# Patient Record
Sex: Male | Born: 1950
Health system: Southern US, Community
[De-identification: ages and names within clinical notes are randomized; demographics above are authoritative.]

## PROBLEM LIST (undated history)

## (undated) DIAGNOSIS — E785 Hyperlipidemia, unspecified: Secondary | ICD-10-CM

## (undated) DIAGNOSIS — M1990 Unspecified osteoarthritis, unspecified site: Secondary | ICD-10-CM

## (undated) DIAGNOSIS — Z9049 Acquired absence of other specified parts of digestive tract: Secondary | ICD-10-CM

## (undated) DIAGNOSIS — N138 Other obstructive and reflux uropathy: Secondary | ICD-10-CM

## (undated) DIAGNOSIS — Z973 Presence of spectacles and contact lenses: Secondary | ICD-10-CM

## (undated) DIAGNOSIS — I10 Essential (primary) hypertension: Secondary | ICD-10-CM

## (undated) DIAGNOSIS — T7840XA Allergy, unspecified, initial encounter: Secondary | ICD-10-CM

## (undated) DIAGNOSIS — F419 Anxiety disorder, unspecified: Secondary | ICD-10-CM

## (undated) DIAGNOSIS — I6521 Occlusion and stenosis of right carotid artery: Secondary | ICD-10-CM

## (undated) DIAGNOSIS — I739 Peripheral vascular disease, unspecified: Secondary | ICD-10-CM

## (undated) DIAGNOSIS — N401 Enlarged prostate with lower urinary tract symptoms: Secondary | ICD-10-CM

## (undated) DIAGNOSIS — N529 Male erectile dysfunction, unspecified: Secondary | ICD-10-CM

## (undated) DIAGNOSIS — G43909 Migraine, unspecified, not intractable, without status migrainosus: Secondary | ICD-10-CM

## (undated) DIAGNOSIS — M542 Cervicalgia: Secondary | ICD-10-CM

## (undated) HISTORY — DX: Anxiety disorder, unspecified: F41.9

## (undated) HISTORY — PX: COLON SURGERY: SHX602

## (undated) HISTORY — DX: Essential (primary) hypertension: I10

## (undated) HISTORY — PX: ENDOVASCULAR STENT INSERTION: SHX5161

## (undated) HISTORY — DX: Hyperlipidemia, unspecified: E78.5

## (undated) HISTORY — PX: PARTIAL COLECTOMY: SHX5273

## (undated) HISTORY — DX: Allergy, unspecified, initial encounter: T78.40XA

## (undated) HISTORY — PX: PILONIDAL CYST EXCISION: SHX744

## (undated) HISTORY — DX: Unspecified osteoarthritis, unspecified site: M19.90

## (undated) HISTORY — PX: COLOSTOMY REVERSAL: SHX5782

---

## 2010-12-20 HISTORY — PX: TOTAL KNEE ARTHROPLASTY: SHX125

## 2012-07-13 DIAGNOSIS — M1990 Unspecified osteoarthritis, unspecified site: Secondary | ICD-10-CM | POA: Insufficient documentation

## 2015-12-21 HISTORY — PX: PARTIAL KNEE ARTHROPLASTY: SHX2174

## 2017-01-26 LAB — HM COLONOSCOPY

## 2019-05-21 ENCOUNTER — Telehealth: Payer: Self-pay | Admitting: *Deleted

## 2019-05-21 NOTE — Telephone Encounter (Signed)
Copied from CRM [id number]. Topic: Appointment Scheduling - Scheduling Inquiry for Clinic >> May 21, 2019  1:26 PM Lorayne Bender wrote: Reason for CRM:   Pt new to area and would like to establish with PCP.  No preference of provider.  Pt has [employer].  Tried office 3x.

## 2019-05-24 ENCOUNTER — Ambulatory Visit (INDEPENDENT_AMBULATORY_CARE_PROVIDER_SITE_OTHER): Payer: PPO | Admitting: Family Medicine

## 2019-05-24 ENCOUNTER — Encounter: Payer: Self-pay | Admitting: Family Medicine

## 2019-05-24 DIAGNOSIS — E785 Hyperlipidemia, unspecified: Secondary | ICD-10-CM | POA: Diagnosis not present

## 2019-05-24 DIAGNOSIS — I10 Essential (primary) hypertension: Secondary | ICD-10-CM

## 2019-05-24 DIAGNOSIS — N400 Enlarged prostate without lower urinary tract symptoms: Secondary | ICD-10-CM | POA: Diagnosis not present

## 2019-05-24 DIAGNOSIS — L219 Seborrheic dermatitis, unspecified: Secondary | ICD-10-CM | POA: Diagnosis not present

## 2019-05-24 DIAGNOSIS — F419 Anxiety disorder, unspecified: Secondary | ICD-10-CM

## 2019-05-24 MED ORDER — FINASTERIDE 5 MG PO TABS: 5.0000 mg | ORAL_TABLET | Freq: Every day | ORAL | 1 refills | Status: DC

## 2019-05-24 MED ORDER — AMLODIPINE BESYLATE 10 MG PO TABS: 10.0000 mg | ORAL_TABLET | Freq: Every day | ORAL | 1 refills | Status: DC

## 2019-05-24 MED ORDER — TADALAFIL 20 MG PO TABS: 5.0000 mg | ORAL_TABLET | Freq: Every day | ORAL | 3 refills | Status: DC

## 2019-05-24 MED ORDER — TAMSULOSIN HCL 0.4 MG PO CAPS: 0.4000 mg | ORAL_CAPSULE | Freq: Every day | ORAL | 1 refills | Status: DC

## 2019-05-24 MED ORDER — ATORVASTATIN CALCIUM 40 MG PO TABS: 40.0000 mg | ORAL_TABLET | Freq: Every day | ORAL | 1 refills | Status: DC

## 2019-05-24 MED ORDER — TRIAMCINOLONE ACETONIDE 0.5 % EX CREA: 1.0000 "application " | TOPICAL_CREAM | Freq: Three times a day (TID) | CUTANEOUS | 1 refills | Status: DC

## 2019-05-24 MED ORDER — OXYBUTYNIN CHLORIDE ER 10 MG PO TB24: 10.0000 mg | ORAL_TABLET | Freq: Every day | ORAL | 1 refills | Status: DC

## 2019-05-24 MED ORDER — DOXEPIN HCL 25 MG PO CAPS: 25.0000 mg | ORAL_CAPSULE | Freq: Every day | ORAL | 1 refills | Status: DC

## 2019-05-24 NOTE — Assessment & Plan Note (Signed)
Stable.  Continue doxepin 25 mg daily.  Discussed potential changing agent however given its stability for several years, will continue for the time being.

## 2019-05-24 NOTE — Assessment & Plan Note (Signed)
Stable.  Continue triamcinolone as needed. 

## 2019-05-24 NOTE — Assessment & Plan Note (Signed)
Stable.  Continue Lipitor 40 mg daily.  Check lipid panel next blood draw. 

## 2019-05-24 NOTE — Assessment & Plan Note (Signed)
Symptoms are poorly controlled.  Continue current regimen of Flomax 0.4 mg daily, Proscar 5 mg daily, Cialis 5 mg daily, and oxybutynin 10 mg daily.  Will place referral to urology per patient request.

## 2019-05-24 NOTE — Assessment & Plan Note (Signed)
Stable.  Continue Norvasc 10 mg daily. 

## 2019-05-24 NOTE — Progress Notes (Signed)
Chief Complaint:  Jim Mathis is a 68 y.o. male who presents today for a virtual office visit with a chief complaint of BPH and to establish care.   Assessment/Plan:  BPH with Jim and OAB Symptoms are poorly controlled.  Continue current regimen of Flomax 0.4 mg daily, Proscar 5 mg daily, Cialis 5 mg daily, and oxybutynin 10 mg daily.  Will place referral to urology per patient request.  Essential hypertension Stable.  Continue Norvasc 10 mg daily.  Dyslipidemia Stable.  Continue Lipitor 40 mg daily.  Check lipid panel next blood draw.  Anxiety Stable.  Continue doxepin 25 mg daily.  Discussed potential changing agent however given its stability for several years, will continue for the time being.  Seborrheic dermatitis Stable.  Continue triamcinolone as needed.  Preventative Healthcare Patient was instructed to return soon for CPE. Health Maintenance Due  Topic Date Due  . Hepatitis C Screening  1951/09/01  . COLONOSCOPY  09/20/2000  . PNA vac Low Risk Adult (1 of 2 - PCV13) 09/21/2015        Subjective:  HPI:  His stable, chronic medical conditions are outlined below:  # Essential Hypertension - On Norvasc 10 mg daily tolerating well. - Home BPs: 130s/70s.  - ROS: No reported chest pain or shortness of breath  #Dyslipidemia -On Lipitor 40 mg daily and tolerating well -ROS: No reported myalgias  #BPH with Jim and OAB -On Flomax 0.4 mg daily, Cialis 5 mg daily, oxybutynin 10mg ,  and finasteride 5 mg daily. -Still has significant difficulty with urination.  Wakes up 5-6 times per night.  Interested in urological referral.  # Anxiety - On doxepin 25mg  nightly and tolerating well - ROS: No reported SI or HI.  #Seborrheic dermatitis - Uses triamcinolone cream as needed for outbreaks.  ROS: , otherwise a complete review of systems was negative.   PMH:  The following were reviewed and entered/updated in epic: Past Medical History:  Diagnosis Date  . BPH  (benign prostatic hyperplasia)   . Hyperlipidemia   . Hypertension    Patient Active Problem List   Diagnosis Date Noted  . BPH with Jim and OAB 05/24/2019  . Essential hypertension 05/24/2019  . Dyslipidemia 05/24/2019  . Anxiety 05/24/2019  . Seborrheic dermatitis 05/24/2019   Past Surgical History:  Procedure Laterality Date  . JOINT REPLACEMENT    . PARTIAL KNEE ARTHROPLASTY Left 2017  . REPLACEMENT TOTAL KNEE Right 2012  . SMALL INTESTINE SURGERY     s/p knee replacement    Family History  Problem Relation Age of Onset  . Alzheimer's disease Mother   . Lung cancer Father        non smoker   Medications- reviewed and updated Current Outpatient Medications  Medication Sig Dispense Refill  . amLODipine (NORVASC) 10 MG tablet Take 1 tablet (10 mg total) by mouth daily. 90 tablet 1  . amoxicillin (AMOXIL) 500 MG capsule Take 2,000mg  one hour prior to dental procedure    . aspirin EC 81 MG tablet Take 81 mg by mouth daily.    Marland Kitchen atorvastatin (LIPITOR) 40 MG tablet Take 1 tablet (40 mg total) by mouth daily. 90 tablet 1  . doxepin (SINEQUAN) 25 MG capsule Take 1 capsule (25 mg total) by mouth daily. 90 capsule 1  . finasteride (PROSCAR) 5 MG tablet Take 1 tablet (5 mg total) by mouth daily. 90 tablet 1  . oxybutynin (DITROPAN-XL) 10 MG 24 hr tablet Take 1 tablet (10 mg total)  by mouth at bedtime. 90 tablet 1  . tadalafil (CIALIS) 20 MG tablet Take 0.5 tablets (10 mg total) by mouth daily. 90 tablet 3  . tamsulosin (FLOMAX) 0.4 MG CAPS capsule Take 1 capsule (0.4 mg total) by mouth daily. 90 capsule 1  . triamcinolone cream (KENALOG) 0.5 % Apply 1 application topically 3 (three) times daily. Rash on chest 30 g 1   No current facility-administered medications for this visit.     Allergies-reviewed and updated No Known Allergies  Social History   Socioeconomic History  . Marital status: Married    Spouse name: Not on file  . Number of children: Not on file  . Years of  education: Not on file  . Highest education level: Not on file  Occupational History  . Not on file  Social Needs  . Financial resource strain: Not on file  . Food insecurity:    Worry: Not on file    Inability: Not on file  . Transportation needs:    Medical: Not on file    Non-medical: Not on file  Tobacco Use  . Smoking status: Former Smoker    Last attempt to quit: 05/23/1994    Years since quitting: 25.0  . Smokeless tobacco: Never Used  Substance and Sexual Activity  . Alcohol use: Yes    Comment: occasionally  . Drug use: Never  . Sexual activity: Yes  Lifestyle  . Physical activity:    Days per week: 4 days    Minutes per session: 40 min  . Stress: Not on file  Relationships  . Social connections:    Talks on phone: Not on file    Gets together: Not on file    Attends religious service: Not on file    Active member of club or organization: Not on file    Attends meetings of clubs or organizations: Not on file    Relationship status: Not on file  Other Topics Concern  . Not on file  Social History Narrative  . Not on file           Objective/Observations  Physical Exam: Gen: NAD, resting comfortably Eyes: Extraocular eye movements intact.  No scleral icterus Cardiovascular: No peripheral edema Pulm: Normal work of breathing MSK: Full range of motion in upper extremities.  No digital cyanosis. Skin: No rashes or lesions. Neuro: Grossly normal, moves all extremities Psych: Normal affect and thought content  Virtual Visit via Video   I connected with Jim Mathis on 05/24/19 at  3:00 PM EDT by a video enabled telemedicine application and verified that I am speaking with the correct person using two identifiers. I discussed the limitations of evaluation and management by telemedicine and the availability of in person appointments. The patient expressed understanding and agreed to proceed.   Patient location: Home Provider location: [place]  Office Persons participating in the virtual visit: Myself and Patient     Katina DegreeCaleb M. Jimmey RalphParker, MD 05/24/2019 4:31 PM

## 2019-05-28 ENCOUNTER — Encounter: Payer: Self-pay | Admitting: Family Medicine

## 2019-05-29 NOTE — Telephone Encounter (Signed)
Mr. Parodi called office to schedule an appt but office was closed. / please advise

## 2019-05-30 ENCOUNTER — Encounter: Payer: Self-pay | Admitting: Family Medicine

## 2019-05-30 ENCOUNTER — Ambulatory Visit (INDEPENDENT_AMBULATORY_CARE_PROVIDER_SITE_OTHER): Payer: PPO | Admitting: Family Medicine

## 2019-05-30 ENCOUNTER — Other Ambulatory Visit: Payer: Self-pay

## 2019-05-30 DIAGNOSIS — R51 Headache: Secondary | ICD-10-CM | POA: Diagnosis not present

## 2019-05-30 DIAGNOSIS — R200 Anesthesia of skin: Secondary | ICD-10-CM

## 2019-05-30 DIAGNOSIS — R519 Headache, unspecified: Secondary | ICD-10-CM

## 2019-05-30 MED ORDER — OXYBUTYNIN CHLORIDE ER 10 MG PO TB24: 10.0000 mg | ORAL_TABLET | Freq: Every day | ORAL | 1 refills | Status: DC

## 2019-05-30 MED ORDER — FINASTERIDE 5 MG PO TABS: 5.0000 mg | ORAL_TABLET | Freq: Every day | ORAL | 1 refills | Status: DC

## 2019-05-30 MED ORDER — ATORVASTATIN CALCIUM 40 MG PO TABS: 40.0000 mg | ORAL_TABLET | Freq: Every day | ORAL | 1 refills | Status: DC

## 2019-05-30 MED ORDER — AMLODIPINE BESYLATE 10 MG PO TABS: 10.0000 mg | ORAL_TABLET | Freq: Every day | ORAL | 1 refills | Status: DC

## 2019-05-30 MED ORDER — TRIAMCINOLONE ACETONIDE 0.5 % EX CREA: 1.0000 "application " | TOPICAL_CREAM | Freq: Three times a day (TID) | CUTANEOUS | 1 refills | Status: DC

## 2019-05-30 MED ORDER — DOXEPIN HCL 25 MG PO CAPS: 25.0000 mg | ORAL_CAPSULE | Freq: Every day | ORAL | 1 refills | Status: DC

## 2019-05-30 MED ORDER — TAMSULOSIN HCL 0.4 MG PO CAPS: 0.4000 mg | ORAL_CAPSULE | Freq: Every day | ORAL | 1 refills | Status: DC

## 2019-05-30 NOTE — Progress Notes (Signed)
° ° °  Chief Complaint:  Jim Mathis is a 68 y.o. male who presents today for a virtual office visit with a chief complaint of headache.   Assessment/Plan:  Headache / Right Upper extremity numbness. These are both chronic problems, new to provider. Possibly consistent with complex migraine given his concurrent blurred vision and history of migraines in the past.  He also has some associated right upper extremity numbness/tingling and right lower extremity numbness/tingling.  He has had a carotid ultrasound done which was negative.  Has also had some x-rays done which reportedly showed degenerative changes in his neck with some nerve compression.  It is possible that he could have some degenerative changes in his neck which is contributing to his headaches/migraines.  Discussed management options with patient at this point including imaging and referral to neurology.  Patient preferred neurology referral.  This is placed today.  Discussed reasons to return to care and seek emergent care.     Subjective:  HPI:  Headaches Symptoms started about 3 years ago.  He has been seen by his previous PCP for this and has had work-up including a few x-rays and a carotid ultrasound.  Describes his headaches as mild.  They originate in the back of his head and then radiate towards the front.  They tend to be constant in nature.  He has had migraines in the past and does not feel like his headaches are as severe as his previous migraines.  He has had some associated peripheral blurred vision with his headaches.  Symptoms last pretty much all day and then subside.  He has taken high-dose naproxen which seems to help.  He has also had some associated numbness and tingling in his right upper extremity and right lower extremity for the past few years as well.  These symptoms only occur with exertion.  These were not associated with his headaches.  He will go for brisk walk and start having symptoms.  Symptoms then subside  after coming to rest.  No obvious precipitating events.  No other obvious alleviating or aggravating factors.  Symptoms are becoming more frequent.  His right lower extremity numbness became more frequent after undergoing total knee replacement a few years ago.  Now has them occur 4-6 times per month.   ROS: Per HPI  PMH: He reports that he quit smoking about 25 years ago. He has never used smokeless tobacco. He reports current alcohol use. He reports that he does not use drugs.      Objective/Observations  Physical Exam: Gen: NAD, resting comfortably Pulm: Normal work of breathing Neuro: Grossly normal, moves all extremities Psych: Normal affect and thought content  Virtual Visit via Video   I connected with Ed Gaige on 05/30/19 at 10:20 AM EDT by a video enabled telemedicine application and verified that I am speaking with the correct person using two identifiers. I discussed the limitations of evaluation and management by telemedicine and the availability of in person appointments. The patient expressed understanding and agreed to proceed.   Patient location: Home Provider location: [place] participating in the virtual visit: Myself and Patient     Algis Greenhouse. Jerline Pain, MD 05/30/2019 10:22 AM

## 2019-06-06 ENCOUNTER — Encounter: Payer: Self-pay | Admitting: Neurology

## 2019-06-06 NOTE — Progress Notes (Signed)
Virtual Visit via Video Note The purpose of this virtual visit is to provide medical care while limiting exposure to the novel coronavirus.    Consent was obtained for video visit:  yes Answered questions that patient had about telehealth interaction:  yes I discussed the limitations, risks, security and privacy concerns of performing an evaluation and management service by telemedicine. I also discussed with the patient that there may be a patient responsible charge related to this service. The patient expressed understanding and agreed to proceed.  Pt location: Home Physician Location: Office Name of referring provider:  Ardith DarkParker, Caleb M, MD I connected with Jim MontesEdward Egle Mathis. at patients initiation/request on 06/07/2019 at  2:50 PM EDT by video enabled telemedicine application and verified that I am speaking with the correct person using two identifiers. Pt MRN:  [id number] Pt DOB:  01/19/1951 Video Participants:  Jim MontesEdward Ewalt Mathis.   History of Present Illness:  Jim Mathis is a 68 year old  Caucasian man who presents for headache and right upper extremity numbness.  History supplemented by referring provider note.  He began having headaches around 2017 and has been getting more severe.  He does report past history of migraines in his 10740s but these headaches are not as severe.  These headaches are 6-7/10.  The start in the back of the head and radiate in band-like distribution, usually non-throbbing.  Headaches are preceded by aggravation of neck pain.  Sometimes they are preceded by a visual "cloud burst" with peripheral vision loss lasting 10-15 minutes.  They typically last a day and have been occurring every other day.  There is some photophobia and phonophobia but no nausea or vomiting.  They are fairly successfully treated with naproxen or acetaminophen.    He has left sided neck pain but will develop numbness in the right hand, predominantly the 4th and 5th digits.  He also  endorses back pain.  When he walks, his right foot becomes numb as well.  No associated radicular pain or weakness.    Workup reportedly included X-rays of the cervical spine which reportedly showed "degenerative changes and "nerve compression"  He had a carotid ultrasound performed in March-February which demonstrated 50-69% stenosis in the right ICA, no hemodynamically significant stenosis in the left ICA.  Incidentally, a 17 mm nodule in the right lobe of the thyroid was seen.  Current NSAIDS:  ASA 81mg  daily, naproxen Current analgesics:  Tylenol Current triptans:  none Current ergotamine:  none Current anti-emetic:  none Current muscle relaxants:  none Current anti-anxiolytic:  none Current sleep aide:  none Current Antihypertensive medications:  amlodipine Current Antidepressant medications:  Doxepin 25mg  Current Anticonvulsant medications:  none Current anti-CGRP:  none Current Vitamins/Herbal/Supplements:  none Current Antihistamines/Decongestants:  none Other therapy:  none Other medications:  atorvastatin  Past NSAIDS:  ibuprofen Past analgesics:  Fiorinal (many years ago for old migraines) Past abortive triptans:  none Past abortive ergotamine:  none Past muscle relaxants:  none Past anti-emetic:  none Past antihypertensive medications:  none Past antidepressant medications:  none Past anticonvulsant medications:  none Past anti-CGRP:  none Past vitamins/Herbal/Supplements:  none Past antihistamines/decongestants:  none Other past therapies:  none  Past Medical History: Past Medical History:  Diagnosis Date  . BPH (benign prostatic hyperplasia)   . Hyperlipidemia   . Hypertension     Medications: Outpatient Encounter Medications as of 06/07/2019  Medication Sig  . amLODipine (NORVASC) 10 MG tablet Take 1 tablet (10 mg total) by mouth  daily.  . amoxicillin (AMOXIL) 500 MG capsule Take 2,000mg  one hour prior to dental procedure  . aspirin EC 81 MG tablet Take  81 mg by mouth daily.  Marland Kitchen atorvastatin (LIPITOR) 40 MG tablet Take 1 tablet (40 mg total) by mouth daily.  Marland Kitchen doxepin (SINEQUAN) 25 MG capsule Take 1 capsule (25 mg total) by mouth daily.  . finasteride (PROSCAR) 5 MG tablet Take 1 tablet (5 mg total) by mouth daily.  Marland Kitchen oxybutynin (DITROPAN-XL) 10 MG 24 hr tablet Take 1 tablet (10 mg total) by mouth at bedtime.  . tadalafil (CIALIS) 20 MG tablet Take 0.5 tablets (10 mg total) by mouth daily.  . tamsulosin (FLOMAX) 0.4 MG CAPS capsule Take 1 capsule (0.4 mg total) by mouth daily.  Marland Kitchen triamcinolone cream (KENALOG) 0.5 % Apply 1 application topically 3 (three) times daily. Rash on chest   No facility-administered encounter medications on file as of 06/07/2019.     Allergies: No Known Allergies  Family History: Family History  Problem Relation Age of Onset  . Alzheimer's disease Mother   . Lung cancer Father        non smoker    Social History: Social History   Socioeconomic History  . Marital status: Married    Spouse name: Not on file  . Number of children: Not on file  . Years of education: Not on file  . Highest education level: Not on file  Occupational History  . Not on file  Social Needs  . Financial resource strain: Not on file  . Food insecurity    Worry: Not on file    Inability: Not on file  . Transportation needs    Medical: Not on file    Non-medical: Not on file  Tobacco Use  . Smoking status: Former Smoker    Quit date: 05/23/1994    Years since quitting: 25.0  . Smokeless tobacco: Never Used  Substance and Sexual Activity  . Alcohol use: Yes    Comment: occasionally  . Drug use: Never  . Sexual activity: Yes  Lifestyle  . Physical activity    Days per week: 4 days    Minutes per session: 40 min  . Stress: Not on file  Relationships  . Social Herbalist on phone: Not on file    Gets together: Not on file    Attends religious service: Not on file    Active member of club or organization: Not  on file    Attends meetings of clubs or organizations: Not on file    Relationship status: Not on file  . Intimate partner violence    Fear of current or ex partner: Not on file    Emotionally abused: Not on file    Physically abused: Not on file    Forced sexual activity: Not on file  Other Topics Concern  . Not on file  Social History Narrative  . Not on file   Observations/Objective:   Height 5\' 10"  (1.778 m), weight 190 lb (86.2 kg). No acute distress.  Alert and oriented.  Speech fluent and not dysarthric.  Language intact.    Assessment and Plan:   1.  Cervicogenic migraines/Chronic migraine without aura, without status migrainosus, not intractable/migraine with aura, without status migrainosus, not intractable 2. Cervicalgia, cervical radiculopathy 3.  Low back pain, lumbar radiculopathy 4.  Right carotid artery stenosis  1.  To address neck pain and headache, start gabapentin: 300mg  at bedtime for a week, then  300mg  twice daily. 2.  At onset of aggravated neck pain, may take tizanidine 2mg  to try and prevent migraine (cautioned about drowsiness).  May use naproxen 3.  Limit use of pain relievers to no more than 2 days out of week to prevent risk of rebound or medication-overuse headache. 4.  Check MRI of cervical spine to evaluate for etiology of neck pain and radiculopathy 5.  NCV-EMG of right upper and lower extremities 6.  Will need to repeat carotid US annually (next in February-March 2021).  Continue ASA 81mg  and atorvastatin daily 7.  Follow up in 4 months.  Follow Up Instructions:    -I discussed the assessment and treatment plan with the patient. The patient was provided an opportunity to ask questions and all were answered. The patient agreed with the plan and demonstrated an understanding of the instructions.   The patient was advised to call back or seek an in-person evaluation if the symptoms worsen or if the condition fails to improve as anticipated.  Cira ServantAdam  Robert Savi Lastinger, DO

## 2019-06-07 ENCOUNTER — Telehealth (INDEPENDENT_AMBULATORY_CARE_PROVIDER_SITE_OTHER): Payer: PPO | Admitting: Neurology

## 2019-06-07 ENCOUNTER — Encounter: Payer: Self-pay | Admitting: Neurology

## 2019-06-07 ENCOUNTER — Other Ambulatory Visit: Payer: Self-pay

## 2019-06-07 VITALS — Ht 70.0 in | Wt 190.0 lb

## 2019-06-07 DIAGNOSIS — G43109 Migraine with aura, not intractable, without status migrainosus: Secondary | ICD-10-CM

## 2019-06-07 DIAGNOSIS — G43709 Chronic migraine without aura, not intractable, without status migrainosus: Secondary | ICD-10-CM

## 2019-06-07 DIAGNOSIS — I6521 Occlusion and stenosis of right carotid artery: Secondary | ICD-10-CM

## 2019-06-07 DIAGNOSIS — M542 Cervicalgia: Secondary | ICD-10-CM

## 2019-06-07 DIAGNOSIS — M5416 Radiculopathy, lumbar region: Secondary | ICD-10-CM

## 2019-06-07 DIAGNOSIS — M5412 Radiculopathy, cervical region: Secondary | ICD-10-CM

## 2019-06-07 MED ORDER — TIZANIDINE HCL 2 MG PO TABS: 2.0000 mg | ORAL_TABLET | Freq: Four times a day (QID) | ORAL | 0 refills | Status: DC | PRN

## 2019-06-07 MED ORDER — GABAPENTIN 300 MG PO CAPS: ORAL_CAPSULE | ORAL | 0 refills | Status: DC

## 2019-06-07 NOTE — Addendum Note (Signed)
Addended byTomi Likens, Sherlon Nied R on: 06/07/2019 03:15 PM   Modules accepted: Orders

## 2019-06-07 NOTE — Addendum Note (Signed)
Addended by: Clois Comber on: 06/07/2019 03:23 PM   Modules accepted: Orders

## 2019-06-20 ENCOUNTER — Encounter: Payer: Self-pay | Admitting: Family Medicine

## 2019-07-02 ENCOUNTER — Ambulatory Visit
Admission: RE | Admit: 2019-07-02 | Discharge: 2019-07-02 | Disposition: A | Discharge status: Home / Self Care | Payer: PPO | Source: Ambulatory Visit | Attending: Neurology | Admitting: Neurology

## 2019-07-02 ENCOUNTER — Other Ambulatory Visit: Payer: Self-pay

## 2019-07-02 DIAGNOSIS — M452 Ankylosing spondylitis of cervical region: Secondary | ICD-10-CM | POA: Diagnosis not present

## 2019-07-02 DIAGNOSIS — M5412 Radiculopathy, cervical region: Secondary | ICD-10-CM

## 2019-07-02 DIAGNOSIS — M542 Cervicalgia: Secondary | ICD-10-CM

## 2019-07-03 ENCOUNTER — Ambulatory Visit (INDEPENDENT_AMBULATORY_CARE_PROVIDER_SITE_OTHER): Payer: PPO | Admitting: Neurology

## 2019-07-03 DIAGNOSIS — G5621 Lesion of ulnar nerve, right upper limb: Secondary | ICD-10-CM

## 2019-07-03 DIAGNOSIS — M5412 Radiculopathy, cervical region: Secondary | ICD-10-CM | POA: Diagnosis not present

## 2019-07-03 DIAGNOSIS — G5601 Carpal tunnel syndrome, right upper limb: Secondary | ICD-10-CM

## 2019-07-03 NOTE — Procedures (Addendum)
[facility]  [address]  [place], [place] [address] Tel: [phone] Fax:  [phone] Test Date:  07/03/2019  Patient: Jim Mathis, Jim Mathis. DOB: [birth year] Physician: Narda Amber, DO  Sex: Male Height: 5\' 10"  Ref Phys: Metta Clines, DO  ID#: [id number] Temp: 33.0C Technician:    Patient Complaints: This is a 68 year old man referred for evaluation of right fourth and fifth finger numbness and episodic right foot numbness.  NCV & EMG Findings: Extensive electrodiagnostic testing of the right upper and lower extremities shows: 1. Right mixed palmar sensory responses show mildly prolonged latency.  Right median, ulnar, sural, and superficial peroneal sensory responses are within normal limits. 2. Right ulnar motor response shows slowed conduction velocity across the elbow (A Elbow-B Elbow, 45 m/s).  Right median, peroneal, and tibial motor responses are within normal limits.   3. Right tibial H reflex study is within normal limits.   4. There is no evidence of active or chronic motor axonal loss changes affecting any of the tested muscles.  Motor unit configuration and recruitment pattern is within normal limits.    Impression: 1. Right ulnar neuropathy with slowing across the elbow, purely demyelinating in type and mild in degree electrically. 2. Right median neuropathy at or distal to the wrist (very mild), consistent with a clinical diagnosis of carpal tunnel syndrome. 3. There is no evidence of a cervical/lumbosacral radiculopathy or sensorimotor polyneuropathy affecting the right side.   ___________________________ Narda Amber, DO    Nerve Conduction Studies Anti Sensory Summary Table   Site NR Peak (ms) Norm Peak (ms) P-T Amp (V) Norm P-T Amp  Right Median Anti Sensory (2nd Digit)  33C  Wrist    3.0 <3.8 16.8 >10  Right Sup Peroneal Anti Sensory (Ant Lat Mall)  33C  12 cm    2.1 <4.6 10.4 >3  Right Sural Anti Sensory (Lat Mall)  33C  Calf     2.6 <4.6 11.6 >3  Right Ulnar Anti Sensory (5th Digit)  33C  Wrist    2.4 <3.2 24.7 >5   Motor Summary Table   Site NR Onset (ms) Norm Onset (ms) O-P Amp (mV) Norm O-P Amp Site1 Site2 Delta-0 (ms) Dist (cm) Vel (m/s) Norm Vel (m/s)  Right Median Motor (Abd Poll Brev)  33C  Wrist    2.5 <4.0 10.9 >5 Elbow Wrist 5.3 31.0 58 >50  Elbow    7.8  10.1         Right Peroneal Motor (Ext Dig Brev)  33C  Ankle    2.8 <6.0 3.5 >2.5 B Fib Ankle 7.8 39.0 50 >40  B Fib    10.6  3.0  Poplt B Fib 1.5 9.0 60 >40  Poplt    12.1  2.8         Right Tibial Motor (Abd Hall Brev)  33C  Ankle    4.2 <6.0 9.8 >4 Knee Ankle 8.1 42.0 52 >40  Knee    12.3  5.7         Right Ulnar Motor (Abd Dig Minimi)  33C  Wrist    2.0 <3.1 8.0 >7 B Elbow Wrist 3.7 23.0 62 >50  B Elbow    5.7  7.2  A Elbow B Elbow 2.2 10.0 45 >50  A Elbow    7.9  6.8          Comparison Summary Table   Site NR Peak (ms) Norm Peak (ms) P-T Amp (V) Site1 Site2  Delta-P (ms) Norm Delta (ms)  Right Median/Ulnar Palm Comparison (Wrist - 8cm)  33C  Median Palm    1.8 <2.2 18.1 Median Palm Ulnar Palm 0.5   Ulnar Palm    1.3 <2.2 12.5       H Reflex Studies   NR H-Lat (ms) Lat Norm (ms) L-R H-Lat (ms)  Right Tibial (Gastroc)  33C     34.01 <35    EMG   Side Muscle Ins Act Fibs Psw Fasc Number Recrt Dur Dur. Amp Amp. Poly Poly. Comment  Right AntTibialis Nml Nml Nml Nml Nml Nml Nml Nml Nml Nml Nml Nml N/A  Right Gastroc Nml Nml Nml Nml Nml Nml Nml Nml Nml Nml Nml Nml N/A  Right Flex Dig Long Nml Nml Nml Nml Nml Nml Nml Nml Nml Nml Nml Nml N/A  Right RectFemoris Nml Nml Nml Nml Nml Nml Nml Nml Nml Nml Nml Nml N/A  Right GluteusMed Nml Nml Nml Nml Nml Nml Nml Nml Nml Nml Nml Nml N/A  Right 1stDorInt Nml Nml Nml Nml Nml Nml Nml Nml Nml Nml Nml Nml N/A  Right Abd Poll Brev Nml Nml Nml Nml Nml Nml Nml Nml Nml Nml Nml Nml N/A  Right PronatorTeres Nml Nml Nml Nml Nml Nml Nml Nml Nml Nml Nml Nml N/A  Right Biceps Nml Nml Nml Nml Nml Nml  Nml Nml Nml Nml Nml Nml N/A  Right Triceps Nml Nml Nml Nml Nml Nml Nml Nml Nml Nml Nml Nml N/A  Right Deltoid Nml Nml Nml Nml Nml Nml Nml Nml Nml Nml Nml Nml N/A  Right FlexCarpiUln Nml Nml Nml Nml Nml Nml Nml Nml Nml Nml Nml Nml N/A      Waveforms:

## 2019-07-10 ENCOUNTER — Encounter: Payer: Self-pay | Admitting: Family Medicine

## 2019-07-24 DIAGNOSIS — R3915 Urgency of urination: Secondary | ICD-10-CM | POA: Diagnosis not present

## 2019-07-24 DIAGNOSIS — N5201 Erectile dysfunction due to arterial insufficiency: Secondary | ICD-10-CM | POA: Diagnosis not present

## 2019-07-24 DIAGNOSIS — Z125 Encounter for screening for malignant neoplasm of prostate: Secondary | ICD-10-CM | POA: Diagnosis not present

## 2019-08-03 DIAGNOSIS — R3915 Urgency of urination: Secondary | ICD-10-CM | POA: Diagnosis not present

## 2019-08-06 ENCOUNTER — Other Ambulatory Visit: Payer: Self-pay | Admitting: Urology

## 2019-08-13 ENCOUNTER — Other Ambulatory Visit (HOSPITAL_COMMUNITY)
Admission: RE | Admit: 2019-08-13 | Discharge: 2019-08-13 | Disposition: A | Discharge status: Home / Self Care | Payer: PPO | Source: Ambulatory Visit | Attending: Urology | Admitting: Urology

## 2019-08-13 DIAGNOSIS — N400 Enlarged prostate without lower urinary tract symptoms: Secondary | ICD-10-CM | POA: Insufficient documentation

## 2019-08-13 DIAGNOSIS — Z01812 Encounter for preprocedural laboratory examination: Secondary | ICD-10-CM | POA: Diagnosis not present

## 2019-08-13 DIAGNOSIS — Z20828 Contact with and (suspected) exposure to other viral communicable diseases: Secondary | ICD-10-CM | POA: Diagnosis not present

## 2019-08-13 LAB — SARS CORONAVIRUS 2 (TAT 6-24 HRS): SARS Coronavirus 2: NEGATIVE

## 2019-08-14 ENCOUNTER — Encounter (HOSPITAL_BASED_OUTPATIENT_CLINIC_OR_DEPARTMENT_OTHER): Payer: Self-pay | Admitting: *Deleted

## 2019-08-14 ENCOUNTER — Other Ambulatory Visit: Payer: Self-pay

## 2019-08-14 MED ORDER — GENTAMICIN SULFATE 40 MG/ML IJ SOLN
440.0000 mg | INTRAVENOUS | Status: AC
  Administered 2019-08-15: 13:00:00 440 mg via INTRAVENOUS
  Filled 2019-08-14 (×2): qty 11

## 2019-08-14 NOTE — Anesthesia Preprocedure Evaluation (Addendum)
Anesthesia Evaluation  Patient identified by MRN, date of birth, ID band Patient awake    Reviewed: Allergy & Precautions, H&P , NPO status , Patient's Chart, lab work & pertinent test results, reviewed documented beta blocker date and time   Airway Mallampati: I  TM Distance: >3 FB Neck ROM: full    Dental no notable dental hx. (+) Teeth Intact, Dental Advisory Given   Pulmonary neg pulmonary ROS, former smoker,    Pulmonary exam normal breath sounds clear to auscultation       Cardiovascular Exercise Tolerance: Good hypertension, Pt. on medications  Rhythm:regular Rate:Normal     Neuro/Psych  Headaches, Anxiety carotid ultrasound result right ICA 50-69%  negative psych ROS   GI/Hepatic negative GI ROS, Neg liver ROS,   Endo/Other  negative endocrine ROS  Renal/GU negative Renal ROS  negative genitourinary   Musculoskeletal  (+) Arthritis , Osteoarthritis,    Abdominal   Peds  Hematology negative hematology ROS (+)   Anesthesia Other Findings   Reproductive/Obstetrics negative OB ROS                           Anesthesia Physical Anesthesia Plan  ASA: II  Anesthesia Plan: General   Post-op Pain Management:    Induction: Intravenous  PONV Risk Score and Plan: Ondansetron  Airway Management Planned: Oral ETT and LMA  Additional Equipment:   Intra-op Plan:   Post-operative Plan: Extubation in OR  Informed Consent: I have reviewed the patients History and Physical, chart, labs and discussed the procedure including the risks, benefits and alternatives for the proposed anesthesia with the patient or authorized representative who has indicated his/her understanding and acceptance.     Dental Advisory Given  Plan Discussed with: CRNA, Anesthesiologist and Surgeon  Anesthesia Plan Comments: (  )       Anesthesia Quick Evaluation

## 2019-08-14 NOTE — Progress Notes (Addendum)
Spoke w/ pt via phone for pre-op interview.  Npo after mn w/ exception clear liquids until 0800 then nothing by mouth, pt verbalized understanding.  Arrive at 1200. Need istat and ekg.  Pt had covid test done 08-11-2019.  Will take norvasc and lipitor am dos w/ sips of water.  Reviewed [facility] guidelines and visitor restriction policy.  Asked pt to bring home medications.  Chart to be reviewed by anesthesia, Konrad Felix PA.    PCP -  dr Dimas Chyle Cardiologist -  no  Chest x-ray -  no EKG -  in care everywhere 2011 Stress Test -  no ECHO -  no Cardiac Cath -  no  Sleep Study - no CPAP -  no  Fasting Blood Sugar - n/a Checks Blood Sugar _____ times a day  Blood Thinner Instructions:  ASA Aspirin Instructions:  given by dr Tresa Moore office to stop.   Last Dose:  pt stated last dose 08-10-2019  Anesthesia review:   pt denies any cardiac s&s.  Pt denies any peripheral claudication or extremity swelling.  Pt s/p peripheral stenting bilateral legs in state of [place], 2003 and 2007. No follow up with vascular since moved to [place] or testing by pcp.   ADDENDUM:  Per anesthesia ok to proceed, Konrad Felix PA.

## 2019-08-15 ENCOUNTER — Ambulatory Visit (HOSPITAL_BASED_OUTPATIENT_CLINIC_OR_DEPARTMENT_OTHER)
Admission: RE | Admit: 2019-08-15 | Discharge: 2019-08-16 | Disposition: A | Discharge status: Home / Self Care | Payer: PPO | Attending: Urology | Admitting: Urology

## 2019-08-15 ENCOUNTER — Encounter (HOSPITAL_BASED_OUTPATIENT_CLINIC_OR_DEPARTMENT_OTHER)
Admission: RE | Disposition: A | Discharge status: Home / Self Care | Payer: Self-pay | Source: Home / Self Care | Attending: Urology

## 2019-08-15 ENCOUNTER — Ambulatory Visit (HOSPITAL_BASED_OUTPATIENT_CLINIC_OR_DEPARTMENT_OTHER): Payer: PPO | Admitting: Anesthesiology

## 2019-08-15 ENCOUNTER — Encounter (HOSPITAL_BASED_OUTPATIENT_CLINIC_OR_DEPARTMENT_OTHER): Payer: Self-pay

## 2019-08-15 ENCOUNTER — Other Ambulatory Visit: Payer: Self-pay

## 2019-08-15 DIAGNOSIS — N401 Enlarged prostate with lower urinary tract symptoms: Secondary | ICD-10-CM | POA: Insufficient documentation

## 2019-08-15 DIAGNOSIS — Z7982 Long term (current) use of aspirin: Secondary | ICD-10-CM | POA: Diagnosis not present

## 2019-08-15 DIAGNOSIS — Z9049 Acquired absence of other specified parts of digestive tract: Secondary | ICD-10-CM | POA: Diagnosis not present

## 2019-08-15 DIAGNOSIS — I739 Peripheral vascular disease, unspecified: Secondary | ICD-10-CM | POA: Insufficient documentation

## 2019-08-15 DIAGNOSIS — N400 Enlarged prostate without lower urinary tract symptoms: Secondary | ICD-10-CM | POA: Diagnosis present

## 2019-08-15 DIAGNOSIS — N138 Other obstructive and reflux uropathy: Secondary | ICD-10-CM | POA: Diagnosis not present

## 2019-08-15 DIAGNOSIS — I10 Essential (primary) hypertension: Secondary | ICD-10-CM | POA: Diagnosis not present

## 2019-08-15 DIAGNOSIS — N529 Male erectile dysfunction, unspecified: Secondary | ICD-10-CM | POA: Insufficient documentation

## 2019-08-15 DIAGNOSIS — F419 Anxiety disorder, unspecified: Secondary | ICD-10-CM | POA: Diagnosis not present

## 2019-08-15 DIAGNOSIS — Z87891 Personal history of nicotine dependence: Secondary | ICD-10-CM | POA: Insufficient documentation

## 2019-08-15 DIAGNOSIS — Z79899 Other long term (current) drug therapy: Secondary | ICD-10-CM | POA: Insufficient documentation

## 2019-08-15 DIAGNOSIS — N3281 Overactive bladder: Secondary | ICD-10-CM | POA: Diagnosis not present

## 2019-08-15 DIAGNOSIS — E785 Hyperlipidemia, unspecified: Secondary | ICD-10-CM | POA: Insufficient documentation

## 2019-08-15 DIAGNOSIS — Z96653 Presence of artificial knee joint, bilateral: Secondary | ICD-10-CM | POA: Diagnosis not present

## 2019-08-15 DIAGNOSIS — I6521 Occlusion and stenosis of right carotid artery: Secondary | ICD-10-CM | POA: Diagnosis not present

## 2019-08-15 HISTORY — DX: Male erectile dysfunction, unspecified: N52.9

## 2019-08-15 HISTORY — DX: Cervicalgia: M54.2

## 2019-08-15 HISTORY — DX: Peripheral vascular disease, unspecified: I73.9

## 2019-08-15 HISTORY — DX: Migraine, unspecified, not intractable, without status migrainosus: G43.909

## 2019-08-15 HISTORY — DX: Presence of spectacles and contact lenses: Z97.3

## 2019-08-15 HISTORY — DX: Occlusion and stenosis of right carotid artery: I65.21

## 2019-08-15 HISTORY — DX: Other obstructive and reflux uropathy: N13.8

## 2019-08-15 HISTORY — PX: TRANSURETHRAL RESECTION OF PROSTATE: SHX73

## 2019-08-15 HISTORY — DX: Acquired absence of other specified parts of digestive tract: Z90.49

## 2019-08-15 HISTORY — DX: Other obstructive and reflux uropathy: N40.1

## 2019-08-15 LAB — POCT I-STAT, CHEM 8
BUN: 18 mg/dL (ref 8–23)
Calcium, Ion: 1.26 mmol/L (ref 1.15–1.40)
Chloride: 105 mmol/L (ref 98–111)
Creatinine, Ser: 0.8 mg/dL (ref 0.61–1.24)
Glucose, Bld: 89 mg/dL (ref 70–99)
HCT: 45 % (ref 39.0–52.0)
Hemoglobin: 15.3 g/dL (ref 13.0–17.0)
Potassium: 4.5 mmol/L (ref 3.5–5.1)
Sodium: 140 mmol/L (ref 135–145)
TCO2: 25 mmol/L (ref 22–32)

## 2019-08-15 SURGERY — TURP (TRANSURETHRAL RESECTION OF PROSTATE)
Anesthesia: General | Site: Prostate

## 2019-08-15 MED ORDER — EPHEDRINE 5 MG/ML INJ
INTRAVENOUS | Status: AC
  Filled 2019-08-15: qty 10

## 2019-08-15 MED ORDER — SODIUM CHLORIDE 0.9 % IR SOLN
3000.0000 mL | Status: DC
  Administered 2019-08-15 (×2): 3000 mL
  Filled 2019-08-15: qty 3000

## 2019-08-15 MED ORDER — MIDAZOLAM HCL 2 MG/2ML IJ SOLN
INTRAMUSCULAR | Status: AC
  Filled 2019-08-15: qty 2

## 2019-08-15 MED ORDER — SODIUM CHLORIDE 0.9 % IV SOLN
INTRAVENOUS | Status: DC
  Filled 2019-08-15: qty 1000

## 2019-08-15 MED ORDER — FENTANYL CITRATE (PF) 100 MCG/2ML IJ SOLN
25.0000 ug | INTRAMUSCULAR | Status: DC | PRN
  Administered 2019-08-15 (×2): 50 ug via INTRAVENOUS
  Filled 2019-08-15: qty 1

## 2019-08-15 MED ORDER — ONDANSETRON HCL 4 MG/2ML IJ SOLN
INTRAMUSCULAR | Status: DC | PRN
  Administered 2019-08-15: 4 mg via INTRAVENOUS

## 2019-08-15 MED ORDER — LIDOCAINE 2% (20 MG/ML) 5 ML SYRINGE
INTRAMUSCULAR | Status: DC | PRN
  Administered 2019-08-15: 50 mg via INTRAVENOUS

## 2019-08-15 MED ORDER — ONDANSETRON HCL 4 MG/2ML IJ SOLN
4.0000 mg | Freq: Once | INTRAMUSCULAR | Status: DC | PRN
  Filled 2019-08-15: qty 2

## 2019-08-15 MED ORDER — MEPERIDINE HCL 25 MG/ML IJ SOLN
6.2500 mg | INTRAMUSCULAR | Status: DC | PRN
  Filled 2019-08-15: qty 1

## 2019-08-15 MED ORDER — PROPOFOL 10 MG/ML IV BOLUS
INTRAVENOUS | Status: DC | PRN
  Administered 2019-08-15: 150 mg via INTRAVENOUS

## 2019-08-15 MED ORDER — FINASTERIDE 5 MG PO TABS
5.0000 mg | ORAL_TABLET | Freq: Every day | ORAL | Status: DC
  Filled 2019-08-15 (×2): qty 1

## 2019-08-15 MED ORDER — EPHEDRINE SULFATE-NACL 50-0.9 MG/10ML-% IV SOSY
PREFILLED_SYRINGE | INTRAVENOUS | Status: DC | PRN
  Administered 2019-08-15 (×2): 10 mg via INTRAVENOUS

## 2019-08-15 MED ORDER — HYDROMORPHONE HCL 1 MG/ML IJ SOLN
0.5000 mg | INTRAMUSCULAR | Status: DC | PRN
  Filled 2019-08-15: qty 1

## 2019-08-15 MED ORDER — SENNOSIDES-DOCUSATE SODIUM 8.6-50 MG PO TABS
1.0000 | ORAL_TABLET | Freq: Two times a day (BID) | ORAL | Status: DC
  Administered 2019-08-15 – 2019-08-16 (×2): 1 via ORAL
  Filled 2019-08-15 (×3): qty 1

## 2019-08-15 MED ORDER — DEXAMETHASONE SODIUM PHOSPHATE 10 MG/ML IJ SOLN
INTRAMUSCULAR | Status: AC
  Filled 2019-08-15: qty 1

## 2019-08-15 MED ORDER — ATORVASTATIN CALCIUM 40 MG PO TABS
40.0000 mg | ORAL_TABLET | Freq: Every day | ORAL | Status: DC
  Filled 2019-08-15: qty 1

## 2019-08-15 MED ORDER — DEXAMETHASONE SODIUM PHOSPHATE 10 MG/ML IJ SOLN
INTRAMUSCULAR | Status: DC | PRN
  Administered 2019-08-15: 5 mg via INTRAVENOUS

## 2019-08-15 MED ORDER — ACETAMINOPHEN 325 MG PO TABS
325.0000 mg | ORAL_TABLET | ORAL | Status: DC | PRN
  Filled 2019-08-15: qty 2

## 2019-08-15 MED ORDER — OXYCODONE HCL 5 MG PO TABS
ORAL_TABLET | ORAL | Status: AC
  Filled 2019-08-15: qty 1

## 2019-08-15 MED ORDER — FENTANYL CITRATE (PF) 100 MCG/2ML IJ SOLN
INTRAMUSCULAR | Status: AC
  Filled 2019-08-15: qty 2

## 2019-08-15 MED ORDER — LIDOCAINE 2% (20 MG/ML) 5 ML SYRINGE
INTRAMUSCULAR | Status: AC
  Filled 2019-08-15: qty 5

## 2019-08-15 MED ORDER — OXYCODONE HCL 5 MG PO TABS
5.0000 mg | ORAL_TABLET | Freq: Once | ORAL | Status: AC | PRN
  Administered 2019-08-15: 5 mg via ORAL
  Filled 2019-08-15: qty 1

## 2019-08-15 MED ORDER — SENNA 8.6 MG PO TABS
ORAL_TABLET | ORAL | Status: AC
  Filled 2019-08-15: qty 1

## 2019-08-15 MED ORDER — ACETAMINOPHEN 160 MG/5ML PO SOLN
325.0000 mg | ORAL | Status: DC | PRN
  Filled 2019-08-15: qty 20.3

## 2019-08-15 MED ORDER — ACETAMINOPHEN 500 MG PO TABS
ORAL_TABLET | ORAL | Status: AC
  Filled 2019-08-15: qty 2

## 2019-08-15 MED ORDER — OXYCODONE HCL 5 MG/5ML PO SOLN
5.0000 mg | Freq: Once | ORAL | Status: AC | PRN
  Filled 2019-08-15: qty 5

## 2019-08-15 MED ORDER — AMLODIPINE BESYLATE 10 MG PO TABS
10.0000 mg | ORAL_TABLET | Freq: Every day | ORAL | Status: DC
  Filled 2019-08-15: qty 1

## 2019-08-15 MED ORDER — FENTANYL CITRATE (PF) 100 MCG/2ML IJ SOLN
INTRAMUSCULAR | Status: DC | PRN
  Administered 2019-08-15 (×2): 25 ug via INTRAVENOUS
  Administered 2019-08-15: 50 ug via INTRAVENOUS

## 2019-08-15 MED ORDER — OXYCODONE HCL 5 MG PO TABS
5.0000 mg | ORAL_TABLET | ORAL | Status: DC | PRN
  Administered 2019-08-15 – 2019-08-16 (×2): 5 mg via ORAL
  Filled 2019-08-15: qty 1

## 2019-08-15 MED ORDER — MIDAZOLAM HCL 2 MG/2ML IJ SOLN
INTRAMUSCULAR | Status: DC | PRN
  Administered 2019-08-15: 2 mg via INTRAVENOUS

## 2019-08-15 MED ORDER — LACTATED RINGERS IV SOLN
INTRAVENOUS | Status: DC
  Administered 2019-08-15 (×2): via INTRAVENOUS
  Filled 2019-08-15: qty 1000

## 2019-08-15 MED ORDER — ONDANSETRON HCL 4 MG/2ML IJ SOLN
INTRAMUSCULAR | Status: AC
  Filled 2019-08-15: qty 2

## 2019-08-15 MED ORDER — ACETAMINOPHEN 500 MG PO TABS
1000.0000 mg | ORAL_TABLET | Freq: Three times a day (TID) | ORAL | Status: DC
  Administered 2019-08-15 – 2019-08-16 (×2): 1000 mg via ORAL
  Filled 2019-08-15: qty 2

## 2019-08-15 MED ORDER — DOXEPIN HCL 25 MG PO CAPS
25.0000 mg | ORAL_CAPSULE | Freq: Every day | ORAL | Status: DC
  Administered 2019-08-15: 25 mg via ORAL
  Filled 2019-08-15 (×2): qty 1

## 2019-08-15 MED ORDER — SODIUM CHLORIDE 0.9 % IR SOLN
Status: DC | PRN
  Administered 2019-08-15 (×3): 3000 mL via INTRAVESICAL

## 2019-08-15 SURGICAL SUPPLY — 36 items
BAG DRAIN URO-CYSTO SKYTR STRL (DRAIN) ×3 IMPLANT
BAG URINE DRAINAGE (UROLOGICAL SUPPLIES) ×3 IMPLANT
BAG URINE LEG 500ML (DRAIN) IMPLANT
CATH FOLEY 2WAY SLVR  5CC 22FR (CATHETERS)
CATH FOLEY 2WAY SLVR 30CC 20FR (CATHETERS) IMPLANT
CATH FOLEY 2WAY SLVR 5CC 22FR (CATHETERS) IMPLANT
CATH FOLEY 3WAY 30CC 24FR (CATHETERS) ×2
CATH URTH STD 24FR FL 3W 2 (CATHETERS) ×1 IMPLANT
CLOTH BEACON ORANGE TIMEOUT ST (SAFETY) ×3 IMPLANT
ELECT BIVAP BIPO 22/24 DONUT (ELECTROSURGICAL)
ELECT REM PT RETURN 9FT ADLT (ELECTROSURGICAL)
ELECTRD BIVAP BIPO 22/24 DONUT (ELECTROSURGICAL) IMPLANT
ELECTRODE REM PT RTRN 9FT ADLT (ELECTROSURGICAL) IMPLANT
EVACUATOR MICROVAS BLADDER (UROLOGICAL SUPPLIES) IMPLANT
GLOVE BIO SURGEON STRL SZ7.5 (GLOVE) ×3 IMPLANT
GLOVE BIOGEL PI IND STRL 6.5 (GLOVE) ×1 IMPLANT
GLOVE BIOGEL PI IND STRL 7.0 (GLOVE) ×1 IMPLANT
GLOVE BIOGEL PI IND STRL 7.5 (GLOVE) ×1 IMPLANT
GLOVE BIOGEL PI INDICATOR 6.5 (GLOVE) ×2
GLOVE BIOGEL PI INDICATOR 7.0 (GLOVE) ×2
GLOVE BIOGEL PI INDICATOR 7.5 (GLOVE) ×2
GLOVE INDICATOR 6.5 STRL GRN (GLOVE) ×3 IMPLANT
GOWN STRL REUS W/ TWL LRG LVL3 (GOWN DISPOSABLE) ×3 IMPLANT
GOWN STRL REUS W/TWL LRG LVL3 (GOWN DISPOSABLE) ×6
GUIDEWIRE STR DUAL SENSOR (WIRE) ×3 IMPLANT
HOLDER FOLEY CATH W/STRAP (MISCELLANEOUS) ×3 IMPLANT
IV NS IRRIG 3000ML ARTHROMATIC (IV SOLUTION) ×9 IMPLANT
KIT TURNOVER CYSTO (KITS) ×3 IMPLANT
LOOP CUT BIPOLAR 24F LRG (ELECTROSURGICAL) ×3 IMPLANT
MANIFOLD NEPTUNE II (INSTRUMENTS) ×3 IMPLANT
PACK CYSTO (CUSTOM PROCEDURE TRAY) ×3 IMPLANT
SYR 30ML LL (SYRINGE) ×3 IMPLANT
SYRINGE IRR TOOMEY STRL 70CC (SYRINGE) ×3 IMPLANT
TUBE CONNECTING 12'X1/4 (SUCTIONS) ×1
TUBE CONNECTING 12X1/4 (SUCTIONS) ×2 IMPLANT
TUBING UROLOGY SET (TUBING) ×3 IMPLANT

## 2019-08-15 NOTE — Op Note (Signed)
NAME: Jim Mathis, Jim Mathis MEDICAL RECORD ON:[id number] ACCOUNT [id number] DATE OF BIRTH:11-Apr-1951 FACILITY: [facility] LOCATION: [facility] PHYSICIAN:Chrissi Crow Tresa Moore, MD  OPERATIVE REPORT  DATE OF PROCEDURE:  08/15/2019  PREOPERATIVE DIAGNOSIS:  Prostatic hypertrophy with refractory urinary tract symptoms.  PROCEDURE:  Transection of the prostate.  ESTIMATED BLOOD LOSS:  100 mL.  COMPLICATIONS:  None.  SPECIMENS:  Prostate chips for pathology.  FINDINGS:   1.  Bilobar prostatic hypertrophy. 2.  Wide open channel from the bladder neck to the verumontanum following transection of the prostate.  CATHETER:  A 24-French 3-way Foley catheter to normal saline irrigation, 20 mL in the balloon.  INDICATIONS:  The patient is a pleasant 68 year old man with long history of obstructing and irritative urinary symptoms.  He has been on escalating medical therapy for quite some time now on quad therapy with alpha blocker, 5-alpha reductase inhibitor,  phosphodiesterase inhibitor, anticholinergics.  His symptoms are continuing to escalate.  He was referred for management.  The patient emptied well with minimal residual urine and cystoscopy corroborated bilobar prostatic hypertrophy, not massive, but  kissing lobe in nature.  Options were discussed for further management including continued care versus outlet procedures of various types.  He wished to proceed with transurethral resection of the prostate.  Informed consent was then placed in medical  record.  DESCRIPTION OF PROCEDURE:  The patient was identified.  Procedure being transection of the prostate was confirmed.  Procedure timeout was performed.  Intravenous antibiotics administered.  General LMA anesthesia induced.  The patient was put into a low  lithotomy position, sterile field was created prepped and draped base of the penis, perineum and proximal thighs using iodine.  Cystourethroscopy was performed using a 26-French resectoscope sheath with  visual obturator.  Inspection of anterior and  posterior urethra revealed bilobar prostatic hypertrophy with kissing lobes.  Inspection of bladder revealed some mild to moderate trabeculation.  No papillary lesions or calcifications.  Ureteral orifices were singleton bilaterally.  Next, using  resectoscope loop bipolar type, under saline irrigation on very careful systematic resection was performed in a top down approach.  First, the 12 o'clock position from the bladder neck to verumontanum, then of the right lobe of the prostate and the left  lobe of the prostate down to superficial fibromuscular stroma of the prostatic capsule.  Exquisite care was taken not to undermine the area of the bladder neck or resect distal to the area of the verumontanum.  Prostate chips were irrigated with a Toomey  syringe and set aside for pathology and the entire base of the resection area was then fulgurated with coagulation current resulted in excellent hemostasis.  There was complete resolution of all visually obstructing prostatic tissue, now with a wide  open channel.  The bladder neck to the urethra and tandem ureteral orifices once again inspected and found to be uninjured.  There is no evidence of bladder perforation.  The cystoscope was then exchanged for a 24 [place] 3-way Foley catheter over a  sensor working wire to minimize trauma, 20 mL sterile in the balloon and connected to irriation with normal saline irrigation with efflux very light pink and the procedure was terminated.  The patient tolerated the procedure well.  No immediate perioperative complications.  The  patient was taken to the postanesthesia care unit in stable condition.  Plan for observation overnight and likely discharge home tomorrow.  TN/NUANCE  D:08/15/2019 T:08/15/2019 JOB:[id number]

## 2019-08-15 NOTE — H&P (Signed)
Jim Montesdward Kirlin Jr. is an 68 y.o. male.    Chief Complaint: Pre-OP TURP  HPI:   1 - Lower Urinary Tract Symptoms - years of bother from mix of obstructive and irritative symptoms, nocutria x 7. AT [place] on tamsulosin, daily tadalafil, oxybutynin, finasteride !Marland Kitchen. DRE 07/2019 50gm smooth. PVR 07/2019 "106mL" (acceptable). UA bland.   2 - Prostate Screening -  07/2019 - PSA 1.77 on finasteride / DRE 50gm smooth.   3 - Erectile Dysfunction - on dual indication daily tadalfil for poor erections with good result / meeting goals.   PMH sig for sigmoid resection / colostomy / reversal (open midline), total knee. NO ischemic CV disease / blood thinners. He is retired from [employer] (gift [employer] in [place] retired to [employer]. His PCP is Jacquiline Doealeb Parker MD with [facility].   Today " Ed " is seen to proceed with TURP for med-refracotry lower urinary tract symptoms. C19 negative. Most recent UA without infectious parameters.    Past Medical History:  Diagnosis Date  . BPH with obstruction/lower urinary tract symptoms   . Carotid stenosis, right    per pt was told he has some stenosis but not enough for surgery;   in epic under media ,  carotid ultrasound result right ICA 50-69% and incidental finding thyroid nodule  . Cervicalgia   . ED (erectile dysfunction)   . History of colon resection    per pt 12 hours after partial knee arthroplasty 2017,  s/p partial colectomy was possible from divertiulitis  . Hyperlipidemia   . Hypertension   . Migraines   . PAD (peripheral artery disease) (HCC) 08-14-2019  per pt when he walks (walks 2 miles a day) right foot gets numb but recovers quickly and when he rides his bike left hand gets numb by recovers quickly ;   denies claudication or swelling   per pt s/p  stenting to bilateral lower legs in [place] one in 2003 and the other 2007,  [place] [facility]  . Wears glasses     Past Surgical History:  Procedure Laterality Date  . COLOSTOMY REVERSAL  [place] in epic)   w/ sigmoid and descending colectomy with appendectomy  . ENDOVASCULAR STENT INSERTION  2003 and 2007  --- both done in [place]   bilateral legs  . PARTIAL COLECTOMY  09-04-2016  [place]   w/  colostomy for perferated divierticulitis  . PARTIAL KNEE ARTHROPLASTY Left 2017  . TOTAL KNEE ARTHROPLASTY Right 2012    Family History  Problem Relation Age of Onset  . Alzheimer's disease Mother   . Lung cancer Father        non smoker   Social History:  reports that he quit smoking about 25 years ago. His smoking use included cigarettes. He quit after 20.00 years of use. He has never used smokeless tobacco. He reports current alcohol use. He reports that he does not use drugs.  Allergies: No Known Allergies  No medications prior to admission.    Results for orders placed or performed during the hospital encounter of 08/13/19 (from the past 48 hour(s))  SARS CORONAVIRUS 2 (TAT 6-12 HRS) Nasal Swab Aptima Multi Swab     Status: None   Collection Time: 08/13/19 10:46 AM   Specimen: Aptima Multi Swab; Nasal Swab  Result Value Ref Range   SARS Coronavirus 2 NEGATIVE NEGATIVE    Comment: (NOTE) SARS-CoV-2 target nucleic acids are  NOT DETECTED. The SARS-CoV-2 RNA is generally detectable in upper and lower respiratory specimens during the acute phase of infection. Negative results do not preclude SARS-CoV-2 infection, do not rule out co-infections with other pathogens, and should not be used as the sole basis for treatment or other patient management decisions. Negative results must be combined with clinical observations, patient history, and epidemiological information. The expected result is Negative. Fact Sheet for Patients: SugarRoll.be Fact Sheet for Healthcare Providers: https://www.woods-mathews.com/ This test is not yet approved or cleared by the [place] FDA and  has been authorized for  detection and/or diagnosis of SARS-CoV-2 by FDA under an Emergency Use Authorization (EUA). This EUA will remain  in effect (meaning this test can be used) for the duration of the COVID-19 declaration under Section 56 4(b)(1) of the Act, 21 U.S.C. section 360bbb-3(b)(1), unless the authorization is terminated or revoked sooner. Performed at [facility], [place] [address] [place], [place] [address]    No results found.  Review of Systems  Constitutional: Negative.  Negative for chills and fever.  Genitourinary: Positive for frequency and urgency.  All other systems reviewed and are negative.   Height 5\' 10"  (1.778 m), weight 88.9 kg. Physical Exam  Constitutional: He appears well-developed.  HENT:  Head: Normocephalic.  Eyes: Pupils are equal, round, and reactive to light.  Neck: Normal range of motion.  Cardiovascular: Normal rate.  Respiratory: Effort normal.  GI: Soft.  Genitourinary:    Genitourinary Comments: No CVAT   Musculoskeletal: Normal range of motion.  Neurological: He is alert.  Skin: Skin is warm.  Psychiatric: He has a normal mood and affect.     Assessment/Plan  Proceed as planned with TURP. Risks, benefits, alternatives, expected peri-op course discussed previously and reiterated today.   Alexis Frock, MD 08/15/2019, 8:58 AM

## 2019-08-15 NOTE — Transfer of Care (Signed)
Immediate Anesthesia Transfer of Care Note  Patient: Jim Mathis.  Procedure(s) Performed: Procedure(s) (LRB): TRANSURETHRAL RESECTION OF THE PROSTATE (TURP) (N/A)  Patient Location: PACU  Anesthesia Type: General  Level of Consciousness: awake, oriented, sedated and patient cooperative  Airway & Oxygen Therapy: Patient Spontanous Breathing and Patient connected to face mask oxygen  Post-op Assessment: Report given to PACU RN and Post -op Vital signs reviewed and stable  Post vital signs: Reviewed and stable  Complications: No apparent anesthesia complications  Last Vitals:  Vitals Value Taken Time  BP 114/70 08/15/19 1401  Temp    Pulse 74 08/15/19 1407  Resp 12 08/15/19 1407  SpO2 98 % 08/15/19 1407  Vitals shown include unvalidated device data.  Last Pain:  Vitals:   08/15/19 1059  TempSrc: Oral  PainSc: 0-No pain      Patients Stated Pain Goal: 6 (08/15/19 1059)

## 2019-08-15 NOTE — Anesthesia Procedure Notes (Signed)
Procedure Name: LMA Insertion Date/Time: 08/15/2019 1:12 PM Performed by: Suan Halter, CRNA Pre-anesthesia Checklist: Patient identified, Emergency Drugs available, Suction available and Patient being monitored Patient Re-evaluated:Patient Re-evaluated prior to induction Oxygen Delivery Method: Circle system utilized Preoxygenation: Pre-oxygenation with 100% oxygen Induction Type: IV induction Ventilation: Mask ventilation without difficulty LMA: LMA inserted LMA Size: 4.0 Number of attempts: 1 Airway Equipment and Method: Bite block Placement Confirmation: positive ETCO2 Tube secured with: Tape Dental Injury: Teeth and Oropharynx as per pre-operative assessment

## 2019-08-15 NOTE — Brief Op Note (Signed)
08/15/2019  1:51 PM  PATIENT:  Jim Mathis.  68 y.o. male  PRE-OPERATIVE DIAGNOSIS:  REFRACTORY PROSTATIC HYPERTROPHY  POST-OPERATIVE DIAGNOSIS:  REFRACTORY PROSTATIC HYPERTROPHY  PROCEDURE:  Procedure(s) with comments: TRANSURETHRAL RESECTION OF THE PROSTATE (TURP) (N/A) - 75 MINS  SURGEON:  Surgeon(s) and Role:    * Alexis Frock, MD - Primary  PHYSICIAN ASSISTANT:   ASSISTANTS: none   ANESTHESIA:   general  EBL:  50 mL   BLOOD ADMINISTERED:none  DRAINS: 2F 3 way foley to NS irrigation   LOCAL MEDICATIONS USED:  NONE  SPECIMEN:  Source of Specimen:  prostate chips  DISPOSITION OF SPECIMEN:  PATHOLOGY  COUNTS:  YES  TOURNIQUET:  * No tourniquets in log *  DICTATION: .Other Dictation: Dictation Number [id number]  PLAN OF CARE: Admit for overnight observation  PATIENT DISPOSITION:  PACU - hemodynamically stable.   Delay start of Pharmacological VTE agent (>24hrs) due to surgical blood loss or risk of bleeding: yes

## 2019-08-16 ENCOUNTER — Encounter (HOSPITAL_BASED_OUTPATIENT_CLINIC_OR_DEPARTMENT_OTHER): Payer: Self-pay | Admitting: Urology

## 2019-08-16 DIAGNOSIS — N401 Enlarged prostate with lower urinary tract symptoms: Secondary | ICD-10-CM | POA: Diagnosis not present

## 2019-08-16 LAB — HEMOGLOBIN AND HEMATOCRIT, BLOOD
HCT: 39.3 % (ref 39.0–52.0)
Hemoglobin: 12.6 g/dL — ABNORMAL LOW (ref 13.0–17.0)

## 2019-08-16 MED ORDER — SENNOSIDES-DOCUSATE SODIUM 8.6-50 MG PO TABS: 1.0000 | ORAL_TABLET | Freq: Two times a day (BID) | ORAL | 0 refills | Status: DC

## 2019-08-16 MED ORDER — CEPHALEXIN 500 MG PO CAPS: 500.0000 mg | ORAL_CAPSULE | Freq: Two times a day (BID) | ORAL | 0 refills | Status: DC

## 2019-08-16 MED ORDER — OXYCODONE-ACETAMINOPHEN 5-325 MG PO TABS: 1.0000 | ORAL_TABLET | Freq: Three times a day (TID) | ORAL | 0 refills | Status: DC | PRN

## 2019-08-16 MED ORDER — ACETAMINOPHEN 500 MG PO TABS
ORAL_TABLET | ORAL | Status: AC
  Filled 2019-08-16: qty 2

## 2019-08-16 MED ORDER — OXYCODONE HCL 5 MG PO TABS
ORAL_TABLET | ORAL | Status: AC
  Filled 2019-08-16: qty 1

## 2019-08-16 NOTE — Discharge Summary (Signed)
Physician Discharge Summary  Patient ID: Jim Mathis. MRN: [id number] DOB/AGE: 69/01/1951 69 y.o.  Admit date: 08/15/2019 Discharge date: 08/16/2019  Admission Diagnoses: Prostatic Hypertrophy with Refractory Lower Urinary Tract Symptoms  Discharge Diagnoses:  Active Problems:   Prostatic hyperplasia   Discharged Condition: good  Hospital Course: Pt underwent transurethral resection of prostate on 08/15/19, the day of admission, without acute complication. He was observed overnight on gently bladder irrigation that was slowly weaned to off. By the AM of POD 1, he is ambulatory, pain controlled on PO meds, maintaining PO nutrition, and felt to be adequate for discharge. Hgb 12.6, prostate chip pathology pending at discharge.   Consults: None  Significant Diagnostic Studies: labs: as per above  Treatments: surgery: as per above  Discharge Exam: Blood pressure (!) 108/59, pulse 81, temperature 98.2 F (36.8 C), resp. rate 20, height 5\' 10"  (1.778 m), weight 86.8 kg, SpO2 95 %. General appearance: alert, cooperative, appears stated age and at baseline Eyes: negative, wearing glasses Nose: no discharge Throat: lips, mucosa, and tongue normal; teeth and gums normal Neck: supple, symmetrical, trachea midline Back: symmetric, no curvature. ROM normal. No CVA tenderness. Resp: non-labored on room air.  Cardio: Nl rate GI: soft, non-tender; bowel sounds normal; no masses,  no organomegaly Male genitalia: 3 way foley in place wtih very light pink urine off irrigation.  Extremities: extremities normal, atraumatic, no cyanosis or edema Pulses: 2+ and symmetric Skin: Skin color, texture, turgor normal. No rashes or lesions Neurologic: Grossly normal  Disposition: Discharge disposition: 01-Home or Self Care        Allergies as of 08/16/2019   No Known Allergies     Medication List    STOP taking these medications   amoxicillin 500 MG capsule Commonly known as: AMOXIL    aspirin EC 81 MG tablet   tamsulosin 0.4 MG Caps capsule Commonly known as: FLOMAX     TAKE these medications   ALLERGY EYE OP Apply to eye as needed.   amLODipine 10 MG tablet Commonly known as: NORVASC Take 1 tablet (10 mg total) by mouth daily.   atorvastatin 40 MG tablet Commonly known as: LIPITOR Take 1 tablet (40 mg total) by mouth daily.   cephALEXin 500 MG capsule Commonly known as: Keflex Take 1 capsule (500 mg total) by mouth 2 (two) times daily. X 2 days to prevent infection with catheter in place.   doxepin 25 MG capsule Commonly known as: SINEQUAN Take 1 capsule (25 mg total) by mouth daily. What changed: when to take this   finasteride 5 MG tablet Commonly known as: PROSCAR Take 1 tablet (5 mg total) by mouth daily. What changed: when to take this   oxyCODONE-acetaminophen 5-325 MG tablet Commonly known as: Percocet Take 1-2 tablets by mouth every 8 (eight) hours as needed for moderate pain or severe pain. Post-operatively   senna-docusate 8.6-50 MG tablet Commonly known as: Senokot-S Take 1 tablet by mouth 2 (two) times daily. While taking strong pain meds to prevent constipation   tadalafil 20 MG tablet Commonly known as: CIALIS Take 0.5 tablets (10 mg total) by mouth daily.   triamcinolone cream 0.5 % Commonly known as: KENALOG Apply 1 application topically 3 (three) times daily. Rash on chest      Follow-up Information    Hillery AldoGibson, Larry Ryan, NP.   Specialty: Nurse Practitioner Why: for post-op check and catheter removal.  Contact information: [address] [place] [address] [phone]  Signed: Alexis Frock 08/16/2019, 7:32 AM

## 2019-08-16 NOTE — Anesthesia Postprocedure Evaluation (Signed)
Anesthesia Post Note  Patient: Jim Mathis.  Procedure(s) Performed: TRANSURETHRAL RESECTION OF THE PROSTATE (TURP) (N/A Prostate)     Patient location during evaluation: PACU Anesthesia Type: General Level of consciousness: awake and alert Pain management: pain level controlled Vital Signs Assessment: post-procedure vital signs reviewed and stable Respiratory status: spontaneous breathing, nonlabored ventilation, respiratory function stable and patient connected to nasal cannula oxygen Cardiovascular status: blood pressure returned to baseline and stable Postop Assessment: no apparent nausea or vomiting Anesthetic complications: no    Last Vitals:  Vitals:   08/16/19 0330 08/16/19 0735  BP: (!) 108/59 111/70  Pulse: 81 72  Resp:  16  Temp: 36.8 C 36.4 C  SpO2: 95% 98%    Last Pain:  Vitals:   08/16/19 0735  TempSrc: Oral  PainSc:                  Kellis Topete

## 2019-08-16 NOTE — Discharge Instructions (Signed)
Transurethral Resection of the Prostate, Care After °This sheet gives you information about how to care for yourself after your procedure. Your health care provider may also give you more specific instructions. If you have problems or questions, contact your health care provider. °What can I expect after the procedure? °After the procedure, it is common to have: °· Mild pain in your lower abdomen. °· Soreness or mild discomfort in your penis from having the catheter inserted during the procedure. °· A feeling of urgency when you need to urinate. °· A small amount of blood in your urine. You may notice some small blood clots in your urine. These are normal. °Follow these instructions at home: °Medicines °· Take over-the-counter and prescription medicines only as told by your health care provider. °· If you were prescribed an antibiotic medicine, take it as told by your health care provider. Do not stop taking the antibiotic even if you start to feel better. °· Ask your health care provider if the medicine prescribed to you: °? Requires you to avoid driving or using heavy machinery. °? Can cause constipation. You may need to take actions to prevent or treat constipation, such as: °§ Take over-the-counter or prescription medicines. °§ Eat foods that are high in fiber, such as fresh fruits and vegetables, whole grains, and beans. °§ Limit foods that are high in fat and processed sugars, such as fried or sweet foods. °· Do not drive for 24 hours if you were given a sedative during your procedure. °Activity ° °· Return to your normal activities as told by your health care provider. Ask your health care provider what activities are safe for you. °· Do not lift anything that is heavier than 10 lb (4.5 kg), or the limit that you are told, for 3 weeks after the procedure or until your health care provider says that it is safe. °· Avoid intense physical activity for as long as told by your health care provider. °· Avoid  sitting for a long time without moving. Get up and move around one or more times every few hours. This helps to prevent blood clots. You may increase your physical activity gradually as you start to feel better. °Lifestyle °· Do not drink alcohol for as long as told by your health care provider. This is especially important if you are taking prescription pain medicines. °· Do not engage in sexual activity until your health care provider says that you can do this. °General instructions ° °· Do not take baths, swim, or use a hot tub until your health care provider approves. °· Drink enough fluid to keep your urine pale yellow. °· Urinate as soon as you feel the need to. Do not try to hold your urine for long periods of time. °· If your health care provider approves, you may take a stool softener for 2-3 weeks to prevent you from straining to have a bowel movement. °· Wear compression stockings as told by your health care provider. These stockings help to prevent blood clots and reduce swelling in your legs. °· Keep all follow-up visits as told by your health care provider. This is important. °Contact a health care provider if you have: °· Difficulty urinating. °· A fever. °· Pain that gets worse or does not improve with medicine. °· Blood in your urine that does not go away after 1 week of resting and drinking more fluids. °· Swelling in your penis or testicles. °Get help right away if: °· You are unable   to urinate.  You are having more blood clots in your urine instead of fewer.  You have: ? Large blood clots. ? A lot of blood in your urine. ? Pain in your back or lower abdomen. ? Pain or swelling in your legs. ? Chills and you are shaking. ? Difficulty breathing or shortness of breath. Summary  After the procedure, it is common to have a small amount of blood in your urine.  Avoid heavy lifting and intense physical activity for as long as told by your health care provider.  Urinate as soon as you  feel the need to. Do not try to hold your urine for long periods of time.  Keep all follow-up visits as told by your health care provider. This is important. This information is not intended to replace advice given to you by your health care provider. Make sure you discuss any questions you have with your health care provider. Document Released: 12/06/2005 Document Revised: 03/28/2019 Document Reviewed: 09/06/2018 Elsevier Patient Education  2020 [place] may have urinary urgency (bladder spasms) and bloody urine on / off and passage of small tissue fragments for up to 2 weeks. This is normal.  2 - Call MD or go to ER for fever >102, severe pain / nausea / vomiting not relieved by medications, or acute change in medical status

## 2019-08-22 ENCOUNTER — Other Ambulatory Visit: Payer: Self-pay

## 2019-08-22 ENCOUNTER — Ambulatory Visit (INDEPENDENT_AMBULATORY_CARE_PROVIDER_SITE_OTHER): Payer: PPO | Admitting: Family Medicine

## 2019-08-22 DIAGNOSIS — R51 Headache: Secondary | ICD-10-CM | POA: Diagnosis not present

## 2019-08-22 DIAGNOSIS — R519 Headache, unspecified: Secondary | ICD-10-CM

## 2019-08-22 DIAGNOSIS — Z1159 Encounter for screening for other viral diseases: Secondary | ICD-10-CM

## 2019-08-22 DIAGNOSIS — N400 Enlarged prostate without lower urinary tract symptoms: Secondary | ICD-10-CM | POA: Diagnosis not present

## 2019-08-22 NOTE — Assessment & Plan Note (Signed)
Recovering status post TURP continue Cialis 10 mg daily and Proscar 5 mg daily.

## 2019-08-22 NOTE — Assessment & Plan Note (Signed)
Stable.  Continue management per neurology. 

## 2019-08-22 NOTE — Progress Notes (Signed)
   Chief Complaint:  Jim Mathis. is a 68 y.o. male who presents today for a virtual office visit with a chief complaint of hepatitis C Screening.   Assessment/Plan:  Need for hepatitis C screen Discussed procedure for screening for hepatitis C.  He is interested in this.  We will check this when he comes in for his physical in about 6 months.  Nonintractable headache Stable.  Continue management per neurology.  BPH with ED and OAB Recovering status post TURP continue Cialis 10 mg daily and Proscar 5 mg daily.     Subjective:  HPI:  Patient is interested in getting screened for hepatitis C.  Does not think his has never had this done in the past.  No abdominal pain.  No reported melena or hematochezia.  His  chronic medical conditions are outlined below:  # Migraines / Numbness -Patient has been evaluated by neurology since her last visit.  Was found to have ulnar neuropathy and carpal tunnel syndrome.  Symptoms are relatively stable.  #BPH with ED and OAB -On Cialis 5 mg daily and finasteride 5 mg daily. - Had TURP done about a week ago.  Seems to be recovering well.  Still has a small amount of hematuria.  ROS: Per HPI  PMH: He reports that he quit smoking about 25 years ago. His smoking use included cigarettes. He quit after 20.00 years of use. He has never used smokeless tobacco. He reports current alcohol use. He reports that he does not use drugs.      Objective/Observations  Physical Exam: Gen: NAD, resting comfortably Pulm: Normal work of breathing Neuro: Grossly normal, moves all extremities Psych: Normal affect and thought content  No results found for this or any previous visit (from the past 24 hour(s)).   Virtual Visit via Video   I connected with Elisha Ponder. on 08/22/19 at 11:00 AM EDT by a video enabled telemedicine application and verified that I am speaking with the correct person using two identifiers. I discussed the limitations of  evaluation and management by telemedicine and the availability of in person appointments. The patient expressed understanding and agreed to proceed.   Patient location: Home Provider location: [place] participating in the virtual visit: Myself and Patient     Algis Greenhouse. Jerline Pain, MD 08/22/2019 11:08 AM

## 2019-09-11 DIAGNOSIS — R3915 Urgency of urination: Secondary | ICD-10-CM | POA: Diagnosis not present

## 2019-09-11 DIAGNOSIS — N401 Enlarged prostate with lower urinary tract symptoms: Secondary | ICD-10-CM | POA: Diagnosis not present

## 2019-09-27 ENCOUNTER — Encounter: Payer: Self-pay | Admitting: Family Medicine

## 2019-09-28 ENCOUNTER — Other Ambulatory Visit: Payer: Self-pay

## 2019-09-28 MED ORDER — DOXEPIN HCL 25 MG PO CAPS: 25.0000 mg | ORAL_CAPSULE | Freq: Every day | ORAL | 1 refills | Status: DC

## 2019-10-12 DIAGNOSIS — R3915 Urgency of urination: Secondary | ICD-10-CM | POA: Diagnosis not present

## 2019-10-12 DIAGNOSIS — N401 Enlarged prostate with lower urinary tract symptoms: Secondary | ICD-10-CM | POA: Diagnosis not present

## 2019-10-12 DIAGNOSIS — R3912 Poor urinary stream: Secondary | ICD-10-CM | POA: Diagnosis not present

## 2019-10-17 DIAGNOSIS — M6289 Other specified disorders of muscle: Secondary | ICD-10-CM | POA: Diagnosis not present

## 2019-10-17 DIAGNOSIS — M6281 Muscle weakness (generalized): Secondary | ICD-10-CM | POA: Diagnosis not present

## 2019-10-17 DIAGNOSIS — M62838 Other muscle spasm: Secondary | ICD-10-CM | POA: Diagnosis not present

## 2019-10-17 DIAGNOSIS — R3912 Poor urinary stream: Secondary | ICD-10-CM | POA: Diagnosis not present

## 2019-10-17 DIAGNOSIS — R3915 Urgency of urination: Secondary | ICD-10-CM | POA: Diagnosis not present

## 2019-10-24 DIAGNOSIS — R3912 Poor urinary stream: Secondary | ICD-10-CM | POA: Diagnosis not present

## 2019-10-24 DIAGNOSIS — M6289 Other specified disorders of muscle: Secondary | ICD-10-CM | POA: Diagnosis not present

## 2019-10-24 DIAGNOSIS — M6281 Muscle weakness (generalized): Secondary | ICD-10-CM | POA: Diagnosis not present

## 2019-10-24 DIAGNOSIS — R3915 Urgency of urination: Secondary | ICD-10-CM | POA: Diagnosis not present

## 2019-10-24 DIAGNOSIS — M62838 Other muscle spasm: Secondary | ICD-10-CM | POA: Diagnosis not present

## 2019-10-26 ENCOUNTER — Encounter: Payer: Self-pay | Admitting: Family Medicine

## 2019-10-26 ENCOUNTER — Ambulatory Visit (INDEPENDENT_AMBULATORY_CARE_PROVIDER_SITE_OTHER): Payer: PPO | Admitting: Family Medicine

## 2019-10-26 DIAGNOSIS — R197 Diarrhea, unspecified: Secondary | ICD-10-CM | POA: Diagnosis not present

## 2019-10-26 NOTE — Progress Notes (Signed)
    Chief Complaint:  Jim Eley. is a 68 y.o. male who presents today for a virtual office visit with a chief complaint of diarrhea.   Assessment/Plan:  Diarrhea No red flags.  Patient has a history of a colectomy secondary to perforated diverticulitis.  It is possible he is having some food intolerance causing his symptoms.  Advised him to take a food diary for the next couple weeks and see if there are any specific triggering factors.  Also recommended probiotics and diet high in fiber.  Recommended that he use Imodium if needed.  If continues to have issues within the next couple of weeks and does not have any obvious dietary triggers, will pursue further work-up including blood work and stool samples.  Discussed reason to return to care.  Follow-up as needed.    Subjective:  HPI:  Diarrhea Started about 4 weeks ago.  Has been intermittent in nature. Happens several times per week where he will have a day or more with several episodes of diarrhea.  He initially had very watery stools during episodes however over the last week or 2 has had more flaky and soft stools.  In between the days of diarrhea he will have several days of normal stool.  Occasionally associated with nausea.  No vomiting.  No constipation.  No weight loss.  No night sweats.  No melena or hematochezia.  No abdominal pain.  No specific treatments tried.  No dietary changes or triggers.  No other obvious aggravating or alleviating factors.  ROS: Per HPI  PMH: He reports that he quit smoking about 25 years ago. His smoking use included cigarettes. He quit after 20.00 years of use. He has never used smokeless tobacco. He reports current alcohol use. He reports that he does not use drugs.      Objective/Observations  Physical Exam: Gen: NAD, resting comfortably Pulm: Normal work of breathing Neuro: Grossly normal, moves all extremities Psych: Normal affect and thought content  No results found for this or any  previous visit (from the past 24 hour(s)).   Virtual Visit via Video   I connected with Jim Mathis. on 10/26/19 at  3:20 PM EST by a video enabled telemedicine application and verified that I am speaking with the correct person using two identifiers. I discussed the limitations of evaluation and management by telemedicine and the availability of in person appointments. The patient expressed understanding and agreed to proceed.   Patient location: Home Provider location: [place] participating in the virtual visit: Myself and Patient     Algis Greenhouse. Jerline Pain, MD 10/26/2019 3:16 PM

## 2019-10-29 ENCOUNTER — Ambulatory Visit: Payer: PPO | Admitting: Family Medicine

## 2019-10-31 DIAGNOSIS — R3915 Urgency of urination: Secondary | ICD-10-CM | POA: Diagnosis not present

## 2019-10-31 DIAGNOSIS — M6289 Other specified disorders of muscle: Secondary | ICD-10-CM | POA: Diagnosis not present

## 2019-10-31 DIAGNOSIS — M62838 Other muscle spasm: Secondary | ICD-10-CM | POA: Diagnosis not present

## 2019-10-31 DIAGNOSIS — R3912 Poor urinary stream: Secondary | ICD-10-CM | POA: Diagnosis not present

## 2019-10-31 DIAGNOSIS — M6281 Muscle weakness (generalized): Secondary | ICD-10-CM | POA: Diagnosis not present

## 2019-11-07 DIAGNOSIS — M6281 Muscle weakness (generalized): Secondary | ICD-10-CM | POA: Diagnosis not present

## 2019-11-07 DIAGNOSIS — M6289 Other specified disorders of muscle: Secondary | ICD-10-CM | POA: Diagnosis not present

## 2019-11-07 DIAGNOSIS — R3912 Poor urinary stream: Secondary | ICD-10-CM | POA: Diagnosis not present

## 2019-11-07 DIAGNOSIS — R3915 Urgency of urination: Secondary | ICD-10-CM | POA: Diagnosis not present

## 2019-11-07 DIAGNOSIS — M62838 Other muscle spasm: Secondary | ICD-10-CM | POA: Diagnosis not present

## 2019-11-20 DIAGNOSIS — R3912 Poor urinary stream: Secondary | ICD-10-CM | POA: Diagnosis not present

## 2019-11-20 DIAGNOSIS — N5201 Erectile dysfunction due to arterial insufficiency: Secondary | ICD-10-CM | POA: Diagnosis not present

## 2019-11-20 DIAGNOSIS — R3915 Urgency of urination: Secondary | ICD-10-CM | POA: Diagnosis not present

## 2019-11-23 ENCOUNTER — Other Ambulatory Visit: Payer: Self-pay | Admitting: Urology

## 2019-12-11 ENCOUNTER — Encounter (HOSPITAL_BASED_OUTPATIENT_CLINIC_OR_DEPARTMENT_OTHER): Payer: Self-pay | Admitting: Urology

## 2019-12-11 ENCOUNTER — Other Ambulatory Visit: Payer: Self-pay

## 2019-12-11 NOTE — Progress Notes (Signed)
Spoke with patient via telephone for pre op interview. NPO after MN. Patient to take Lipitor and Norvasc AM of surgery with a sip of water. Will need ISTAT 8 AM of surgery Norva Karvonen). Arrival time 0945.

## 2019-12-15 ENCOUNTER — Other Ambulatory Visit (HOSPITAL_COMMUNITY)
Admission: RE | Admit: 2019-12-15 | Discharge: 2019-12-15 | Disposition: A | Discharge status: Home / Self Care | Payer: PPO | Source: Ambulatory Visit | Attending: Urology | Admitting: Urology

## 2019-12-15 DIAGNOSIS — Z20828 Contact with and (suspected) exposure to other viral communicable diseases: Secondary | ICD-10-CM | POA: Insufficient documentation

## 2019-12-15 DIAGNOSIS — Z01812 Encounter for preprocedural laboratory examination: Secondary | ICD-10-CM | POA: Diagnosis not present

## 2019-12-16 LAB — NOVEL CORONAVIRUS, NAA (HOSP ORDER, SEND-OUT TO REF LAB; TAT 18-24 HRS): SARS-CoV-2, NAA: NOT DETECTED

## 2019-12-19 ENCOUNTER — Ambulatory Visit (HOSPITAL_BASED_OUTPATIENT_CLINIC_OR_DEPARTMENT_OTHER): Payer: PPO | Admitting: Certified Registered Nurse Anesthetist

## 2019-12-19 ENCOUNTER — Other Ambulatory Visit: Payer: Self-pay

## 2019-12-19 ENCOUNTER — Encounter (HOSPITAL_BASED_OUTPATIENT_CLINIC_OR_DEPARTMENT_OTHER): Payer: Self-pay | Admitting: Urology

## 2019-12-19 ENCOUNTER — Ambulatory Visit (HOSPITAL_BASED_OUTPATIENT_CLINIC_OR_DEPARTMENT_OTHER)
Admission: RE | Admit: 2019-12-19 | Discharge: 2019-12-19 | Disposition: A | Discharge status: Home / Self Care | Payer: PPO | Attending: Urology | Admitting: Urology

## 2019-12-19 ENCOUNTER — Encounter (HOSPITAL_BASED_OUTPATIENT_CLINIC_OR_DEPARTMENT_OTHER)
Admission: RE | Disposition: A | Discharge status: Home / Self Care | Payer: Self-pay | Source: Home / Self Care | Attending: Urology

## 2019-12-19 DIAGNOSIS — N35919 Unspecified urethral stricture, male, unspecified site: Secondary | ICD-10-CM | POA: Diagnosis not present

## 2019-12-19 DIAGNOSIS — E785 Hyperlipidemia, unspecified: Secondary | ICD-10-CM | POA: Diagnosis not present

## 2019-12-19 DIAGNOSIS — Z87891 Personal history of nicotine dependence: Secondary | ICD-10-CM | POA: Insufficient documentation

## 2019-12-19 DIAGNOSIS — N35811 Other urethral stricture, male, meatal: Secondary | ICD-10-CM | POA: Diagnosis not present

## 2019-12-19 DIAGNOSIS — F419 Anxiety disorder, unspecified: Secondary | ICD-10-CM | POA: Diagnosis not present

## 2019-12-19 DIAGNOSIS — I10 Essential (primary) hypertension: Secondary | ICD-10-CM | POA: Insufficient documentation

## 2019-12-19 DIAGNOSIS — N99110 Postprocedural urethral stricture, male, meatal: Secondary | ICD-10-CM | POA: Diagnosis not present

## 2019-12-19 HISTORY — PX: CYSTOSCOPY WITH INJECTION: SHX1424

## 2019-12-19 HISTORY — PX: CYSTOSCOPY WITH URETHRAL DILATATION: SHX5125

## 2019-12-19 LAB — POCT I-STAT, CHEM 8
BUN: 15 mg/dL (ref 8–23)
Calcium, Ion: 1.27 mmol/L (ref 1.15–1.40)
Chloride: 106 mmol/L (ref 98–111)
Creatinine, Ser: 0.9 mg/dL (ref 0.61–1.24)
Glucose, Bld: 92 mg/dL (ref 70–99)
HCT: 46 % (ref 39.0–52.0)
Hemoglobin: 15.6 g/dL (ref 13.0–17.0)
Potassium: 4.3 mmol/L (ref 3.5–5.1)
Sodium: 140 mmol/L (ref 135–145)
TCO2: 24 mmol/L (ref 22–32)

## 2019-12-19 SURGERY — CYSTOSCOPY, WITH URETHRAL DILATION
Anesthesia: General

## 2019-12-19 MED ORDER — PROMETHAZINE HCL 25 MG/ML IJ SOLN
6.2500 mg | INTRAMUSCULAR | Status: DC | PRN
  Filled 2019-12-19: qty 1

## 2019-12-19 MED ORDER — FENTANYL CITRATE (PF) 100 MCG/2ML IJ SOLN
INTRAMUSCULAR | Status: DC | PRN
  Administered 2019-12-19: 50 ug via INTRAVENOUS

## 2019-12-19 MED ORDER — CEPHALEXIN 500 MG PO CAPS: 500.0000 mg | ORAL_CAPSULE | Freq: Two times a day (BID) | ORAL | 0 refills | Status: DC

## 2019-12-19 MED ORDER — OXYCODONE HCL 5 MG PO TABS
5.0000 mg | ORAL_TABLET | Freq: Once | ORAL | Status: DC | PRN
  Filled 2019-12-19: qty 1

## 2019-12-19 MED ORDER — ONDANSETRON HCL 4 MG/2ML IJ SOLN
INTRAMUSCULAR | Status: AC
  Filled 2019-12-19: qty 2

## 2019-12-19 MED ORDER — EPHEDRINE 5 MG/ML INJ
INTRAVENOUS | Status: AC
  Filled 2019-12-19: qty 10

## 2019-12-19 MED ORDER — LACTATED RINGERS IV SOLN
INTRAVENOUS | Status: DC
  Filled 2019-12-19: qty 1000

## 2019-12-19 MED ORDER — MIDAZOLAM HCL 5 MG/5ML IJ SOLN
INTRAMUSCULAR | Status: DC | PRN
  Administered 2019-12-19: 2 mg via INTRAVENOUS

## 2019-12-19 MED ORDER — LIDOCAINE 2% (20 MG/ML) 5 ML SYRINGE
INTRAMUSCULAR | Status: AC
  Filled 2019-12-19: qty 5

## 2019-12-19 MED ORDER — KETOROLAC TROMETHAMINE 30 MG/ML IJ SOLN
INTRAMUSCULAR | Status: DC | PRN
  Administered 2019-12-19: 30 mg via INTRAVENOUS

## 2019-12-19 MED ORDER — DEXAMETHASONE SODIUM PHOSPHATE 10 MG/ML IJ SOLN
INTRAMUSCULAR | Status: AC
  Filled 2019-12-19: qty 1

## 2019-12-19 MED ORDER — MIDAZOLAM HCL 2 MG/2ML IJ SOLN
INTRAMUSCULAR | Status: AC
  Filled 2019-12-19: qty 2

## 2019-12-19 MED ORDER — OXYCODONE HCL 5 MG/5ML PO SOLN
5.0000 mg | Freq: Once | ORAL | Status: DC | PRN
  Filled 2019-12-19: qty 5

## 2019-12-19 MED ORDER — GENTAMICIN SULFATE 40 MG/ML IJ SOLN
5.0000 mg/kg | INTRAVENOUS | Status: AC
  Administered 2019-12-19: 430 mg via INTRAVENOUS
  Filled 2019-12-19 (×2): qty 10.75

## 2019-12-19 MED ORDER — PROPOFOL 10 MG/ML IV BOLUS
INTRAVENOUS | Status: DC | PRN
  Administered 2019-12-19: 180 ug via INTRAVENOUS

## 2019-12-19 MED ORDER — ONDANSETRON HCL 4 MG/2ML IJ SOLN
INTRAMUSCULAR | Status: DC | PRN
  Administered 2019-12-19: 4 mg via INTRAVENOUS

## 2019-12-19 MED ORDER — DEXAMETHASONE SODIUM PHOSPHATE 10 MG/ML IJ SOLN
INTRAMUSCULAR | Status: DC | PRN
  Administered 2019-12-19: 10 mg via INTRAVENOUS

## 2019-12-19 MED ORDER — PROPOFOL 10 MG/ML IV BOLUS
INTRAVENOUS | Status: AC
  Filled 2019-12-19: qty 20

## 2019-12-19 MED ORDER — EPHEDRINE SULFATE-NACL 50-0.9 MG/10ML-% IV SOSY
PREFILLED_SYRINGE | INTRAVENOUS | Status: DC | PRN
  Administered 2019-12-19: 15 mg via INTRAVENOUS

## 2019-12-19 MED ORDER — KETOROLAC TROMETHAMINE 30 MG/ML IJ SOLN
INTRAMUSCULAR | Status: AC
  Filled 2019-12-19: qty 1

## 2019-12-19 MED ORDER — FENTANYL CITRATE (PF) 100 MCG/2ML IJ SOLN
INTRAMUSCULAR | Status: AC
  Filled 2019-12-19: qty 2

## 2019-12-19 MED ORDER — HYDROMORPHONE HCL 1 MG/ML IJ SOLN
0.2500 mg | INTRAMUSCULAR | Status: DC | PRN
  Filled 2019-12-19: qty 0.5

## 2019-12-19 MED ORDER — LIDOCAINE 2% (20 MG/ML) 5 ML SYRINGE
INTRAMUSCULAR | Status: DC | PRN
  Administered 2019-12-19: 80 mg via INTRAVENOUS

## 2019-12-19 MED ORDER — TRAMADOL HCL 50 MG PO TABS: 50.0000 mg | ORAL_TABLET | Freq: Four times a day (QID) | ORAL | 0 refills | Status: DC | PRN

## 2019-12-19 MED ORDER — SODIUM CHLORIDE 0.9 % IV SOLN
1.0000 mg | Freq: Once | INTRAVENOUS | Status: DC
  Filled 2019-12-19: qty 2

## 2019-12-19 SURGICAL SUPPLY — 31 items
BAG DRAIN URO-CYSTO SKYTR STRL (DRAIN) ×3 IMPLANT
BAG URINE DRAIN 2000ML AR STRL (UROLOGICAL SUPPLIES) ×3 IMPLANT
BAG URINE LEG 500ML (DRAIN) ×3 IMPLANT
BALLN NEPHROSTOMY (BALLOONS) ×3
BALLOON NEPHROSTOMY (BALLOONS) ×1 IMPLANT
CATH FOLEY 2W COUNCIL 20FR 5CC (CATHETERS) ×3 IMPLANT
CATH FOLEY 2W COUNCIL 5CC 16FR (CATHETERS) IMPLANT
CATH FOLEY 2W COUNCIL 5CC 18FR (CATHETERS) IMPLANT
CATH FOLEY 2WAY  3CC 10FR (CATHETERS)
CATH FOLEY 2WAY 3CC 10FR (CATHETERS) IMPLANT
CATH ROBINSON RED A/P 14FR (CATHETERS) IMPLANT
CLOTH BEACON ORANGE TIMEOUT ST (SAFETY) ×6 IMPLANT
ELECT REM PT RETURN 9FT ADLT (ELECTROSURGICAL) ×3
ELECTRODE REM PT RTRN 9FT ADLT (ELECTROSURGICAL) ×1 IMPLANT
GLOVE BIO SURGEON STRL SZ7.5 (GLOVE) ×3 IMPLANT
GOWN STRL REUS W/ TWL LRG LVL3 (GOWN DISPOSABLE) ×3 IMPLANT
GOWN STRL REUS W/TWL LRG LVL3 (GOWN DISPOSABLE) ×6
GOWN STRL REUS W/TWL XL LVL3 (GOWN DISPOSABLE) ×9 IMPLANT
GUIDEWIRE ANG ZIPWIRE 038X150 (WIRE) ×3 IMPLANT
GUIDEWIRE STR DUAL SENSOR (WIRE) IMPLANT
HOLDER FOLEY CATH W/STRAP (MISCELLANEOUS) ×3 IMPLANT
KIT TURNOVER CYSTO (KITS) ×3 IMPLANT
MANIFOLD NEPTUNE II (INSTRUMENTS) IMPLANT
NEEDLE ASPIRATION 22 (NEEDLE) IMPLANT
NEEDLE HYPO 18GX1.5 BLUNT FILL (NEEDLE) IMPLANT
NEEDLE HYPO 22GX1.5 SAFETY (NEEDLE) ×3 IMPLANT
PACK CYSTO (CUSTOM PROCEDURE TRAY) ×3 IMPLANT
SYR 20ML LL LF (SYRINGE) IMPLANT
TUBE CONNECTING 12'X1/4 (SUCTIONS)
TUBE CONNECTING 12X1/4 (SUCTIONS) IMPLANT
WATER STERILE IRR 3000ML UROMA (IV SOLUTION) ×3 IMPLANT

## 2019-12-19 NOTE — Anesthesia Procedure Notes (Signed)
Procedure Name: LMA Insertion Date/Time: 12/19/2019 12:57 PM Performed by: [place] Aanvi Voyles C, CRNA Pre-anesthesia Checklist: Patient identified, Emergency Drugs available, Suction available and Patient being monitored Patient Re-evaluated:Patient Re-evaluated prior to induction Oxygen Delivery Method: Circle system utilized Preoxygenation: Pre-oxygenation with 100% oxygen Induction Type: IV induction Ventilation: Mask ventilation without difficulty LMA: LMA inserted LMA Size: 4.0 Number of attempts: 1 Airway Equipment and Method: Bite block Placement Confirmation: positive ETCO2 Tube secured with: Tape Dental Injury: Teeth and Oropharynx as per pre-operative assessment

## 2019-12-19 NOTE — Anesthesia Preprocedure Evaluation (Signed)
Anesthesia Evaluation  Patient identified by MRN, date of birth, ID band Patient awake    Reviewed: Allergy & Precautions, H&P , NPO status , Patient's Chart, lab work & pertinent test results, reviewed documented beta blocker date and time   Airway Mallampati: I  TM Distance: >3 FB Neck ROM: full    Dental no notable dental hx. (+) Teeth Intact, Dental Advisory Given   Pulmonary neg pulmonary ROS, former smoker,    Pulmonary exam normal breath sounds clear to auscultation       Cardiovascular Exercise Tolerance: Good hypertension, Pt. on medications  Rhythm:regular Rate:Normal     Neuro/Psych  Headaches, Anxiety carotid ultrasound result right ICA 50-69%  negative psych ROS   GI/Hepatic negative GI ROS, Neg liver ROS,   Endo/Other  negative endocrine ROS  Renal/GU negative Renal ROS  negative genitourinary   Musculoskeletal  (+) Arthritis , Osteoarthritis,    Abdominal   Peds  Hematology negative hematology ROS (+)   Anesthesia Other Findings   Reproductive/Obstetrics negative OB ROS                             Anesthesia Physical  Anesthesia Plan  ASA: II  Anesthesia Plan: General   Post-op Pain Management:    Induction: Intravenous  PONV Risk Score and Plan: 2 and Ondansetron, Midazolam and Treatment may vary due to age or medical condition  Airway Management Planned: LMA  Additional Equipment:   Intra-op Plan:   Post-operative Plan: Extubation in OR  Informed Consent: I have reviewed the patients History and Physical, chart, labs and discussed the procedure including the risks, benefits and alternatives for the proposed anesthesia with the patient or authorized representative who has indicated his/her understanding and acceptance.     Dental Advisory Given  Plan Discussed with: CRNA, Anesthesiologist and Surgeon  Anesthesia Plan Comments: (  )         Anesthesia Quick Evaluation

## 2019-12-19 NOTE — Brief Op Note (Signed)
12/19/2019  12:47 PM  PATIENT:  Jim Mathis.  68 y.o. male  PRE-OPERATIVE DIAGNOSIS:  URETHRAL STRICTURE, MEATAL STENOSIS  POST-OPERATIVE DIAGNOSIS:  * No post-op diagnosis entered *  PROCEDURE:  Procedure(s) with comments: CYSTOSCOPY WITH URETHRAL DILATATION (N/A) - 45 MINS CYSTOSCOPY WITH INJECTION (N/A)  SURGEON:  Surgeon(s) and Role:    * Alexis Frock, MD - Primary  PHYSICIAN ASSISTANT:   ASSISTANTS: none   ANESTHESIA:   general  EBL:  minimal   BLOOD ADMINISTERED:none  DRAINS: 13F foley to gravity   LOCAL MEDICATIONS USED:  NONE  SPECIMEN:  No Specimen  DISPOSITION OF SPECIMEN:  N/A  COUNTS:  YES  TOURNIQUET:  * No tourniquets in log *  DICTATION: .Other Dictation: Dictation Number [id number]  PLAN OF CARE: Discharge to home after PACU  PATIENT DISPOSITION:  PACU - hemodynamically stable.   Delay start of Pharmacological VTE agent (>24hrs) due to surgical blood loss or risk of bleeding: yes

## 2019-12-19 NOTE — H&P (Signed)
Jim Mathis. is an 68 y.o. male.    Chief Complaint: Pre-OP Urethral Dilation / Cystoscopy  HPI:   1 - Lower Urinary Tract Symptoms/ distal urethral stricture - s/p TURP 07/2019 for mix of obstructive and irritaive symptoms. Path benign. Pre-TURP: nocutria x 7, tamsulosin, daily tadalafil, oxybutynin, finasteride !Marland Kitchen DRE 07/2019 50gm smooth. PVR 07/2019 " " (acceptable). UA bland.   Current Status:  11/2019 - PVR 153 (stable) remains on tadalifil and finasteride and now working with PT to help with urge supression / bhavioral modification.   2 - Prostate Screening -  07/2019 - PSA 1.77 on finasteride / DRE 50gm smooth.   PMH sig for sigmoid resection / colostomy / reversal (open midline), total knee. NO ischemic CV disease / blood thinners. 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 His PCP is Jacquiline Doe MD with [facility].   Today " Jim Mathis " is seen to proceed with urethral dilation for distal stricture. No interval fevers. C19 screen negative.     Past Medical History:  Diagnosis Date  . BPH with obstruction/lower urinary tract symptoms   . Carotid stenosis, right    per pt was told he has some stenosis but not enough for surgery;   in epic under media ,  carotid ultrasound result right ICA 50-69% and incidental finding thyroid nodule  . Cervicalgia   . Jim Mathis (erectile dysfunction)   . History of colon resection    per pt 12 hours after partial knee arthroplasty 2017,  s/p partial colectomy was possible from divertiulitis  . Hyperlipidemia   . Hypertension   . Migraines   . PAD (peripheral artery disease) (HCC) 08-14-2019  per pt when he walks (walks 2 miles a day) right foot gets numb but recovers quickly and when he rides his bike left hand gets numb by recovers quickly ;   denies claudication or swelling   per pt s/p  stenting to bilateral lower legs in [place] one in 2003 and the other 2007,  no follow up since moved to [facility]  . Wears glasses     Past Surgical  History:  Procedure Laterality Date  . COLOSTOMY REVERSAL  02-21-2017     [place], [place] (OR record scanned in epic)   w/ sigmoid and descending colectomy with appendectomy  . ENDOVASCULAR STENT INSERTION  2003 and 2007  --- both done in [place]   bilateral legs  . PARTIAL COLECTOMY  09-04-2016  in [place], [place]   w/  colostomy for perferated divierticulitis  . PARTIAL KNEE ARTHROPLASTY Left 2017  . TOTAL KNEE ARTHROPLASTY Right 2012  . TRANSURETHRAL RESECTION OF PROSTATE N/A 08/15/2019   Procedure: TRANSURETHRAL RESECTION OF THE PROSTATE (TURP);  Surgeon: Sebastian Ache, MD;  Location: [facility];  Service: Urology;  Laterality: N/A;    Family History  Problem Relation Age of Onset  . Alzheimer's disease Mother   . Lung cancer Father        non smoker   Social History:  reports that he quit smoking about 25 years ago. His smoking use included cigarettes. He quit after 20.00 years of use. He has never used smokeless tobacco. He reports current alcohol use. He reports that he does not use drugs.  Allergies: No Known Allergies  No medications prior to admission.    No results found for this or any previous visit (from the past 48 hour(s)). No results found.  Review of Systems  Constitutional: Negative for chills and fever.  Genitourinary: Positive for frequency and urgency.  All other systems reviewed and are negative.   Height 5\' 10"  (1.778 m), weight 86.2 kg. Physical Exam  Constitutional: He appears well-developed.  HENT:  Head: Normocephalic.  Eyes: Pupils are equal, round, and reactive to light.  Cardiovascular: Normal rate.  Respiratory: Effort normal.  GI: Soft.  Musculoskeletal:        General: Normal range of motion.     Cervical back: Normal range of motion.  Neurological: He is alert.  Skin: Skin is warm.  Psychiatric: He has a normal mood and affect.     Assessment/Plan  Proceed as planned with cysto, urethral dilation, possible Mito-C  injection. Risks, benefits, alternatives, expected peri-op course includign short term catheter discussed previously and reiterated today.   Alexis Frock, MD 12/19/2019, 7:04 AM

## 2019-12-19 NOTE — Anesthesia Postprocedure Evaluation (Signed)
Anesthesia Post Note  Patient: Jim Mathis.  Procedure(s) Performed: CYSTOSCOPY WITH URETHRAL DILATATION (N/A ) CYSTOSCOPY WITH INJECTION (N/A )     Patient location during evaluation: PACU Anesthesia Type: General Level of consciousness: awake and alert Pain management: pain level controlled Vital Signs Assessment: post-procedure vital signs reviewed and stable Respiratory status: spontaneous breathing, nonlabored ventilation and respiratory function stable Cardiovascular status: blood pressure returned to baseline and stable Postop Assessment: no apparent nausea or vomiting Anesthetic complications: no    Last Vitals:  Vitals:   12/19/19 1330 12/19/19 1400  BP: 116/86 139/75  Pulse: 68 71  Resp: (!) 8 14  Temp:    SpO2: 97% 94%    Last Pain:  Vitals:   12/19/19 1400  TempSrc:   PainSc: 0-No pain                 Lynda Rainwater

## 2019-12-19 NOTE — Discharge Instructions (Signed)
1 - You may have urinary urgency (bladder spasms) and bloody urine on / off with catheter in place. This is normal.  2 - Call MD or go to ER for fever >102, severe pain / nausea / vomiting not relieved by medications, or acute change in medical status   Post Anesthesia Home Care Instructions  Activity: Get plenty of rest for the remainder of the day. A responsible individual must stay with you for 24 hours following the procedure.  For the next 24 hours, DO NOT: -Drive a car -Paediatric nurse -Drink alcoholic beverages -Take any medication unless instructed by your physician -Make any legal decisions or sign important papers.  Meals: Start with liquid foods such as gelatin or soup. Progress to regular foods as tolerated. Avoid greasy, spicy, heavy foods. If nausea and/or vomiting occur, drink only clear liquids until the nausea and/or vomiting subsides. Call your physician if vomiting continues.  Special Instructions/Symptoms: Your throat may feel dry or sore from the anesthesia or the breathing tube placed in your throat during surgery. If this causes discomfort, gargle with warm salt water. The discomfort should disappear within 24 hours.   Urethral Dilation  What can I expect after the procedure?  After the procedure, it is common to have: ? Burning pain when urinating. ? Blood in your urine. ? A need to urinate frequently.  You will be asked to urinate before you leave the hospital or clinic.  Your urine flow should improve within a few days. Follow these instructions at home: Medicines  Take over-the-counter and prescription medicines only as told by your health care provider.  If you were prescribed an antibiotic medicine, take it as told by your health care provider. Do not stop taking the antibiotic even if you start to feel better.  Ask your health care provider if the medicine prescribed to you: ? Requires you to avoid driving or using heavy machinery. ? Can  cause constipation. You may need to take these actions to prevent or treat constipation:  Take over-the-counter or prescription medicines.  Eat foods that are high in fiber, such as beans, whole grains, and fresh fruits and vegetables.  Limit foods that are high in fat and processed sugars, such as fried or sweet foods. General instructions  Do not drive for 24 hours if you were given a sedative during your procedure.  If you were sent home with a small, lubricated tube (catheter) to help keep your urethra open, follow your health care provider's instructions about how and when to use it.  Drink enough fluid to keep your urine pale yellow.  Return to your normal activities as told by your health care provider. Ask your health care provider what activities are safe for you.  Keep all follow-up visits as told by your health care provider. This is important. Contact a health care provider if:  Your urine is cloudy and smells bad.  You develop new bleeding when you urinate.  You pass blood clots when you urinate.  You have pain that does not get better with medicine.  You have a fever.  You have swelling, bruising, or discoloration of your genital area. This includes the penis, scrotum, and inner thighs for men, and the outer genital organs (vulva) and inner thighs for women. Get help right away if:  You develop new bleeding that does not stop.  You cannot pass urine. Summary  Urethral dilation is a procedure to stretch open (dilate) the urethra.  Urethral dilation is usually  done to treat narrowing of the urethra (urethral stricture), which can make it difficult to pass urine.  Ask your health care provider about changing or stopping your regular medicines before the procedure.  After the procedure, it is common to have burning pain when urinating, blood in your urine, and a need to urinate frequently. This information is not intended to replace advice given to you by your  health care provider. Make sure you discuss any questions you have with your health care provider. Document Released: 01/02/2016 Document Revised: 01/18/2019 Document Reviewed: 01/18/2019 Elsevier Patient Education  2020 [employer]   Indwelling Urinary Catheter Care, Adult An indwelling urinary catheter is a thin tube that is put into your bladder. The tube helps to drain pee (urine) out of your body. The tube goes in through your urethra. Your urethra is where pee comes out of your body. Your pee will come out through the catheter, then it will go into a bag (drainage bag). Take good care of your catheter so it will work well. How to wear your catheter and bag Supplies needed  Sticky tape (adhesive tape) or a leg strap.  Alcohol wipe or soap and water (if you use tape).  A clean towel (if you use tape).  Large overnight bag.  Smaller bag (leg bag). Wearing your catheter Attach your catheter to your leg with tape or a leg strap.  Make sure the catheter is not pulled tight.  If a leg strap gets wet, take it off and put on a dry strap.  If you use tape to hold the bag on your leg: 1. Use an alcohol wipe or soap and water to wash your skin where the tape made it sticky before. 2. Use a clean towel to pat-dry that skin. 3. Use new tape to make the bag stay on your leg. Wearing your bags You should have been given a large overnight bag.  You may wear the overnight bag in the day or night.  Always have the overnight bag lower than your bladder.  Do not let the bag touch the floor.  Before you go to sleep, put a clean plastic bag in a wastebasket. Then hang the overnight bag inside the wastebasket. You should also have a smaller leg bag that fits under your clothes.  Always wear the leg bag below your knee.  Do not wear your leg bag at night. How to care for your skin and catheter Supplies needed  A clean washcloth.  Water and mild soap.  A clean towel. Caring for  your skin and catheter      Clean the skin around your catheter every day: ? Wash your hands with soap and water. ? Wet a clean washcloth in warm water and mild soap. ? Clean the skin around your urethra. ? If you are male: ? Gently spread the folds of skin around your vagina (labia). ? With the washcloth in your other hand, wipe the inner side of your labia on each side. Wipe from front to back. ? If you are male: ? Pull back any skin that covers the end of your penis (foreskin). ? With the washcloth in your other hand, wipe your penis in small circles. Start wiping at the tip of your penis, then move away from the catheter. ? Move the foreskin back in place, if needed. ? With your free hand, hold the catheter close to where it goes into your body. ? Keep holding the catheter during cleaning  so it does not get pulled out. ? With the washcloth in your other hand, clean the catheter. ? Only wipe downward on the catheter. ? Do not wipe upward toward your body. Doing this may push germs into your urethra and cause infection. ? Use a clean towel to pat-dry the catheter and the skin around it. Make sure to wipe off all soap. ? Wash your hands with soap and water.  Shower every day. Do not take baths.  Do not use cream, ointment, or lotion on the area where the catheter goes into your body, unless your doctor tells you to.  Do not use powders, sprays, or lotions on your genital area.  Check your skin around the catheter every day for signs of infection. Check for: ? Redness, swelling, or pain. ? Fluid or blood. ? Warmth. ? Pus or a bad smell. How to empty the bag Supplies needed  Rubbing alcohol.  Gauze pad or cotton ball.  Tape or a leg strap. Emptying the bag Pour the pee out of your bag when it is ?- full, or at least 2-3 times a day. Do this for your overnight bag and your leg bag. 1. Wash your hands with soap and water. 2. Separate (detach) the bag from your  leg. 3. Hold the bag over the toilet or a clean pail. Keep the bag lower than your hips and bladder. This is so the pee (urine) does not go back into the tube. 4. Open the pour spout. It is at the bottom of the bag. 5. Empty the pee into the toilet or pail. Do not let the pour spout touch any surface. 6. Put rubbing alcohol on a gauze pad or cotton ball. 7. Use the gauze pad or cotton ball to clean the pour spout. 8. Close the pour spout. 9. Attach the bag to your leg with tape or a leg strap. 10. Wash your hands with soap and water. Follow instructions for cleaning the drainage bag:  From the product maker.  As told by your doctor. How to change the bag Supplies needed  Alcohol wipes.  A clean bag.  Tape or a leg strap. Changing the bag Replace your bag when it starts to leak, smell bad, or look dirty. 1. Wash your hands with soap and water. 2. Separate the dirty bag from your leg. 3. Pinch the catheter with your fingers so that pee does not spill out. 4. Separate the catheter tube from the bag tube where these tubes connect (at the connection valve). Do not let the tubes touch any surface. 5. Clean the end of the catheter tube with an alcohol wipe. Use a different alcohol wipe to clean the end of the bag tube. 6. Connect the catheter tube to the tube of the clean bag. 7. Attach the clean bag to your leg with tape or a leg strap. Do not make the bag tight on your leg. 8. Wash your hands with soap and water. General rules   Never pull on your catheter. Never try to take it out. Doing that can hurt you.  Always wash your hands before and after you touch your catheter or bag. Use a mild, fragrance-free soap. If you do not have soap and water, use hand sanitizer.  Always make sure there are no twists or bends (kinks) in the catheter tube.  Always make sure there are no leaks in the catheter or bag.  Drink enough fluid to keep your pee pale yellow.  Do not take baths, swim,  or use a hot tub.  If you are male, wipe from front to back after you poop (have a bowel movement). Contact a doctor if:  Your pee is cloudy.  Your pee smells worse than usual.  Your catheter gets clogged.  Your catheter leaks.  Your bladder feels full. Get help right away if:  You have redness, swelling, or pain where the catheter goes into your body.  You have fluid, blood, pus, or a bad smell coming from the area where the catheter goes into your body.  Your skin feels warm where the catheter goes into your body.  You have a fever.  You have pain in your: ? Belly (abdomen). ? Legs. ? Lower back. ? Bladder.  You see blood in the catheter.  Your pee is pink or red.  You feel sick to your stomach (nauseous).  You throw up (vomit).  You have chills.  Your pee is not draining into the bag.  Your catheter gets pulled out. Summary  An indwelling urinary catheter is a thin tube that is placed into the bladder to help drain pee (urine) out of the body.  The catheter is placed into the part of the body that drains pee from the bladder (urethra).  Taking good care of your catheter will keep it working properly and help prevent problems.  Always wash your hands before and after touching your catheter or bag.  Never pull on your catheter or try to take it out. This information is not intended to replace advice given to you by your health care provider. Make sure you discuss any questions you have with your health care provider. Document Released: 04/02/2013 Document Revised: 03/30/2019 Document Reviewed: 07/22/2017 Elsevier Patient Education  2020 [employer].

## 2019-12-19 NOTE — Transfer of Care (Signed)
Immediate Anesthesia Transfer of Care Note  Patient: Jim Mathis.  Procedure(s) Performed: CYSTOSCOPY WITH URETHRAL DILATATION (N/A ) CYSTOSCOPY WITH INJECTION (N/A )  Patient Location: PACU  Anesthesia Type:General  Level of Consciousness: awake, alert  and oriented  Airway & Oxygen Therapy: Patient Spontanous Breathing and Patient connected to nasal cannula oxygen  Post-op Assessment: Report given to RN and Post -op Vital signs reviewed and stable  Post vital signs: Reviewed and stable  Last Vitals:  Vitals Value Taken Time  BP 125/68 12/19/19 1305  Temp    Pulse 75 12/19/19 1306  Resp 16 12/19/19 1306  SpO2 100 % 12/19/19 1306  Vitals shown include unvalidated device data.  Last Pain:  Vitals:   12/19/19 0950  TempSrc: Temporal  PainSc: 0-No pain      Patients Stated Pain Goal: 3 (41/63/[id number])  Complications: No apparent anesthesia complications

## 2019-12-20 NOTE — Op Note (Signed)
NAME: Jim Mathis, Jim Mathis MEDICAL RECORD [id number] ACCOUNT [id number] DATE OF BIRTH:12-16-1951 FACILITY: [facility] LOCATION: [facility] PHYSICIAN:Caisen Mangas Tresa Moore, MD  OPERATIVE REPORT  DATE OF PROCEDURE:  12/19/2019  SURGEON:  Alexis Frock, MD  PREOPERATIVE DIAGNOSIS:  Distal urethral stricture.    POSTOPERATIVE DIAGNOSIS:  Distal urethral stricture.  PROCEDURE: 1.  Cystoscopy. 2.  Urethral dilation.  ESTIMATED BLOOD LOSS:  Nil.  COMPLICATIONS:  None.  SPECIMENS:  None.  FINDINGS:   1.  Approximately 5-French diameter distal - meatus urethral stricture, predilation; 24-French postdilation.   2.  Otherwise, unremarkable urethra. 3.  Prior TURP defect with wide open prostate fossa.  INDICATIONS:  The patient is a pleasant 68 year old man with long history of obstructive voiding symptoms refractory to medical therapy.  He underwent a transurethral resection of the prostate several months ago, which had had a great immediate  postoperative result with complete resolution of obstructive and irritative symptoms.  He unfortunately developed recurrence of obstructing irritative symptoms that are quite bothersome.  He was evaluated in the office and found to have a distal urethral  stricture that was fairly high grade.  Options for management, including recommended path of trial of urethral dilation with injection of an antineoplastic to help reduce recurrence risk and he wished to proceed.  Informed consent was obtained and  placed in the medical record.  DESCRIPTION OF PROCEDURE:  The patient being identified, procedure being cystoscopy, urethral dilation was confirmed, procedure timeout was performed.  Intravenous antibiotics administered.  General anesthesia induced.  The patient was placed into a low  lithotomy position.  Sterile field was created, prepping and draping his penis, perineum and proximal thighs using iodine.  Next, a 0.03 ZIPwire was advanced under fluoroscopic guidance  to the level of the urinary bladder.  As the stricture was distal  and would not accommodate formal dilators at this point, using a combination of direct visualization and fluoroscopic guidance, a 24-French NephroMax balloon dilation apparatus was carefully advanced across the stricture and inflated to a pressure of 20  atmospheres, held for 90 seconds and released.  Next, cystourethroscopy was performed using a 24-French rigid cystoscope with offset lens, placed in the anterior and posterior urethra, revealed no evidence of additional strictures.  There was a wide open  prostate fossa, unremarkable urinary bladder.  Careful inspection of the urethral mucosa revealed the strictured area was indeed just distal within the last fifth of the urethra in the fossa navicularis area.  Next, approximately 1 mL corresponding to  0.1 mg of mitomycin was injected in submucosal fashion at the area of dilation over a quadrant fashion to help reduce recurrence and a 22-French Council catheter was placed in urethra to straight drain, 10 mL of sterile water in the balloon.  The  procedure was terminated.  The patient tolerated the procedure well.  No immediate perioperative complications.  The patient was taken to postanesthesia area in stable condition.  Plan for discharge home.  VN/NUANCE  D:12/19/2019 T:12/19/2019 JOB:[id number]

## 2019-12-24 ENCOUNTER — Other Ambulatory Visit: Payer: Self-pay | Admitting: Family Medicine

## 2019-12-24 DIAGNOSIS — N401 Enlarged prostate with lower urinary tract symptoms: Secondary | ICD-10-CM | POA: Diagnosis not present

## 2019-12-24 DIAGNOSIS — N5201 Erectile dysfunction due to arterial insufficiency: Secondary | ICD-10-CM | POA: Diagnosis not present

## 2019-12-24 DIAGNOSIS — R3915 Urgency of urination: Secondary | ICD-10-CM | POA: Diagnosis not present

## 2019-12-24 DIAGNOSIS — N35819 Other urethral stricture, male, unspecified site: Secondary | ICD-10-CM | POA: Diagnosis not present

## 2020-01-08 ENCOUNTER — Encounter: Payer: Self-pay | Admitting: Family Medicine

## 2020-01-08 NOTE — Telephone Encounter (Signed)
Patient calling in if he can get this in since he been out for 5 days. Still waiting on the medication to come in the mail.

## 2020-01-09 MED ORDER — DOXEPIN HCL 25 MG PO CAPS: 25.0000 mg | ORAL_CAPSULE | Freq: Every day | ORAL | 0 refills | Status: DC

## 2020-01-18 ENCOUNTER — Encounter: Payer: Self-pay | Admitting: Family Medicine

## 2020-01-23 ENCOUNTER — Encounter: Payer: Self-pay | Admitting: Family Medicine

## 2020-02-28 ENCOUNTER — Telehealth: Payer: Self-pay | Admitting: Family Medicine

## 2020-02-28 NOTE — Telephone Encounter (Signed)
awv 03/11/20 at 10:30am

## 2020-02-28 NOTE — Telephone Encounter (Signed)
Called pt. No answer/VM full. Pt due to schedule Medicare Annual Wellness Visit (AWV) either virtually/audio only OR in office. Whatever the patients preference is. ° °No hx; please schedule at anytime with [facility]-Nurse Health Advisor at [place] [facility] [place]. ° ° °

## 2020-03-03 ENCOUNTER — Other Ambulatory Visit: Payer: Self-pay | Admitting: Family Medicine

## 2020-03-11 ENCOUNTER — Ambulatory Visit (INDEPENDENT_AMBULATORY_CARE_PROVIDER_SITE_OTHER): Payer: PPO

## 2020-03-11 ENCOUNTER — Other Ambulatory Visit: Payer: Self-pay

## 2020-03-11 VITALS — BP 136/74 | HR 74 | Temp 98.2°F | Ht 70.0 in | Wt 192.2 lb

## 2020-03-11 DIAGNOSIS — Z0000 Encounter for general adult medical examination without abnormal findings: Secondary | ICD-10-CM | POA: Diagnosis not present

## 2020-03-11 MED ORDER — AMLODIPINE BESYLATE 10 MG PO TABS: 10.0000 mg | ORAL_TABLET | Freq: Every day | ORAL | 0 refills | Status: DC

## 2020-03-11 MED ORDER — ATORVASTATIN CALCIUM 40 MG PO TABS: 40.0000 mg | ORAL_TABLET | Freq: Every day | ORAL | 0 refills | Status: DC

## 2020-03-11 MED ORDER — DOXEPIN HCL 25 MG PO CAPS: 25.0000 mg | ORAL_CAPSULE | Freq: Every day | ORAL | 0 refills | Status: DC

## 2020-03-11 MED ORDER — FINASTERIDE 5 MG PO TABS: 5.0000 mg | ORAL_TABLET | Freq: Every day | ORAL | 0 refills | Status: DC

## 2020-03-11 NOTE — Patient Instructions (Signed)
Jim Mathis , Thank you for taking time to come for your Medicare Wellness Visit. I appreciate your ongoing commitment to your health goals. Please review the following plan we discussed and let me know if I can assist you in the future.   Screening recommendations/referrals: Colorectal Screening: up to date; last colonoscopy 01/26/17  Vision and Dental Exams: Recommended annual ophthalmology exams for early detection of glaucoma and other disorders of the eye Recommended annual dental exams for proper oral hygiene  Vaccinations: Influenza vaccine: completed 07/25/19 Pneumococcal vaccine: up to date; last 01/24/17 Tdap vaccine: recommended;  Please call your insurance company to determine your out of pocket expense. You may receive this vaccine at your local pharmacy or Health Dept. Shingles vaccine: You may receive this vaccine at your local pharmacy. (see handout)   Advanced directives:Please bring a copy of your POA (Power of Attorney) and/or Living Will to your next appointment.  Goals: Recommend to drink at least 6-8 8oz glasses of water per day and consume a balanced diet rich in fresh fruits and vegetables.   Appointment: Please schedule your Annual Wellness Visit with your Nurse Health Advisor in one year.  Preventive Care 70 Years and Older, Male Preventive care refers to lifestyle choices and visits with your health care provider that can promote health and wellness. What does preventive care include?  A yearly physical exam. This is also called an annual well check.  Dental exams once or twice a year.  Routine eye exams. Ask your health care provider how often you should have your eyes checked.  Personal lifestyle choices, including:  Daily care of your teeth and gums.  Regular physical activity.  Eating a healthy diet.  Avoiding tobacco and drug use.  Limiting alcohol use.  Practicing safe sex.  Taking low doses of aspirin every day if recommended by your health  care provider..  Taking vitamin and mineral supplements as recommended by your health care provider. What happens during an annual well check? The services and screenings done by your health care provider during your annual well check will depend on your age, overall health, lifestyle risk factors, and family history of disease. Counseling  Your health care provider may ask you questions about your:  Alcohol use.  Tobacco use.  Drug use.  Emotional well-being.  Home and relationship well-being.  Sexual activity.  Eating habits.  History of falls.  Memory and ability to understand (cognition).  Work and work Astronomer. Screening  You may have the following tests or measurements:  Height, weight, and BMI.  Blood pressure.  Lipid and cholesterol levels. These may be checked every 5 years, or more frequently if you are over 70 years old.  Skin check.  Lung cancer screening. You may have this screening every year starting at age 70 if you have a 30-pack-year history of smoking and currently smoke or have quit within the past 15 years.  Fecal occult blood test (FOBT) of the stool. You may have this test every year starting at age 70.  Flexible sigmoidoscopy or colonoscopy. You may have a sigmoidoscopy every 5 years or a colonoscopy every 10 years starting at age 6.  Prostate cancer screening. Recommendations will vary depending on your family history and other risks.  Hepatitis C blood test.  Hepatitis B blood test.  Sexually transmitted disease (STD) testing.  Diabetes screening. This is done by checking your blood sugar (glucose) after you have not eaten for a while (fasting). You may have this done every  1-3 years.  Abdominal aortic aneurysm (AAA) screening. You may need this if you are a current or former smoker.  Osteoporosis. You may be screened starting at age 12 if you are at high risk. Talk with your health care provider about your test results,  treatment options, and if necessary, the need for more tests. Vaccines  Your health care provider may recommend certain vaccines, such as:  Influenza vaccine. This is recommended every year.  Tetanus, diphtheria, and acellular pertussis (Tdap, Td) vaccine. You may need a Td booster every 10 years.  Zoster vaccine. You may need this after age 70.  Pneumococcal 13-valent conjugate (PCV13) vaccine. One dose is recommended after age 70.  Pneumococcal polysaccharide (PPSV23) vaccine. One dose is recommended after age 6. Talk to your health care provider about which screenings and vaccines you need and how often you need them. This information is not intended to replace advice given to you by your health care provider. Make sure you discuss any questions you have with your health care provider. Document Released: 01/02/2016 Document Revised: 08/25/2016 Document Reviewed: 10/07/2015 Elsevier Interactive Patient Education  2017 [place] Prevention in the Home Falls can cause injuries. They can happen to people of all ages. There are many things you can do to make your home safe and to help prevent falls. What can I do on the outside of my home?  Regularly fix the edges of walkways and driveways and fix any cracks.  Remove anything that might make you trip as you walk through a door, such as a raised step or threshold.  Trim any bushes or trees on the path to your home.  Use bright outdoor lighting.  Clear any walking paths of anything that might make someone trip, such as rocks or tools.  Regularly check to see if handrails are loose or broken. Make sure that both sides of any steps have handrails.  Any raised decks and porches should have guardrails on the edges.  Have any leaves, snow, or ice cleared regularly.  Use sand or salt on walking paths during winter.  Clean up any spills in your garage right away. This includes oil or grease spills. What can I do in the  bathroom?  Use night lights.  Install grab bars by the toilet and in the tub and shower. Do not use towel bars as grab bars.  Use non-skid mats or decals in the tub or shower.  If you need to sit down in the shower, use a plastic, non-slip stool.  Keep the floor dry. Clean up any water that spills on the floor as soon as it happens.  Remove soap buildup in the tub or shower regularly.  Attach bath mats securely with double-sided non-slip rug tape.  Do not have throw rugs and other things on the floor that can make you trip. What can I do in the bedroom?  Use night lights.  Make sure that you have a light by your bed that is easy to reach.  Do not use any sheets or blankets that are too big for your bed. They should not hang down onto the floor.  Have a firm chair that has side arms. You can use this for support while you get dressed.  Do not have throw rugs and other things on the floor that can make you trip. What can I do in the kitchen?  Clean up any spills right away.  Avoid walking on wet floors.  Keep items  that you use a lot in easy-to-reach places.  If you need to reach something above you, use a strong step stool that has a grab bar.  Keep electrical cords out of the way.  Do not use floor polish or wax that makes floors slippery. If you must use wax, use non-skid floor wax.  Do not have throw rugs and other things on the floor that can make you trip. What can I do with my stairs?  Do not leave any items on the stairs.  Make sure that there are handrails on both sides of the stairs and use them. Fix handrails that are broken or loose. Make sure that handrails are as long as the stairways.  Check any carpeting to make sure that it is firmly attached to the stairs. Fix any carpet that is loose or worn.  Avoid having throw rugs at the top or bottom of the stairs. If you do have throw rugs, attach them to the floor with carpet tape.  Make sure that you have a  light switch at the top of the stairs and the bottom of the stairs. If you do not have them, ask someone to add them for you. What else can I do to help prevent falls?  Wear shoes that:  Do not have high heels.  Have rubber bottoms.  Are comfortable and fit you well.  Are closed at the toe. Do not wear sandals.  If you use a stepladder:  Make sure that it is fully opened. Do not climb a closed stepladder.  Make sure that both sides of the stepladder are locked into place.  Ask someone to hold it for you, if possible.  Clearly mark and make sure that you can see:  Any grab bars or handrails.  First and last steps.  Where the edge of each step is.  Use tools that help you move around (mobility aids) if they are needed. These include:  Canes.  Walkers.  Scooters.  Crutches.  Turn on the lights when you go into a dark area. Replace any light bulbs as soon as they burn out.  Set up your furniture so you have a clear path. Avoid moving your furniture around.  If any of your floors are uneven, fix them.  If there are any pets around you, be aware of where they are.  Review your medicines with your doctor. Some medicines can make you feel dizzy. This can increase your chance of falling. Ask your doctor what other things that you can do to help prevent falls. This information is not intended to replace advice given to you by your health care provider. Make sure you discuss any questions you have with your health care provider. Document Released: 10/02/2009 Document Revised: 05/13/2016 Document Reviewed: 01/10/2015 Elsevier Interactive Patient Education  2017 [employer].

## 2020-03-11 NOTE — Progress Notes (Signed)
Subjective:   Jim Mathis. is a 69 y.o. male who presents for an Initial Medicare Annual Wellness Visit.  Review of Systems   Cardiac Risk Factors include: advanced age (>64men, >74 women);male gender;hypertension   Objective:    Today's Vitals   03/11/20 1033  BP: 136/74  Pulse: 74  Temp: 98.2 F (36.8 C)  TempSrc: Temporal  SpO2: 96%  Weight: 192 lb 3.2 oz (87.2 kg)  Height: 5\' 10"  (1.778 m)   Body mass index is 27.58 kg/m.  Advanced Directives 03/11/2020 12/19/2019 08/15/2019 06/06/2019  Does Patient Have a Medical Advance Directive? Yes Yes Yes Yes  Type of Advance Directive Living will;Healthcare Power of [place];Living will -  Does patient want to make changes to medical advance directive? No - Patient declined No - Patient declined - -  Copy of [place] in Chart? No - copy requested Yes - validated most recent copy scanned in chart (See row information) Yes - validated most recent copy scanned in chart (See row information) -    Current Medications (verified) Outpatient Encounter Medications as of 03/11/2020  Medication Sig  . amLODipine (NORVASC) 10 MG tablet Take 1 tablet by mouth daily  . atorvastatin (LIPITOR) 40 MG tablet Take 1 tablet (40 mg total) by mouth daily. (Patient taking differently: Take 40 mg by mouth daily. )  . doxepin (SINEQUAN) 25 MG capsule Take 1 capsule (25 mg total) by mouth daily.  . finasteride (PROSCAR) 5 MG tablet Take 1 tablet (5 mg total) by mouth daily. (Patient taking differently: Take 5 mg by mouth at bedtime. )  . Naphazoline-Pheniramine (ALLERGY EYE OP) Apply to eye as needed.  . [DISCONTINUED] tadalafil (CIALIS) 20 MG tablet Take 0.5 tablets (10 mg total) by mouth daily. (Patient taking differently: Take 5 mg by mouth daily. )  . [DISCONTINUED] cephALEXin (KEFLEX) 500 MG capsule Take 1 capsule (500 mg total) by mouth 2 (two) times daily. Begin day  before next Urology appointment.  . [DISCONTINUED] traMADol (ULTRAM) 50 MG tablet Take 1-2 tablets (50-100 mg total) by mouth every 6 (six) hours as needed for moderate pain. Post-operatively   No facility-administered encounter medications on file as of 03/11/2020.    Allergies (verified) Patient has no known allergies.   History: Past Medical History:  Diagnosis Date  . BPH with obstruction/lower urinary tract symptoms   . Carotid stenosis, right    per pt was told he has some stenosis but not enough for surgery;   in epic under media ,  carotid ultrasound result right ICA 50-69% and incidental finding thyroid nodule  . Cervicalgia   . ED (erectile dysfunction)   . History of colon resection    per pt 12 hours after partial knee arthroplasty 2017,  s/p partial colectomy was possible from divertiulitis  . Hyperlipidemia   . Hypertension   . Migraines   . PAD (peripheral artery disease) ([place]) 08-14-2019  per pt when he walks (walks 2 miles a day) right foot gets numb but recovers quickly and when he rides his bike left hand gets numb by recovers quickly ;   denies claudication or swelling   per pt s/p  stenting to bilateral lower legs in [place] one in 2003 and the other 2007,  no follow up since moved to [facility]  . Wears glasses    Past Surgical History:  Procedure Laterality Date  . COLOSTOMY REVERSAL  02-21-2017  [place], [place] (OR record scanned in epic)   w/ sigmoid and descending colectomy with appendectomy  . CYSTOSCOPY WITH INJECTION N/A 12/19/2019   Procedure: CYSTOSCOPY WITH INJECTION;  Surgeon: Sebastian Ache, MD;  Location: [facility];  Service: Urology;  Laterality: N/A;  . CYSTOSCOPY WITH URETHRAL DILATATION N/A 12/19/2019   Procedure: CYSTOSCOPY WITH URETHRAL DILATATION;  Surgeon: Sebastian Ache, MD;  Location[facility];  Service: Urology;  Laterality: N/A;  45 MINS  . ENDOVASCULAR STENT INSERTION  2003 and 2007  --- both done in [place]    bilateral legs  . PARTIAL COLECTOMY  09-04-2016  in [place], [place]   w/  colostomy for perferated divierticulitis  . PARTIAL KNEE ARTHROPLASTY Left 2017  . TOTAL KNEE ARTHROPLASTY Right 2012  . TRANSURETHRAL RESECTION OF PROSTATE N/A 08/15/2019   Procedure: TRANSURETHRAL RESECTION OF THE PROSTATE (TURP);  Surgeon: Sebastian Ache, MD;  Location: [facility];  Service: Urology;  Laterality: N/A;   Family History  Problem Relation Age of Onset  . Alzheimer's disease Mother   . Lung cancer Father        non smoker   Social History   Socioeconomic History  . Marital status: Married    Spouse name: Not on file  . Number of children: Not on file  . Years of education: Not on file  . Highest education level: Not on file  Occupational History  . Not on file  Tobacco Use  . Smoking status: Former Smoker    Years: 20.00    Types: Cigarettes    Quit date: 05/23/1994    Years since quitting: 25.8  . Smokeless tobacco: Never Used  Substance and Sexual Activity  . Alcohol use: Yes    Comment: occasionally  . Drug use: Never  . Sexual activity: Yes  Other Topics Concern  . Not on file  Social History Narrative   Right handed   Lives in two story home with wife   Social Determinants of Health   Financial Resource Strain:   . Difficulty of Paying Living Expenses:   Food Insecurity:   . Worried About Programme researcher, broadcasting/film/video in the Last Year:   . Barista in the Last Year:   Transportation Needs:   . Freight forwarder (Medical):   Marland Kitchen Lack of Transportation (Non-Medical):   Physical Activity: Sufficiently Active  . Days of Exercise per Week: 4 days  . Minutes of Exercise per Session: 40 min  Stress:   . Feeling of Stress :   Social Connections:   . Frequency of Communication with Friends and Family:   . Frequency of Social Gatherings with Friends and Family:   . Attends Religious Services:   . Active Member of Clubs or Organizations:   . Attends Tax inspector Meetings:   Marland Kitchen Marital Status:    Tobacco Counseling Counseling given: Not Answered   Clinical Intake:  Pre-visit preparation completed: Yes  Pain : No/denies pain  Diabetes: No  How often do you need to have someone help you when you read instructions, pamphlets, or other written materials from your doctor or pharmacy?: 1 - Never  Interpreter Needed?: No  Information entered by :: Kandis Fantasia LPN  Activities of Daily Living In your present state of health, do you have any difficulty performing the following activities: 03/11/2020 12/19/2019  Hearing? N N  Vision? N N  Difficulty concentrating or making decisions? N N  Walking or climbing stairs?  N N  Dressing or bathing? N N  Doing errands, shopping? N -  Preparing Food and eating ? N -  Using the Toilet? N -  In the past six months, have you accidently leaked urine? N -  Do you have problems with loss of bowel control? N -  Managing your Medications? N -  Managing your Finances? N -  Housekeeping or managing your Housekeeping? N -  Some recent data might be hidden     Immunizations and Health Maintenance Immunization History  Administered Date(s) Administered  . Influenza, High Dose Seasonal PF 08/03/2019  . Pneumococcal Conjugate-13 01/25/2016  . Pneumococcal Polysaccharide-23 01/24/2017  . Zoster Recombinat (Shingrix) 07/15/2019   Health Maintenance Due  Topic Date Due  . Hepatitis C Screening  Never done    Patient Care Team: Ardith Dark, MD as PCP - General (Family Medicine) Drema Dallas, DO as Consulting Physician (Neurology) Sebastian Ache, MD as Consulting Physician (Urology)  Indicate any recent Medical Services you may have received from other than [facility] providers in the past year (date may be approximate).    Assessment:   This is a routine wellness examination for Izayah.  Hearing/Vision screen No exam data present  Dietary issues and exercise activities  discussed: Current Exercise Habits: Home exercise routine, Type of exercise: walking;Other - see comments(biking, hiking), Time (Minutes): 45, Frequency (Times/Week): 4, Weekly Exercise (Minutes/Week): 180, Intensity: Moderate  Goals   None    Depression Screen PHQ 2/9 Scores 03/11/2020 05/24/2019  PHQ - 2 Score 0 0  PHQ- 9 Score - 1    Fall Risk Fall Risk  03/11/2020 06/06/2019 05/24/2019  Falls in the past year? 0 0 0  Number falls in past yr: 0 - -  Injury with Fall? 0 - 0  Follow up Falls evaluation completed;Education provided;Falls prevention discussed - Falls evaluation completed    Is the patient's home free of loose throw rugs in walkways, pet beds, electrical cords, etc?   yes      Grab bars in the bathroom? yes      Handrails on the stairs?    yes      Adequate lighting?   yes  Timed Get Up and Go performed: completed and within normal timeframe; no gait abnormalities noted   Cognitive Function:     6CIT Screen 03/11/2020  What Year? 0 points  What month? 0 points  What time? 0 points  Count back from 20 0 points  Months in reverse 0 points  Repeat phrase 0 points  Total Score 0    Screening Tests Health Maintenance  Topic Date Due  . Hepatitis C Screening  Never done  . TETANUS/TDAP  05/23/2020 (Originally 09/20/1970)  . COLONOSCOPY  01/26/2027  . INFLUENZA VACCINE  Completed  . PNA vac Low Risk Adult  Completed    Qualifies for Shingles Vaccine? Discussed and patient will check with pharmacy for coverage.  Patient education handout provided   Cancer Screenings: Lung: Low Dose CT Chest recommended if Age 41-80 years, 30 pack-year currently smoking OR have quit w/in 15years. Patient does not qualify. Colorectal: colonoscopy 01/26/17    Plan:  I have personally reviewed and addressed the Medicare Annual Wellness questionnaire and have noted the following in the patient's chart:  A. Medical and social history B. Use of alcohol, tobacco or illicit drugs   C. Current medications and supplements D. Functional ability and status E.  Nutritional status F.  Physical activity G. Advance directives  H. List of other physicians I.  Hospitalizations, surgeries, and ER visits in previous 12 months J.  Vitals K. Screenings such as hearing and vision if needed, cognitive and depression L. Referrals, records requested, and appointments- none   In addition, I have reviewed and discussed with patient certain preventive protocols, quality metrics, and best practice recommendations. A written personalized care plan for preventive services as well as general preventive health recommendations were provided to patient.   Signed,  Kandis Fantasia, LPN  Nurse Health Advisor   Nurse Notes: Patient has completed Covid vaccine

## 2020-04-24 ENCOUNTER — Encounter: Payer: PPO | Admitting: Family Medicine

## 2020-05-06 ENCOUNTER — Encounter: Payer: Self-pay | Admitting: Family Medicine

## 2020-05-06 ENCOUNTER — Other Ambulatory Visit: Payer: Self-pay

## 2020-05-06 ENCOUNTER — Ambulatory Visit (INDEPENDENT_AMBULATORY_CARE_PROVIDER_SITE_OTHER): Payer: PPO | Admitting: Family Medicine

## 2020-05-06 ENCOUNTER — Other Ambulatory Visit: Payer: Self-pay | Admitting: *Deleted

## 2020-05-06 VITALS — BP 118/70 | HR 78 | Temp 97.6°F | Ht 70.0 in | Wt 188.2 lb

## 2020-05-06 DIAGNOSIS — L821 Other seborrheic keratosis: Secondary | ICD-10-CM | POA: Diagnosis not present

## 2020-05-06 DIAGNOSIS — E785 Hyperlipidemia, unspecified: Secondary | ICD-10-CM

## 2020-05-06 DIAGNOSIS — I10 Essential (primary) hypertension: Secondary | ICD-10-CM

## 2020-05-06 DIAGNOSIS — Z125 Encounter for screening for malignant neoplasm of prostate: Secondary | ICD-10-CM

## 2020-05-06 DIAGNOSIS — F419 Anxiety disorder, unspecified: Secondary | ICD-10-CM | POA: Diagnosis not present

## 2020-05-06 DIAGNOSIS — Z6827 Body mass index (BMI) 27.0-27.9, adult: Secondary | ICD-10-CM

## 2020-05-06 DIAGNOSIS — Z0001 Encounter for general adult medical examination with abnormal findings: Secondary | ICD-10-CM

## 2020-05-06 DIAGNOSIS — L570 Actinic keratosis: Secondary | ICD-10-CM | POA: Insufficient documentation

## 2020-05-06 DIAGNOSIS — N400 Enlarged prostate without lower urinary tract symptoms: Secondary | ICD-10-CM | POA: Diagnosis not present

## 2020-05-06 DIAGNOSIS — L989 Disorder of the skin and subcutaneous tissue, unspecified: Secondary | ICD-10-CM

## 2020-05-06 LAB — COMPREHENSIVE METABOLIC PANEL
ALT: 17 U/L (ref 0–53)
AST: 18 U/L (ref 0–37)
Albumin: 4.4 g/dL (ref 3.5–5.2)
Alkaline Phosphatase: 108 U/L (ref 39–117)
BUN: 15 mg/dL (ref 6–23)
CO2: 27 mEq/L (ref 19–32)
Calcium: 9.1 mg/dL (ref 8.4–10.5)
Chloride: 104 mEq/L (ref 96–112)
Creatinine, Ser: 0.87 mg/dL (ref 0.40–1.50)
GFR: 86.85 mL/min (ref >60.00)
Glucose, Bld: 94 mg/dL (ref 70–99)
Potassium: 4.7 mEq/L (ref 3.5–5.1)
Sodium: 136 mEq/L (ref 135–145)
Total Bilirubin: 0.4 mg/dL (ref 0.2–1.2)
Total Protein: 7.1 g/dL (ref 6.0–8.3)

## 2020-05-06 LAB — CBC
HCT: 43.8 % (ref 39.0–52.0)
Hemoglobin: 14.5 g/dL (ref 13.0–17.0)
MCHC: 33.1 g/dL (ref 30.0–36.0)
MCV: 92 fl (ref 78.0–100.0)
Platelets: 209 10*3/uL (ref 150.0–400.0)
RBC: 4.76 Mil/uL (ref 4.22–5.81)
RDW: 13.6 % (ref 11.5–15.5)
WBC: 5.4 10*3/uL (ref 4.0–10.5)

## 2020-05-06 LAB — PSA, MEDICARE: PSA: 0.65 ng/ml (ref 0.10–4.00)

## 2020-05-06 LAB — HEMOGLOBIN A1C: Hgb A1c MFr Bld: 5.6 % (ref 4.6–6.5)

## 2020-05-06 LAB — LIPID PANEL
Cholesterol: 170 mg/dL (ref 0–200)
HDL: 50.7 mg/dL (ref >39.00)
LDL Cholesterol: 102 mg/dL — ABNORMAL HIGH (ref 0–99)
NonHDL: 119.13
Total CHOL/HDL Ratio: 3
Triglycerides: 86 mg/dL (ref 0.0–149.0)
VLDL: 17.2 mg/dL (ref 0.0–40.0)

## 2020-05-06 LAB — TSH: TSH: 1.77 u[IU]/mL (ref 0.35–4.50)

## 2020-05-06 MED ORDER — TADALAFIL 20 MG PO TABS: 20.0000 mg | ORAL_TABLET | Freq: Every day | ORAL | 3 refills | Status: DC | PRN

## 2020-05-06 MED ORDER — DOXEPIN HCL 25 MG PO CAPS: 25.0000 mg | ORAL_CAPSULE | Freq: Every day | ORAL | 3 refills | Status: DC

## 2020-05-06 MED ORDER — FINASTERIDE 5 MG PO TABS: 5.0000 mg | ORAL_TABLET | Freq: Every day | ORAL | 3 refills | Status: DC

## 2020-05-06 MED ORDER — ATORVASTATIN CALCIUM 40 MG PO TABS: 40.0000 mg | ORAL_TABLET | Freq: Every day | ORAL | 3 refills | Status: DC

## 2020-05-06 MED ORDER — AMLODIPINE BESYLATE 10 MG PO TABS: 10.0000 mg | ORAL_TABLET | Freq: Every day | ORAL | 3 refills | Status: DC

## 2020-05-06 NOTE — Assessment & Plan Note (Signed)
Stable.  Continue Lipitor 40 mg daily.  Check lipid panel, CBC, C met, TSH.

## 2020-05-06 NOTE — Assessment & Plan Note (Signed)
Continue doxepin 25 mg daily.  He is tolerating well.  Discussed potential increased side effects as he ages however he would like to continue as he is experiencing minimal side effects currently.

## 2020-05-06 NOTE — Progress Notes (Signed)
Chief Complaint:  Jim Mathis. is a 69 y.o. male who presents today for his annual comprehensive physical exam.    Assessment/Plan:  New/Acute Problems: Actinic keratosis Cryotherapy applied today.  See below procedure note.  He tolerated well.  Suspicious skin lesion Punch biopsy performed today.  He tolerated well.  See below procedure note.  Chronic Problems Addressed Today: Anxiety Continue doxepin 25 mg daily.  He is tolerating well.  Discussed potential increased side effects as he ages however he would like to continue as he is experiencing minimal side effects currently.  Dyslipidemia Stable.  Continue Lipitor 40 mg daily.  Check lipid panel, CBC, C met, TSH.  Essential hypertension Stable.  Continue amlodipine 10 mg daily.  Check CBC, C met, TSH.  BPH with ED and OAB Stable.  Will refill finasteride 5 mg daily and Cialis 20 mg daily as needed.   Body mass index is 27 kg/m. / Overweight BMI Metric Follow Up - 05/06/20 1032      BMI Metric Follow Up-Please document annually   BMI Metric Follow Up  Education provided        Preventative Healthcare: Check CBC, C met, TSH, lipid panel.  Check PSA.  Up-to-date on Covid and shingles vaccine.  Up-to-date on other screenings.  Due for next colonoscopy in 7 years.  Patient Counseling(The following topics were reviewed and/or handout was given):  -Nutrition: Stressed importance of moderation in sodium/caffeine intake, saturated fat and cholesterol, caloric balance, sufficient intake of fresh fruits, vegetables, and fiber.  -Stressed the importance of regular exercise.   -Substance Abuse: Discussed cessation/primary prevention of tobacco, alcohol, or other drug use; driving or other dangerous activities under the influence; availability of treatment for abuse.   -Injury prevention: Discussed safety belts, safety helmets, smoke detector, smoking near bedding or upholstery.   -Sexuality: Discussed sexually transmitted  diseases, partner selection, use of condoms, avoidance of unintended pregnancy and contraceptive alternatives.   -Dental health: Discussed importance of regular tooth brushing, flossing, and dental visits.  -Health maintenance and immunizations reviewed. Please refer to Health maintenance section.  Return to care in 1 year for next preventative visit.     Subjective:  HPI:  He has o acute complaints today.   Lifestyle Diet: Balanced. Plenty of fruits and vegetables.  Exercise: Does a lot of hiking and biking.   Depression screen [facility] 2/9 03/11/2020  Decreased Interest 0  Down, Depressed, Hopeless 0  PHQ - 2 Score 0  Altered sleeping -  Tired, decreased energy -  Change in appetite -  Feeling bad or failure about yourself  -  Trouble concentrating -  Moving slowly or fidgety/restless -  Suicidal thoughts -  PHQ-9 Score -  Difficult doing work/chores -   Health Maintenance Due  Topic Date Due  . Hepatitis C Screening  Never done  . COVID-19 Vaccine (1) Never done    ROS: Per HPI, otherwise a complete review of systems was negative.   PMH:  The following were reviewed and entered/updated in epic: Past Medical History:  Diagnosis Date  . BPH with obstruction/lower urinary tract symptoms   . Carotid stenosis, right    per pt was told he has some stenosis but not enough for surgery;   in epic under media ,  carotid ultrasound result right ICA 50-69% and incidental finding thyroid nodule  . Cervicalgia   . ED (erectile dysfunction)   . History of colon resection    per pt 12 hours after partial  knee arthroplasty 2017,  s/p partial colectomy was possible from divertiulitis  . Hyperlipidemia   . Hypertension   . Migraines   . PAD (peripheral artery disease) ([place]) 08-14-2019  per pt when he walks (walks 2 miles a day) right foot gets numb but recovers quickly and when he rides his bike left hand gets numb by recovers quickly ;   denies claudication or swelling   per pt s/p   stenting to bilateral lower legs in [place] one in 2003 and the other 2007,  no follow up since moved to [facility]  . Wears glasses    Patient Active Problem List   Diagnosis Date Noted  . Actinic keratosis 05/06/2020  . Nonintractable headache 08/22/2019  . BPH with ED and OAB 05/24/2019  . Essential hypertension 05/24/2019  . Dyslipidemia 05/24/2019  . Anxiety 05/24/2019  . Seborrheic dermatitis 05/24/2019  . Osteoarthritis 07/13/2012   Past Surgical History:  Procedure Laterality Date  . COLOSTOMY REVERSAL  02-21-2017     [place], [place] (OR record scanned in epic)   w/ sigmoid and descending colectomy with appendectomy  . CYSTOSCOPY WITH INJECTION N/A 12/19/2019   Procedure: CYSTOSCOPY WITH INJECTION;  Surgeon: Alexis Frock, MD;  Location: [facility];  Service: Urology;  Laterality: N/A;  . CYSTOSCOPY WITH URETHRAL DILATATION N/A 12/19/2019   Procedure: CYSTOSCOPY WITH URETHRAL DILATATION;  Surgeon: Alexis Frock, MD;  Location[facility];  Service: Urology;  Laterality: N/A;  48 MINS  . ENDOVASCULAR STENT INSERTION  2003 and 2007  --- both done in [place]   bilateral legs  . PARTIAL COLECTOMY  09-04-2016  in [place], [place]   w/  colostomy for perferated divierticulitis  . PARTIAL KNEE ARTHROPLASTY Left 2017  . TOTAL KNEE ARTHROPLASTY Right 2012  . TRANSURETHRAL RESECTION OF PROSTATE N/A 08/15/2019   Procedure: TRANSURETHRAL RESECTION OF THE PROSTATE (TURP);  Surgeon: Alexis Frock, MD;  Location: [facility];  Service: Urology;  Laterality: N/A;    Family History  Problem Relation Age of Onset  . Alzheimer's disease Mother   . Lung cancer Father        non smoker    Medications- reviewed and updated Current Outpatient Medications  Medication Sig Dispense Refill  . amLODipine (NORVASC) 10 MG tablet Take 1 tablet (10 mg total) by mouth daily. 90 tablet 3  . aspirin EC 81 MG tablet Take 81 mg by mouth daily.    Marland Kitchen atorvastatin  (LIPITOR) 40 MG tablet Take 1 tablet (40 mg total) by mouth daily. 90 tablet 3  . doxepin (SINEQUAN) 25 MG capsule Take 1 capsule (25 mg total) by mouth daily. 90 capsule 3  . finasteride (PROSCAR) 5 MG tablet Take 1 tablet (5 mg total) by mouth at bedtime. 90 tablet 3  . Naphazoline-Pheniramine (ALLERGY EYE OP) Apply to eye as needed.    . tadalafil (CIALIS) 20 MG tablet Take 1 tablet (20 mg total) by mouth daily as needed for erectile dysfunction. 90 tablet 3   No current facility-administered medications for this visit.    Allergies-reviewed and updated No Known Allergies  Social History   Socioeconomic History  . Marital status: Married    Spouse name: Not on file  . Number of children: Not on file  . Years of education: Not on file  . Highest education level: Not on file  Occupational History  . Not on file  Tobacco Use  . Smoking status: Former Smoker    Years: 20.00  Types: Cigarettes    Quit date: 05/23/1994    Years since quitting: 25.9  . Smokeless tobacco: Never Used  Substance and Sexual Activity  . Alcohol use: Yes    Comment: occasionally  . Drug use: Never  . Sexual activity: Yes  Other Topics Concern  . Not on file  Social History Narrative   Right handed   Lives in two story home with wife   Social Determinants of Health   Financial Resource Strain:   . Difficulty of Paying Living Expenses:   Food Insecurity:   . Worried About Charity fundraiser in the Last Year:   . Arboriculturist in the Last Year:   Transportation Needs:   . Film/video editor (Medical):   Marland Kitchen Lack of Transportation (Non-Medical):   Physical Activity: Sufficiently Active  . Days of Exercise per Week: 4 days  . Minutes of Exercise per Session: 40 min  Stress:   . Feeling of Stress :   Social Connections:   . Frequency of Communication with Friends and Family:   . Frequency of Social Gatherings with Friends and Family:   . Attends Religious Services:   . Active Member of  Clubs or Organizations:   . Attends Archivist Meetings:   Marland Kitchen Marital Status:         Objective:  Physical Exam: BP 118/70 (BP Location: Left Arm, Patient Position: Sitting, Cuff Size: Normal)   Pulse 78   Temp 97.6 F (36.4 C) (Temporal)   Ht _0  (1.778 m)   Wt 188 lb 3.2 oz (85.4 kg)   SpO2 98%   BMI 27.00 kg/m   Body mass index is 27 kg/m. Wt Readings from Last 3 Encounters:  05/06/20 188 lb 3.2 oz (85.4 kg)  03/11/20 192 lb 3.2 oz (87.2 kg)  12/19/19 193 lb 11.2 oz (87.9 kg)   Gen: NAD, resting comfortably HEENT: TMs normal bilaterally. OP clear. No thyromegaly noted.  CV: RRR with soft 1/6 systolic murmurs appreciated Pulm: NWOB, CTAB with no crackles, wheezes, or rhonchi GI: Normal bowel sounds present. Soft, Nontender, Nondistended. MSK: no edema, cyanosis, or clubbing noted Skin: warm, dry.  Several scattered actinic keratoses noted on upper extremities and back.  Pigme Neuro: CN2-12 grossly intact. Strength 5/5 in upper and lower extremities. Reflexes symmetric and intact bilaterally.  Psych: Normal affect and thought content  Cryotherapy Procedure Note  Pre-operative Diagnosis: Actinic keratosis  Locations: Torso and bilateral arms  Indications: Therapeutic  Procedure Details  Patient informed of risks (permanent scarring, infection, light or dark discoloration, bleeding, infection, weakness, numbness and recurrence of the lesion) and benefits of the procedure and verbal informed consent obtained.  The areas are treated with liquid nitrogen therapy, frozen until ice ball extended 3 mm beyond lesion, allowed to thaw, and treated again.  A total of 5 lesions were treated the patient tolerated procedure well.  The patient was instructed on post-op care, warned that there may be blister formation, redness and pain. Recommend OTC analgesia as needed for pain.  Condition: Stable  Complications: none.   Punch Biopsy Procedure Note After informed  written consent was obtained, using Betadine for cleansing and 1% Lidocaine with epinephrine for anesthetic, with sterile technique a 3 mm punch biopsy was used to obtain a biopsy specimen of the lesion. Hemostasis was obtained by pressure and wound was note sutured. Antibiotic dressing is applied, and wound care instructions provided. Be alert for any signs of cutaneous infection. The  specimen is labeled and sent to pathology for evaluation. The procedure was well tolerated without complications.      Algis Greenhouse. Jerline Pain, MD 05/06/2020 10:33 AM

## 2020-05-06 NOTE — Assessment & Plan Note (Signed)
Stable.  Will refill finasteride 5 mg daily and Cialis 20 mg daily as needed.

## 2020-05-06 NOTE — Patient Instructions (Signed)
It was very nice to see you today!  We froze a few spots and took a biopsy today.  This should come back in a couple of days.  We will check blood work today.  I will refill your medications.  Come back in 1 year for your next checkup, or sooner if needed.  Take care, Dr Jerline Pain  Please try these tips to maintain a healthy lifestyle:   Eat at least 3 REAL meals and 1-2 snacks per day.  Aim for no more than 5 hours between eating.  If you eat breakfast, please do so within one hour of getting up.    Each meal should contain half fruits/vegetables, one quarter protein, and one quarter carbs (no bigger than a computer mouse)   Cut down on sweet beverages. This includes juice, soda, and sweet tea.     Drink at least 1 glass of water with each meal and aim for at least 8 glasses per day   Exercise at least 150 minutes every week.    Preventive Care 70 Years and Older, Male Preventive care refers to lifestyle choices and visits with your health care provider that can promote health and wellness. This includes:  A yearly physical exam. This is also called an annual well check.  Regular dental and eye exams.  Immunizations.  Screening for certain conditions.  Healthy lifestyle choices, such as diet and exercise. What can I expect for my preventive care visit? Physical exam Your health care provider will check:  Height and weight. These may be used to calculate body mass index (BMI), which is a measurement that tells if you are at a healthy weight.  Heart rate and blood pressure.  Your skin for abnormal spots. Counseling Your health care provider may ask you questions about:  Alcohol, tobacco, and drug use.  Emotional well-being.  Home and relationship well-being.  Sexual activity.  Eating habits.  History of falls.  Memory and ability to understand (cognition).  Work and work Statistician. What immunizations do I need?  Influenza (flu) vaccine  This is  recommended every year. Tetanus, diphtheria, and pertussis (Tdap) vaccine  You may need a Td booster every 10 years. Varicella (chickenpox) vaccine  You may need this vaccine if you have not already been vaccinated. Zoster (shingles) vaccine  You may need this after age 74. Pneumococcal conjugate (PCV13) vaccine  One dose is recommended after age 54. Pneumococcal polysaccharide (PPSV23) vaccine  One dose is recommended after age 64. Measles, mumps, and rubella (MMR) vaccine  You may need at least one dose of MMR if you were born in 1957 or later. You may also need a second dose. Meningococcal conjugate (MenACWY) vaccine  You may need this if you have certain conditions. Hepatitis A vaccine  You may need this if you have certain conditions or if you travel or work in places where you may be exposed to hepatitis A. Hepatitis B vaccine  You may need this if you have certain conditions or if you travel or work in places where you may be exposed to hepatitis B. Haemophilus influenzae type b (Hib) vaccine  You may need this if you have certain conditions. You may receive vaccines as individual doses or as more than one vaccine together in one shot (combination vaccines). Talk with your health care provider about the risks and benefits of combination vaccines. What tests do I need? Blood tests  Lipid and cholesterol levels. These may be checked every 5 years, or more  frequently depending on your overall health.  Hepatitis C test.  Hepatitis B test. Screening  Lung cancer screening. You may have this screening every year starting at age 55 if you have a 30-pack-year history of smoking and currently smoke or have quit within the past 15 years.  Colorectal cancer screening. All adults should have this screening starting at age 34 and continuing until age 50. Your health care provider may recommend screening at age 32 if you are at increased risk. You will have tests every 1-10  years, depending on your results and the type of screening test.  Prostate cancer screening. Recommendations will vary depending on your family history and other risks.  Diabetes screening. This is done by checking your blood sugar (glucose) after you have not eaten for a while (fasting). You may have this done every 1-3 years.  Abdominal aortic aneurysm (AAA) screening. You may need this if you are a current or former smoker.  Sexually transmitted disease (STD) testing. Follow these instructions at home: Eating and drinking  Eat a diet that includes fresh fruits and vegetables, whole grains, lean protein, and low-fat dairy products. Limit your intake of foods with high amounts of sugar, saturated fats, and salt.  Take vitamin and mineral supplements as recommended by your health care provider.  Do not drink alcohol if your health care provider tells you not to drink.  If you drink alcohol: ? Limit how much you have to 0-2 drinks a day. ? Be aware of how much alcohol is in your drink. In the [place] one drink equals one 12 oz bottle of beer (355 mL), one 5 oz glass of wine (148 mL), or one 1 oz glass of hard liquor (44 mL). Lifestyle  Take daily care of your teeth and gums.  Stay active. Exercise for at least 30 minutes on 5 or more days each week.  Do not use any products that contain nicotine or tobacco, such as cigarettes, e-cigarettes, and chewing tobacco. If you need help quitting, ask your health care provider.  If you are sexually active, practice safe sex. Use a condom or other form of protection to prevent STIs (sexually transmitted infections).  Talk with your health care provider about taking a low-dose aspirin or statin. What's next?  Visit your health care provider once a year for a well check visit.  Ask your health care provider how often you should have your eyes and teeth checked.  Stay up to date on all vaccines. This information is not intended to replace  advice given to you by your health care provider. Make sure you discuss any questions you have with your health care provider. Document Revised: 11/30/2018 Document Reviewed: 11/30/2018 Elsevier Patient Education  [place].   Skin Biopsy, Care After This sheet gives you information about how to care for yourself after your procedure. Your health care provider may also give you more specific instructions. If you have problems or questions, contact your health care provider. What can I expect after the procedure? After the procedure, it is common to have:  Soreness.  Bruising.  Itching. Follow these instructions at home: Biopsy site care Follow instructions from your health care provider about how to take care of your biopsy site. Make sure you:  Wash your hands with soap and water before and after you change your bandage (dressing). If soap and water are not available, use hand sanitizer.  Apply ointment on your biopsy site as directed by your health care  provider.  Change your dressing as told by your health care provider.  Leave stitches (sutures), skin glue, or adhesive strips in place. These skin closures may need to stay in place for 2 weeks or longer. If adhesive strip edges start to loosen and curl up, you may trim the loose edges. Do not remove adhesive strips completely unless your health care provider tells you to do that.  If the biopsy area bleeds, apply gentle pressure for 10 minutes. Check your biopsy site every day for signs of infection. Check for:  Redness, swelling, or pain.  Fluid or blood.  Warmth.  Pus or a bad smell.  General instructions  Rest and then return to your normal activities as told by your health care provider.  Take over-the-counter and prescription medicines only as told by your health care provider.  Keep all follow-up visits as told by your health care provider. This is important. Contact a health care provider if:  You  have redness, swelling, or pain around your biopsy site.  You have fluid or blood coming from your biopsy site.  Your biopsy site feels warm to the touch.  You have pus or a bad smell coming from your biopsy site.  You have a fever.  Your sutures, skin glue, or adhesive strips loosen or come off sooner than expected. Get help right away if:  You have bleeding that does not stop with pressure or a dressing. Summary  After the procedure, it is common to have soreness, bruising, and itching at the site.  Follow instructions from your health care provider about how to take care of your biopsy site.  Check your biopsy site every day for signs of infection.  Contact a health care provider if you have redness, swelling, or pain around your biopsy site, or your biopsy site feels warm to the touch.  Keep all follow-up visits as told by your health care provider. This is important. This information is not intended to replace advice given to you by your health care provider. Make sure you discuss any questions you have with your health care provider. Document Revised: 06/05/2018 Document Reviewed: 06/05/2018 Elsevier Patient Education  [place].

## 2020-05-06 NOTE — Assessment & Plan Note (Signed)
Stable.  Continue amlodipine 10 mg daily.  Check CBC, C met, TSH.

## 2020-05-09 NOTE — Progress Notes (Signed)
Please inform patient of the following:  Biopsy reported showed a benign lesion - do not need to do any further testing.  All of his blood work is STABLE. Do not need to make any adjustments to his treatment plan at this time. Would like for him to keep up the good work with his diet and exercise and we can recheck in a year or so.  Katina Degree. Jimmey Ralph, MD 05/09/2020 7:58 AM

## 2020-07-02 DIAGNOSIS — R948 Abnormal results of function studies of other organs and systems: Secondary | ICD-10-CM | POA: Diagnosis not present

## 2020-07-02 LAB — PSA: PSA: 0.39

## 2020-07-10 DIAGNOSIS — R3915 Urgency of urination: Secondary | ICD-10-CM | POA: Diagnosis not present

## 2020-07-10 DIAGNOSIS — N5201 Erectile dysfunction due to arterial insufficiency: Secondary | ICD-10-CM | POA: Diagnosis not present

## 2020-07-10 DIAGNOSIS — N35811 Other urethral stricture, male, meatal: Secondary | ICD-10-CM | POA: Diagnosis not present

## 2020-07-10 DIAGNOSIS — Z125 Encounter for screening for malignant neoplasm of prostate: Secondary | ICD-10-CM | POA: Diagnosis not present

## 2020-07-25 ENCOUNTER — Telehealth: Payer: Self-pay | Admitting: Family Medicine

## 2020-07-25 NOTE — Progress Notes (Signed)
°  Chronic Care Management   Note  07/25/2020 Name: Jim Mathis. MRN: [id number] DOB: 1951/01/18  Jim Mathis. is a 69 y.o. year old male who is a primary care patient of Ardith Dark, MD. I reached out to [name]. by phone today in response to a referral sent by [name]'s PCP, Ardith Dark, MD.   Jim Mathis was given information about Chronic Care Management services today including:  1. CCM service includes personalized support from designated clinical staff supervised by his physician, including individualized plan of care and coordination with other care providers 2. 24/7 contact phone numbers for assistance for urgent and routine care needs. 3. Service will only be billed when office clinical staff spend 20 minutes or more in a month to coordinate care. 4. Only one practitioner may furnish and bill the service in a calendar month. 5. The patient may stop CCM services at any time (effective at the end of the month) by phone call to the office staff.   Patient agreed to services and verbal consent obtained.   Follow up plan:   Lynnae January [employer] Scheduler

## 2020-08-31 IMAGING — MR MRI CERVICAL SPINE WITHOUT CONTRAST
5 series · 29 of 48 positions shown · non-contrast
Comparison: None.

CLINICAL DATA: Left arm and right leg numbness for 1 - 2 years.

EXAM:
MRI CERVICAL SPINE WITHOUT CONTRAST
TECHNIQUE: Multiplanar, multisequence MR imaging of the cervical spine was
performed. No intravenous contrast was administered.

[Series 2: T2 · sagittal · 3.0mm · 0.41mm/px · 7 of 13 slices shown (1 of 2)]
[im 1/13]
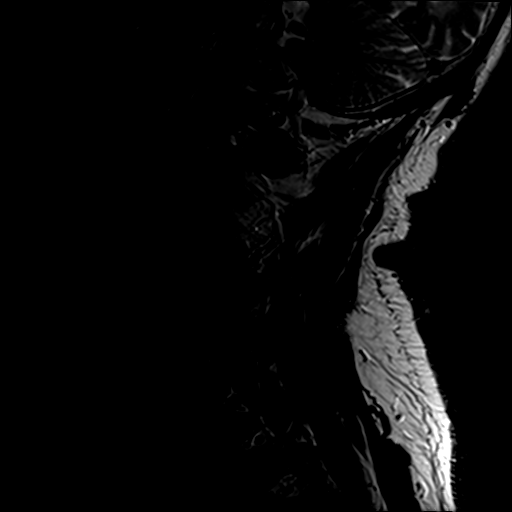
[im 3/13]
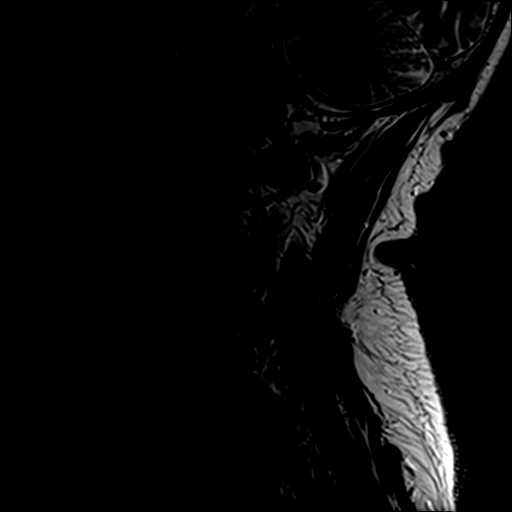
[im 5/13]
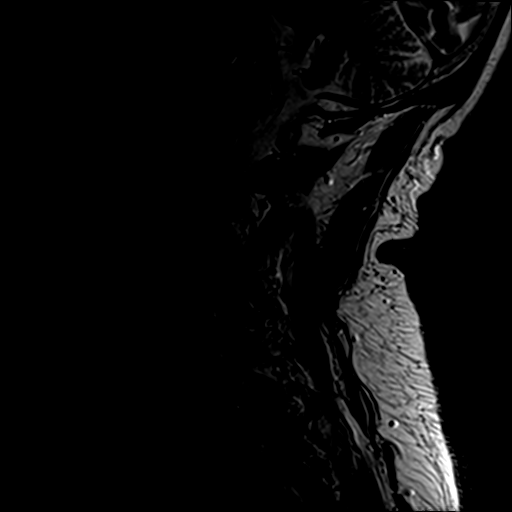
[im 7/13]
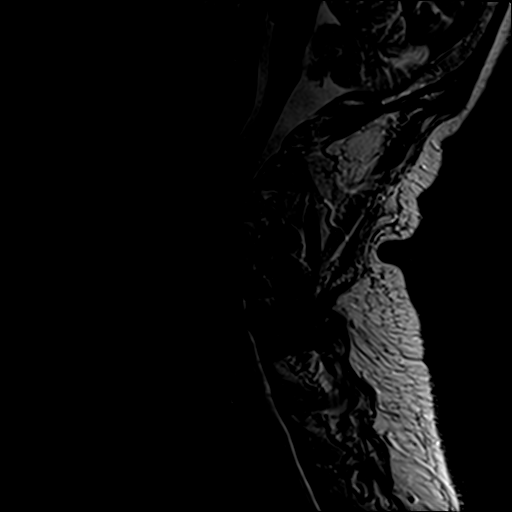
[im 9/13]
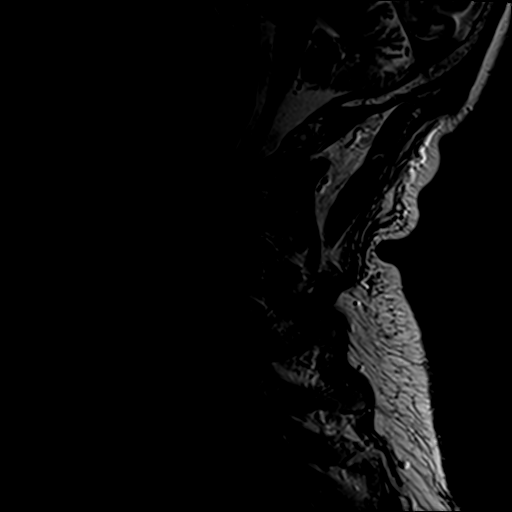
[im 11/13]
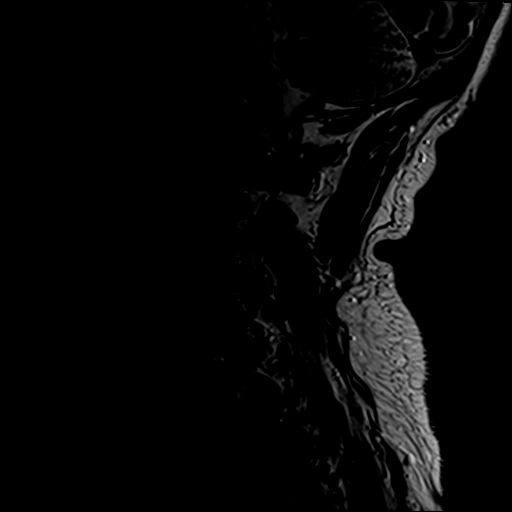
[im 13/13]
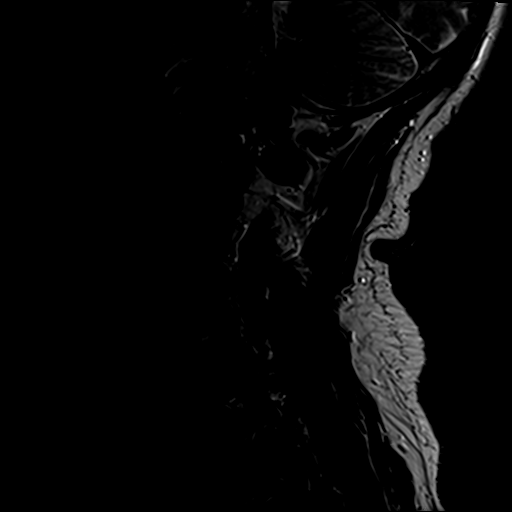

[Series 3: T1 · sagittal · 3.0mm · 0.41mm/px · 6 of 13 slices shown]
[im 1/13]
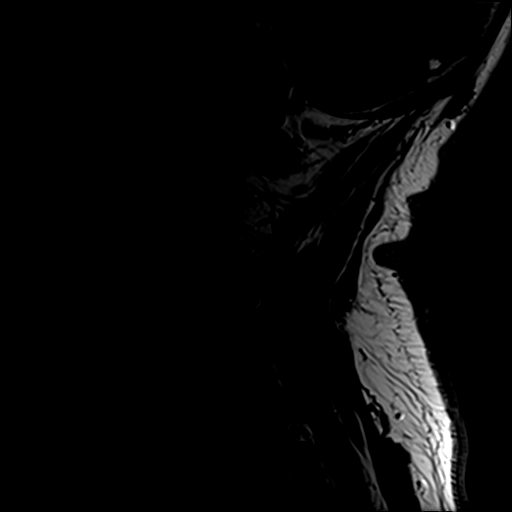
[im 3/13]
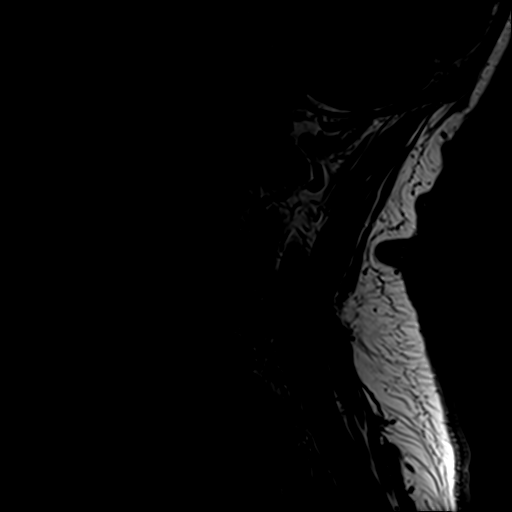
[im 5/13]
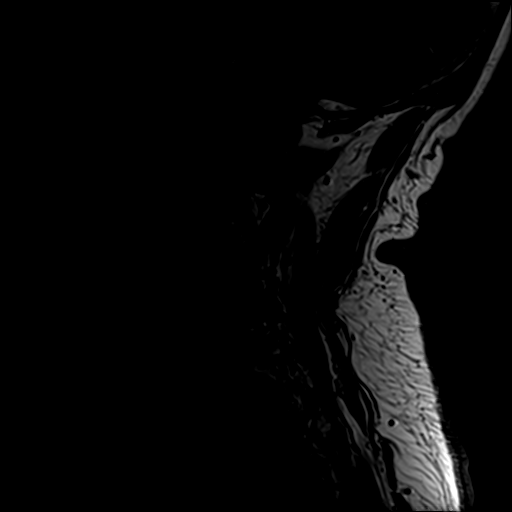
[im 8/13]
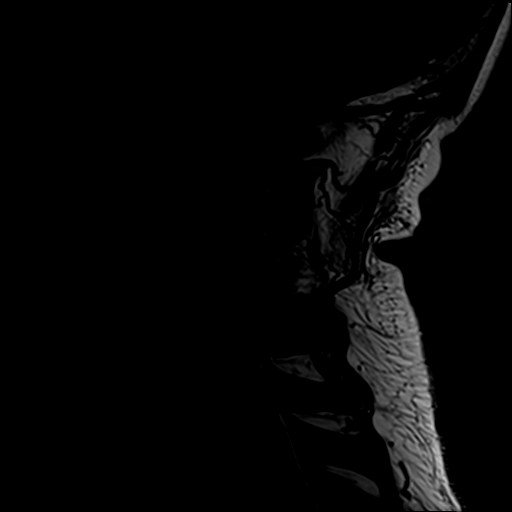
[im 10/13]
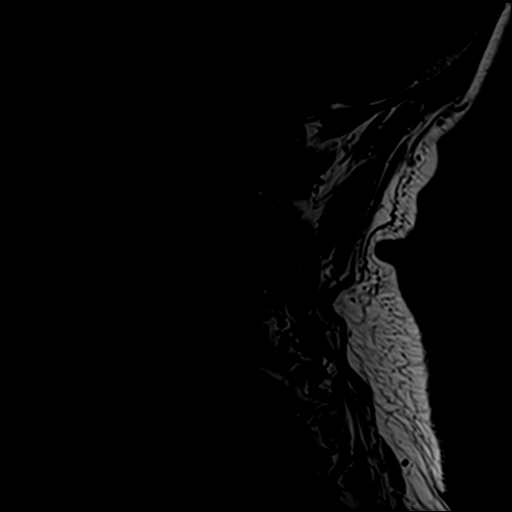
[im 13/13]
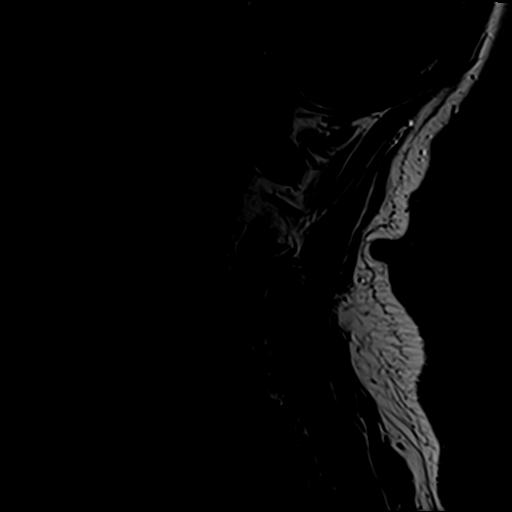

[Series 4: STIR · sagittal · 3.0mm · 0.82mm/px · 6 of 13 slices shown]
[im 1/13]
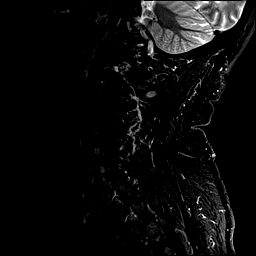
[im 3/13]
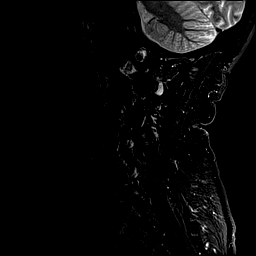
[im 5/13]
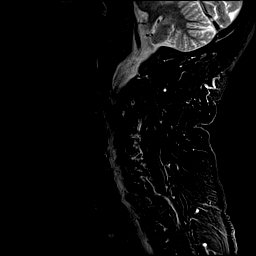
[im 8/13]
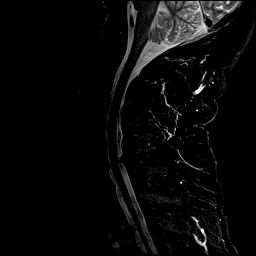
[im 10/13]
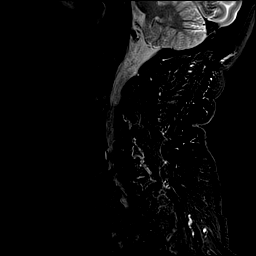
[im 13/13]
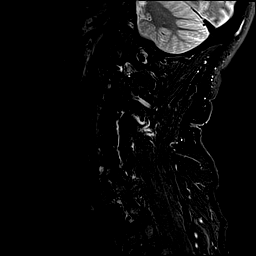

[Series 5: GRE · axial · 3.0mm · 0.35mm/px · z∈[-77,-62]mm · 2 of 30 slices shown]
[im 1/30]
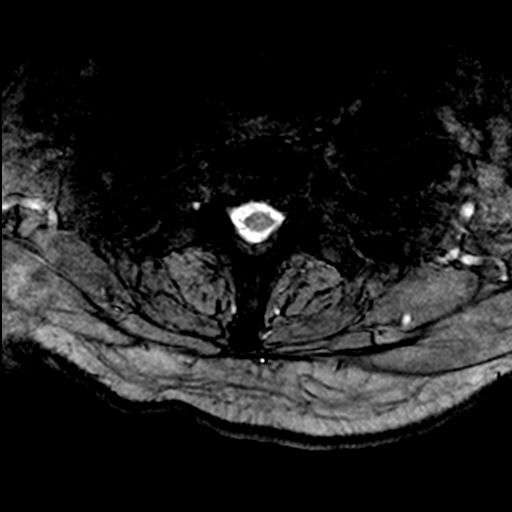
[im 5/30]
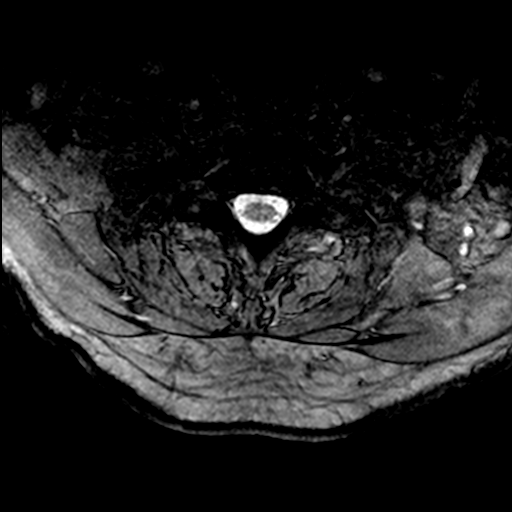

[Series 6: T2 · axial · 3.0mm · 0.70mm/px · z∈[-77,+29]mm · 8 of 29 slices shown (2 of 2)]
[im 1/29]
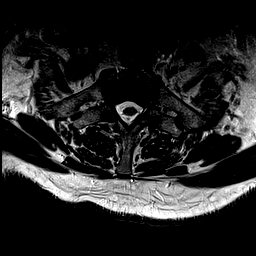
[im 5/29]
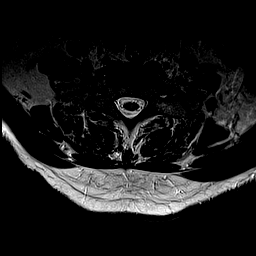
[im 9/29]
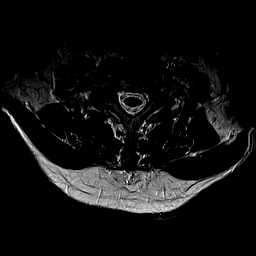
[im 13/29]
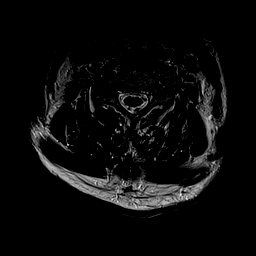
[im 16/29]
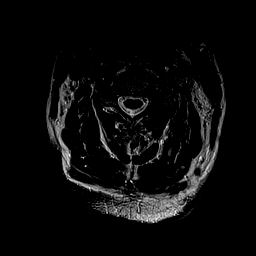
[im 20/29]
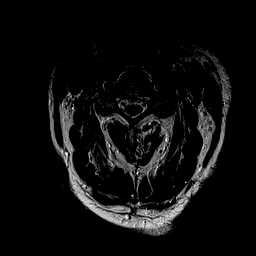
[im 24/29]
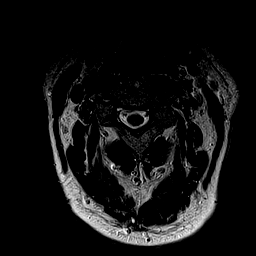
[im 29/29]
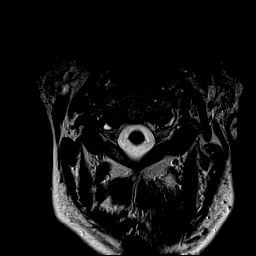

[29 of 48 positions shown; findings below may reference images not displayed]

FINDINGS: Alignment: Slight retrolisthesis at C3-4 and anterolisthesis at C4-5

Vertebrae: No fracture, evidence of discitis, or bone lesion. Mild
discogenic endplate signal at C3-4 and T2-3.

Cord: Normal signal and morphology.

Posterior Fossa, vertebral arteries, paraspinal tissues: Negative.

Disc levels:

C2-3: Facet and intervertebral ankylosis.  No impingement

C3-4: Disc narrowing with endplate and uncovertebral ridging.
Advanced biforaminal impingement

C4-5: Disc narrowing and uncovertebral ridging. Degenerative facet
spurring asymmetric to the left. Biforaminal impingement with patent
spinal canal

C5-6: Disc narrowing with endplate and uncovertebral ridging.
Biforaminal impingement with patent spinal canal

C6-7: Disc narrowing with endplate and uncovertebral ridging. Right
more than left foraminal impingement with patent spinal canal. Based
on gradient imaging this is the least affected of the stenotic
foramina.

C7-T1:Disc narrowing and bulging with ridging. Negative facets. Left
more than right foraminal impingement. Patent spinal canal
IMPRESSION: 1. Bilateral degenerative foraminal impingement from C3-4 to C7-T1.
2. Diffusely patent canal.
3. C2-3 ankylosis.

## 2020-09-03 ENCOUNTER — Ambulatory Visit: Payer: PPO

## 2020-09-03 ENCOUNTER — Other Ambulatory Visit: Payer: Self-pay

## 2020-09-03 DIAGNOSIS — E785 Hyperlipidemia, unspecified: Secondary | ICD-10-CM

## 2020-09-03 DIAGNOSIS — N400 Enlarged prostate without lower urinary tract symptoms: Secondary | ICD-10-CM

## 2020-09-03 DIAGNOSIS — I10 Essential (primary) hypertension: Secondary | ICD-10-CM

## 2020-09-03 NOTE — Progress Notes (Signed)
Chronic Care Management Pharmacy  Name: Jim Mathis.  MRN: [id number] DOB: 05/17/1951  Chief Complaint/ HPI  Jim Mathis.,  69 y.o., male presents for their Initial CCM visit with the clinical pharmacist via telephone due to COVID-19 Pandemic.  PCP : Vivi Barrack, MD  Chronic conditions include:  Encounter Diagnoses  Name Primary?  . Essential hypertension Yes  . Dyslipidemia   . Benign prostatic hyperplasia, unspecified whether lower urinary tract symptoms present     Office Visits:  05/06/2020 (PCP): biopsy showed benign lesion  Patient Active Problem List   Diagnosis Date Noted  . Actinic keratosis 05/06/2020  . Nonintractable headache 08/22/2019  . BPH with ED and OAB 05/24/2019  . Essential hypertension 05/24/2019  . Dyslipidemia 05/24/2019  . Anxiety 05/24/2019  . Seborrheic dermatitis 05/24/2019  . Osteoarthritis 07/13/2012   Past Surgical History:  Procedure Laterality Date  . COLOSTOMY REVERSAL  02-21-2017     [place], [place] (OR record scanned in epic)   w/ sigmoid and descending colectomy with appendectomy  . CYSTOSCOPY WITH INJECTION N/A 12/19/2019   Procedure: CYSTOSCOPY WITH INJECTION;  Surgeon: Alexis Frock, MD;  [facility]: [facility];  Service: Urology;  Laterality: N/A;  . CYSTOSCOPY WITH URETHRAL DILATATION N/A 12/19/2019   Procedure: CYSTOSCOPY WITH URETHRAL DILATATION;  Surgeon: Alexis Frock, MD;  Location: [facility];  Service: Urology;  Laterality: N/A;  61 MINS  . ENDOVASCULAR STENT INSERTION  2003 and 2007  --- both done in [place]   bilateral legs  . PARTIAL COLECTOMY  09-04-2016  in [place], [place]   w/  colostomy for perferated divierticulitis  . PARTIAL KNEE ARTHROPLASTY Left 2017  . TOTAL KNEE ARTHROPLASTY Right 2012  . TRANSURETHRAL RESECTION OF PROSTATE N/A 08/15/2019   Procedure: TRANSURETHRAL RESECTION OF THE PROSTATE (TURP);  Surgeon: Alexis Frock, MD;  Location: [facility];   Service: Urology;  Laterality: N/A;   Family History  Problem Relation Age of Onset  . Alzheimer's disease Mother   . Lung cancer Father        non smoker   No Known Allergies Outpatient Encounter Medications as of 09/03/2020  Medication Sig  . amLODipine (NORVASC) 10 MG tablet Take 1 tablet (10 mg total) by mouth daily.  Marland Kitchen aspirin EC 81 MG tablet Take 81 mg by mouth daily.  Marland Kitchen atorvastatin (LIPITOR) 40 MG tablet Take 1 tablet (40 mg total) by mouth daily.  Marland Kitchen doxepin (SINEQUAN) 25 MG capsule Take 1 capsule (25 mg total) by mouth daily.  . finasteride (PROSCAR) 5 MG tablet Take 1 tablet (5 mg total) by mouth at bedtime.  Mable Fill (ALLERGY EYE OP) Apply to eye as needed.  . tadalafil (CIALIS) 20 MG tablet Take 1 tablet (20 mg total) by mouth daily as needed for erectile dysfunction.   No facility-administered encounter medications on file as of 09/03/2020.   Patient Care Team    Relationship Specialty Notifications Start End  Vivi Barrack, MD PCP - General Family Medicine  05/24/19   Pieter Partridge, DO Consulting Physician Neurology  06/06/19   Alexis Frock, MD Consulting Physician Urology  03/11/20   Madelin Rear, [facility] Pharmacist Pharmacist  07/25/20    Comment: [phone]   Current Diagnosis/Assessment: Goals Addressed            This Visit's Progress   . PharmD Care Plan       CARE PLAN ENTRY (see longitudinal plan of care for additional care plan information)  Current Barriers:  . Chronic Disease Management support, education, and care coordination needs related to Hypertension, Hyperlipidemia, and BPH   Hypertension BP Readings from Last 3 Encounters:  05/06/20 118/70  03/11/20 136/74  12/19/19 139/75   . Pharmacist Clinical Goal(s): o Over the next 180 days, patient will work with PharmD and providers to maintain BP goal <130/80 . Current regimen:  o Amlodipine 10 mg once daily . Interventions: o Diet/exercise recommendations . Patient self  care activities - Over the next 180 days, patient will: o Check BP at least once every 1-2 weeks, document, and provide at future appointments o Ensure daily salt intake < 2300 mg/day  Hyperlipidemia Lab Results  Component Value Date/Time   LDLCALC 102 (H) 05/06/2020 10:27 AM   . Pharmacist Clinical Goal(s): o Over the next 180 days, patient will work with PharmD and providers to achieve LDL goal < 100 . Current regimen:  o Atorvastatin 40 mg once daily . Interventions: o Diet/exercise recommendations . Patient self care activities - Over the next 180 days, patient will: o Continue current management  BPH/ED . Pharmacist Clinical Goal(s) o Over the next 180 days, patient will work with PharmD and providers to minimize symptoms of BPH/ED . Current regimen:  o Finasteride 5 mg once daily at bedtime o Tadalafil 20 mg once daily as needed . Interventions: o Continue current management . Patient self care activities - Over the next 180 days, patient will: o Continue current management  Medication management . Pharmacist Clinical Goal(s): o Over the next 180 days, patient will work with PharmD and providers to maintain optimal medication adherence . Current pharmacy: [facility] . Interventions o Comprehensive medication review performed. o Continue current medication management strategy . Patient self care activities - Over the next 180 days, patient will: o Take medications as prescribed o Report any questions or concerns to PharmD and/or provider(s) Initial goal documentation.      Hypertension   BP goal <130/80  BP Readings from Last 3 Encounters:  05/06/20 118/70  03/11/20 136/74  12/19/19 139/75   Patient has failed these meds in the past: n/a. Patient checks BP at home infrequently Patient home BP readings are ranging: n/a  Patient is currently controlled on the following medications:  . Amlodipine 10 mg once daily   We discussed diet and exercise  extensively. Removed salt from diet, is hiking or biking 3-4 times per week. Hikes 4-5 hrs each time.   Plan  Continue current medications and control with diet and exercise.   Hyperlipidemia   LDL goal < 100  Lipid Panel     Component Value Date/Time   CHOL 170 05/06/2020 1027   TRIG 86.0 05/06/2020 1027   HDL 50.70 05/06/2020 1027   LDLCALC 102 (H) 05/06/2020 1027    Hepatic Function Latest Ref Rng & Units 05/06/2020  Total Protein 6.0 - 8.3 g/dL 7.1  Albumin 3.5 - 5.2 g/dL 4.4  AST 0 - 37 U/L 18  ALT 0 - 53 U/L 17  Alk Phosphatase 39 - 117 U/L 108  Total Bilirubin 0.2 - 1.2 mg/dL 0.4    The 10-year ASCVD risk score Mikey Bussing DC Jr., et al., 2013) is: 15.6%   Values used to calculate the score:     Age: 62 years     Sex: Male     Is Non-Hispanic African American: No     Diabetic: No     Tobacco smoker: No     Systolic Blood Pressure:  118 mmHg     Is BP treated: Yes     HDL Cholesterol: 50.7 mg/dL     Total Cholesterol: 170 mg/dL   Patient has failed these meds in past: n/a Denies any side effects.  Patient is currently above goal on the following medications:  . Atorvastatin 40 mg once daily   We discussed:  diet and exercise extensively.  Plan  Continue current medications and control with diet and exercise.  ED/BPH   Patient has failed these meds in past: n/a Patient is currently taking the following medications:  . Finasteride 5 mg once daily at bedtime . Tadalafil 20 mg once daily as needed  We discussed:  Medication side effects, reviewed accessibility.   Plan  Continue current medications.  Anxiety   Questions about medication related side effects after local pharmacist said he should not be taking doxepin.  Unsuccessful in weaning off previously.  Denies sedation or orthostatic hypertension. Patient is currently controlled on the following medications:  . Doxepin 25 mg once daily for anxiety/sleep  We discussed:  Discussed side effects at  length.   Plan  Continue current medications  Vaccines   Immunization History  Administered Date(s) Administered  . Influenza, High Dose Seasonal PF 08/03/2019  . PFIZER SARS-COV-2 Vaccination 02/02/2020, 02/27/2020  . Pneumococcal Conjugate-13 01/25/2016  . Pneumococcal Polysaccharide-23 01/24/2017  . Tdap 02/08/2018  . Zoster 04/09/2020  . Zoster Recombinat (Shingrix) 07/15/2019   Reviewed and discussed patient's vaccination history. Due for annual flu, covid booster 09/2020.  Plan  Recommended patient receive vaccines listed above.  Medication Management Coordination   Receives prescription medications from:  [facility], [place] [address] Phone: [phone] Fax: [phone]  [facility]) - [place], [place] [address] Phone: [phone] Fax: [phone]   Denies any issues with current medication management.   Plan  Continue current medication management strategy. ___________________________ SDOH (Social Determinants of Health) assessments performed: Yes.  No future appointments. Visit follow-up:  . RPH follow-up: 6 month telephone visit.  Madelin Rear, Pharm.D., BCGP Clinical Pharmacist [facility] [phone]

## 2020-09-03 NOTE — Patient Instructions (Addendum)
Please review care plan below and call me at [phone] (direct line) with any questions!  Thank you, Bard Herbert., Clinical Pharmacist  Goals Addressed            This Visit's Progress   . PharmD Care Plan       CARE PLAN ENTRY (see longitudinal plan of care for additional care plan information)  Current Barriers:  . Chronic Disease Management support, education, and care coordination needs related to Hypertension, Hyperlipidemia, and BPH   Hypertension BP Readings from Last 3 Encounters:  05/06/20 118/70  03/11/20 136/74  12/19/19 139/75   . Pharmacist Clinical Goal(s): o Over the next 180 days, patient will work with PharmD and providers to maintain BP goal <130/80 . Current regimen:  o Amlodipine 10 mg once daily . Interventions: o Diet/exercise recommendations . Patient self care activities - Over the next 180 days, patient will: o Check BP at least once every 1-2 weeks, document, and provide at future appointments o Ensure daily salt intake < 2300 mg/day  Hyperlipidemia Lab Results  Component Value Date/Time   LDLCALC 102 (H) 05/06/2020 10:27 AM   . Pharmacist Clinical Goal(s): o Over the next 180 days, patient will work with PharmD and providers to achieve LDL goal < 100 . Current regimen:  o Atorvastatin 40 mg once daily . Interventions: o Diet/exercise recommendations . Patient self care activities - Over the next 180 days, patient will: o Continue current management  BPH/ED . Pharmacist Clinical Goal(s) o Over the next 180 days, patient will work with PharmD and providers to minimize symptoms of BPH/ED . Current regimen:  o Finasteride 5 mg once daily at bedtime o Tadalafil 20 mg once daily as needed . Interventions: o Continue current management . Patient self care activities - Over the next 180 days, patient will: o Continue current management  Medication management . Pharmacist Clinical Goal(s): o Over the next 180 days, patient will work with  PharmD and providers to maintain optimal medication adherence . Current pharmacy: [facility] . Interventions o Comprehensive medication review performed. o Continue current medication management strategy . Patient self care activities - Over the next 180 days, patient will: o Take medications as prescribed o Report any questions or concerns to PharmD and/or provider(s) Initial goal documentation.      [name] was given information about Chronic Care Management services today including:  1. CCM service includes personalized support from designated clinical staff supervised by his physician, including individualized plan of care and coordination with other care providers 2. 24/7 contact phone numbers for assistance for urgent and routine care needs. 3. Standard insurance, coinsurance, copays and deductibles apply for chronic care management only during months in which we provide at least 20 minutes of these services. Most insurances cover these services at 100%, however patients may be responsible for any copay, coinsurance and/or deductible if applicable. This service may help you avoid the need for more expensive face-to-face services. 4. Only one practitioner may furnish and bill the service in a calendar month. 5. The patient may stop CCM services at any time (effective at the end of the month) by phone call to the office staff.  Patient agreed to services and verbal consent obtained.   The patient verbalized understanding of instructions provided today and agreed to receive a mailed copy of patient instruction and/or educational materials. Telephone follow up appointment with pharmacy team member scheduled for: See next appointment with "Care Management Staff" under "What's Next" below.  [name], Pharm.D., BCGP Clinical Pharmacist [facility] [phone]  High Cholesterol  High cholesterol is a condition in which the blood has high levels of a white, waxy,  fat-like substance (cholesterol). The human body needs small amounts of cholesterol. The liver makes all the cholesterol that the body needs. Extra (excess) cholesterol comes from the food that we eat. Cholesterol is carried from the liver by the blood through the blood vessels. If you have high cholesterol, deposits (plaques) may build up on the walls of your blood vessels (arteries). Plaques make the arteries narrower and stiffer. Cholesterol plaques increase your risk for heart attack and stroke. Work with your health care provider to keep your cholesterol levels in a healthy range. What increases the risk? This condition is more likely to develop in people who:  Eat foods that are high in animal fat (saturated fat) or cholesterol.  Are overweight.  Are not getting enough exercise.  Have a family history of high cholesterol. What are the signs or symptoms? There are no symptoms of this condition. How is this diagnosed? This condition may be diagnosed from the results of a blood test.  If you are older than age 30, your health care provider may check your cholesterol every 4-6 years.  You may be checked more often if you already have high cholesterol or other risk factors for heart disease. The blood test for cholesterol measures:  "Bad" cholesterol (LDL cholesterol). This is the main type of cholesterol that causes heart disease. The desired level for LDL is less than 100.  "Good" cholesterol (HDL cholesterol). This type helps to protect against heart disease by cleaning the arteries and carrying the LDL away. The desired level for HDL is 60 or higher.  Triglycerides. These are fats that the body can store or burn for energy. The desired number for triglycerides is lower than 150.  Total cholesterol. This is a measure of the total amount of cholesterol in your blood, including LDL cholesterol, HDL cholesterol, and triglycerides. A healthy number is less than 200. How is this  treated? This condition is treated with diet changes, lifestyle changes, and medicines. Diet changes  This may include eating more whole grains, fruits, vegetables, nuts, and fish.  This may also include cutting back on red meat and foods that have a lot of added sugar. Lifestyle changes  Changes may include getting at least 40 minutes of aerobic exercise 3 times a week. Aerobic exercises include walking, biking, and swimming. Aerobic exercise along with a healthy diet can help you maintain a healthy weight.  Changes may also include quitting smoking. Medicines  Medicines are usually given if diet and lifestyle changes have failed to reduce your cholesterol to healthy levels.  Your health care provider may prescribe a statin medicine. Statin medicines have been shown to reduce cholesterol, which can reduce the risk of heart disease. Follow these instructions at home: Eating and drinking If told by your health care provider:  Eat chicken (without skin), fish, veal, shellfish, ground [place] breast, and round or loin cuts of red meat.  Do not eat fried foods or fatty meats, such as hot dogs and salami.  Eat plenty of fruits, such as apples.  Eat plenty of vegetables, such as broccoli, potatoes, and carrots.  Eat beans, peas, and lentils.  Eat grains such as barley, rice, couscous, and bulgur wheat.  Eat pasta without cream sauces.  Use skim or nonfat milk, and eat low-fat or nonfat yogurt and cheeses.  Do not eat or drink whole milk, cream, ice cream, egg yolks, or hard cheeses.  Do not eat stick margarine or tub margarines that contain trans fats (also called partially hydrogenated oils).  Do not eat saturated tropical oils, such as coconut oil and palm oil.  Do not eat cakes, cookies, crackers, or other baked goods that contain trans fats.  General instructions  Exercise as directed by your health care provider. Increase your activity level with activities such as  gardening, walking, and taking the stairs.  Take over-the-counter and prescription medicines only as told by your health care provider.  Do not use any products that contain nicotine or tobacco, such as cigarettes and e-cigarettes. If you need help quitting, ask your health care provider.  Keep all follow-up visits as told by your health care provider. This is important. Contact a health care provider if:  You are struggling to maintain a healthy diet or weight.  You need help to start on an exercise program.  You need help to stop smoking. Get help right away if:  You have chest pain.  You have trouble breathing. This information is not intended to replace advice given to you by your health care provider. Make sure you discuss any questions you have with your health care provider. Document Revised: 12/09/2017 Document Reviewed: 06/05/2016 Elsevier Patient Education  2020 [employer].

## 2020-09-05 ENCOUNTER — Other Ambulatory Visit: Payer: Self-pay | Admitting: *Deleted

## 2020-09-05 DIAGNOSIS — F0391 Unspecified dementia with behavioral disturbance: Secondary | ICD-10-CM

## 2020-10-02 DIAGNOSIS — M9904 Segmental and somatic dysfunction of sacral region: Secondary | ICD-10-CM | POA: Diagnosis not present

## 2020-10-02 DIAGNOSIS — M9902 Segmental and somatic dysfunction of thoracic region: Secondary | ICD-10-CM | POA: Diagnosis not present

## 2020-10-02 DIAGNOSIS — M9901 Segmental and somatic dysfunction of cervical region: Secondary | ICD-10-CM | POA: Diagnosis not present

## 2020-10-02 DIAGNOSIS — M9903 Segmental and somatic dysfunction of lumbar region: Secondary | ICD-10-CM | POA: Diagnosis not present

## 2020-10-03 DIAGNOSIS — M9902 Segmental and somatic dysfunction of thoracic region: Secondary | ICD-10-CM | POA: Diagnosis not present

## 2020-10-03 DIAGNOSIS — M9901 Segmental and somatic dysfunction of cervical region: Secondary | ICD-10-CM | POA: Diagnosis not present

## 2020-10-03 DIAGNOSIS — M9903 Segmental and somatic dysfunction of lumbar region: Secondary | ICD-10-CM | POA: Diagnosis not present

## 2020-10-03 DIAGNOSIS — M9904 Segmental and somatic dysfunction of sacral region: Secondary | ICD-10-CM | POA: Diagnosis not present

## 2020-10-06 DIAGNOSIS — M9904 Segmental and somatic dysfunction of sacral region: Secondary | ICD-10-CM | POA: Diagnosis not present

## 2020-10-06 DIAGNOSIS — M9903 Segmental and somatic dysfunction of lumbar region: Secondary | ICD-10-CM | POA: Diagnosis not present

## 2020-10-06 DIAGNOSIS — M9901 Segmental and somatic dysfunction of cervical region: Secondary | ICD-10-CM | POA: Diagnosis not present

## 2020-10-06 DIAGNOSIS — M9902 Segmental and somatic dysfunction of thoracic region: Secondary | ICD-10-CM | POA: Diagnosis not present

## 2020-10-08 DIAGNOSIS — M9902 Segmental and somatic dysfunction of thoracic region: Secondary | ICD-10-CM | POA: Diagnosis not present

## 2020-10-08 DIAGNOSIS — M9904 Segmental and somatic dysfunction of sacral region: Secondary | ICD-10-CM | POA: Diagnosis not present

## 2020-10-08 DIAGNOSIS — M9901 Segmental and somatic dysfunction of cervical region: Secondary | ICD-10-CM | POA: Diagnosis not present

## 2020-10-08 DIAGNOSIS — M9903 Segmental and somatic dysfunction of lumbar region: Secondary | ICD-10-CM | POA: Diagnosis not present

## 2020-10-10 ENCOUNTER — Other Ambulatory Visit: Payer: Self-pay | Admitting: Family Medicine

## 2020-10-10 DIAGNOSIS — M9901 Segmental and somatic dysfunction of cervical region: Secondary | ICD-10-CM | POA: Diagnosis not present

## 2020-10-10 DIAGNOSIS — M9903 Segmental and somatic dysfunction of lumbar region: Secondary | ICD-10-CM | POA: Diagnosis not present

## 2020-10-10 DIAGNOSIS — M9904 Segmental and somatic dysfunction of sacral region: Secondary | ICD-10-CM | POA: Diagnosis not present

## 2020-10-10 DIAGNOSIS — M9902 Segmental and somatic dysfunction of thoracic region: Secondary | ICD-10-CM | POA: Diagnosis not present

## 2020-10-13 ENCOUNTER — Encounter: Payer: Self-pay | Admitting: Family Medicine

## 2020-10-13 ENCOUNTER — Other Ambulatory Visit: Payer: Self-pay

## 2020-10-13 DIAGNOSIS — M9904 Segmental and somatic dysfunction of sacral region: Secondary | ICD-10-CM | POA: Diagnosis not present

## 2020-10-13 DIAGNOSIS — M9901 Segmental and somatic dysfunction of cervical region: Secondary | ICD-10-CM | POA: Diagnosis not present

## 2020-10-13 DIAGNOSIS — M9903 Segmental and somatic dysfunction of lumbar region: Secondary | ICD-10-CM | POA: Diagnosis not present

## 2020-10-13 DIAGNOSIS — M9902 Segmental and somatic dysfunction of thoracic region: Secondary | ICD-10-CM | POA: Diagnosis not present

## 2020-10-13 MED ORDER — TRIAMCINOLONE ACETONIDE 0.5 % EX OINT
1.0000 | TOPICAL_OINTMENT | Freq: Three times a day (TID) | CUTANEOUS | 3 refills | Status: DC
Start: 2020-10-13 — End: 2022-01-04

## 2020-10-15 DIAGNOSIS — M9902 Segmental and somatic dysfunction of thoracic region: Secondary | ICD-10-CM | POA: Diagnosis not present

## 2020-10-15 DIAGNOSIS — M9901 Segmental and somatic dysfunction of cervical region: Secondary | ICD-10-CM | POA: Diagnosis not present

## 2020-10-15 DIAGNOSIS — M9904 Segmental and somatic dysfunction of sacral region: Secondary | ICD-10-CM | POA: Diagnosis not present

## 2020-10-15 DIAGNOSIS — M9903 Segmental and somatic dysfunction of lumbar region: Secondary | ICD-10-CM | POA: Diagnosis not present

## 2020-10-17 DIAGNOSIS — M9903 Segmental and somatic dysfunction of lumbar region: Secondary | ICD-10-CM | POA: Diagnosis not present

## 2020-10-17 DIAGNOSIS — M9901 Segmental and somatic dysfunction of cervical region: Secondary | ICD-10-CM | POA: Diagnosis not present

## 2020-10-17 DIAGNOSIS — M9902 Segmental and somatic dysfunction of thoracic region: Secondary | ICD-10-CM | POA: Diagnosis not present

## 2020-10-17 DIAGNOSIS — M9904 Segmental and somatic dysfunction of sacral region: Secondary | ICD-10-CM | POA: Diagnosis not present

## 2020-10-21 DIAGNOSIS — M9904 Segmental and somatic dysfunction of sacral region: Secondary | ICD-10-CM | POA: Diagnosis not present

## 2020-10-21 DIAGNOSIS — M9901 Segmental and somatic dysfunction of cervical region: Secondary | ICD-10-CM | POA: Diagnosis not present

## 2020-10-21 DIAGNOSIS — M9902 Segmental and somatic dysfunction of thoracic region: Secondary | ICD-10-CM | POA: Diagnosis not present

## 2020-10-21 DIAGNOSIS — M9903 Segmental and somatic dysfunction of lumbar region: Secondary | ICD-10-CM | POA: Diagnosis not present

## 2020-10-22 DIAGNOSIS — M9903 Segmental and somatic dysfunction of lumbar region: Secondary | ICD-10-CM | POA: Diagnosis not present

## 2020-10-22 DIAGNOSIS — M9902 Segmental and somatic dysfunction of thoracic region: Secondary | ICD-10-CM | POA: Diagnosis not present

## 2020-10-22 DIAGNOSIS — M9904 Segmental and somatic dysfunction of sacral region: Secondary | ICD-10-CM | POA: Diagnosis not present

## 2020-10-22 DIAGNOSIS — M9901 Segmental and somatic dysfunction of cervical region: Secondary | ICD-10-CM | POA: Diagnosis not present

## 2020-10-24 DIAGNOSIS — M9904 Segmental and somatic dysfunction of sacral region: Secondary | ICD-10-CM | POA: Diagnosis not present

## 2020-10-24 DIAGNOSIS — M9903 Segmental and somatic dysfunction of lumbar region: Secondary | ICD-10-CM | POA: Diagnosis not present

## 2020-10-24 DIAGNOSIS — M9901 Segmental and somatic dysfunction of cervical region: Secondary | ICD-10-CM | POA: Diagnosis not present

## 2020-10-24 DIAGNOSIS — M9902 Segmental and somatic dysfunction of thoracic region: Secondary | ICD-10-CM | POA: Diagnosis not present

## 2020-11-04 DIAGNOSIS — M9904 Segmental and somatic dysfunction of sacral region: Secondary | ICD-10-CM | POA: Diagnosis not present

## 2020-11-04 DIAGNOSIS — M9901 Segmental and somatic dysfunction of cervical region: Secondary | ICD-10-CM | POA: Diagnosis not present

## 2020-11-04 DIAGNOSIS — M9902 Segmental and somatic dysfunction of thoracic region: Secondary | ICD-10-CM | POA: Diagnosis not present

## 2020-11-04 DIAGNOSIS — M9903 Segmental and somatic dysfunction of lumbar region: Secondary | ICD-10-CM | POA: Diagnosis not present

## 2020-12-17 ENCOUNTER — Other Ambulatory Visit: Payer: Self-pay

## 2020-12-17 ENCOUNTER — Encounter: Payer: Self-pay | Admitting: Family Medicine

## 2020-12-17 MED ORDER — TIZANIDINE HCL 2 MG PO CAPS: 2.0000 mg | ORAL_CAPSULE | Freq: Three times a day (TID) | ORAL | 2 refills | Status: DC

## 2021-01-27 ENCOUNTER — Telehealth: Payer: Self-pay

## 2021-01-27 NOTE — Chronic Care Management (AMB) (Signed)
    Chronic Care Management Pharmacy Assistant   Name: Jim Mathis.  MRN: [id number] DOB: January 02, 1951  Reason for Encounter: Disease State/ General Adherence Call  PCP : Jim Dark, MD  Allergies:  No Known Allergies  Medications: Outpatient Encounter Medications as of 01/27/2021  Medication Sig  . tizanidine (ZANAFLEX) 2 MG capsule Take 1 capsule (2 mg total) by mouth 3 (three) times daily.  Marland Kitchen amLODipine (NORVASC) 10 MG tablet Take 1 tablet (10 mg total) by mouth daily.  Marland Kitchen aspirin EC 81 MG tablet Take 81 mg by mouth daily.  Marland Kitchen atorvastatin (LIPITOR) 40 MG tablet Take 1 tablet (40 mg total) by mouth daily.  Marland Kitchen doxepin (SINEQUAN) 25 MG capsule Take 1 capsule (25 mg total) by mouth daily.  . finasteride (PROSCAR) 5 MG tablet Take 1 tablet (5 mg total) by mouth at bedtime.  Jim Mathis (ALLERGY EYE OP) Apply to eye as needed.  . tadalafil (CIALIS) 20 MG tablet TAKE 1/2 TABLET BY MOUTH DAILY  . triamcinolone ointment (KENALOG) 0.5 % Apply 1 application topically 3 (three) times daily.   No facility-administered encounter medications on file as of 01/27/2021.    Current Diagnosis: Patient Active Problem List   Diagnosis Date Noted  . Actinic keratosis 05/06/2020  . Nonintractable headache 08/22/2019  . BPH with ED and OAB 05/24/2019  . Essential hypertension 05/24/2019  . Dyslipidemia 05/24/2019  . Anxiety 05/24/2019  . Seborrheic dermatitis 05/24/2019  . Osteoarthritis 07/13/2012    Have you seen any other providers since your last visit with Jim Mathis, Pharm.D., BCGP?  Patient saw his Chiropractor every other day from dates 10/02/2020 - 11/04/2020 Jim Mathis, DC and Jim Mathis, DC.  Have you had any problems recently with your health?  Patient states he has not had any problems recently with his health.  Have you had any problems with your pharmacy?  Patient states he has not had any problems with his pharmacy.  What issues or side  effects are you having with your medications?  Patient states he is not currently having any issues or side effects from any of his medications.  What would you like me to pass along to Jim Mathis, [place].D., BCGP for them to help you with?   Patient states he doesn't have anything for me to pass along at this time.  What can we do to take care of you better?  Patient states "Everything seems to be going fine."   Future Appointments  Date Time Provider Department Center  03/09/2021  1:00 PM [facility] CCM PHARMACIST [facility]    Jim Mathis, [facility] Clinical Pharmacist Assistant [phone]    Follow-Up:  Pharmacist Review

## 2021-02-11 NOTE — Telephone Encounter (Signed)
TE and fill hx reviewed. Gap noted in atorvastatin fill hx. Will review with patient 02/2021 visit.

## 2021-03-09 ENCOUNTER — Ambulatory Visit: Payer: PPO

## 2021-03-09 NOTE — Progress Notes (Deleted)
  Chronic Care Management   Outreach Note   Name: Maxson Oddo. MRN: [id number] DOB: Sep 18, 1951  Referred by: Ardith Dark, MD Reason for referral: Telephone Appointment with [facility] Clinical Pharmacist, [name].   An unsuccessful telephone outreach was attempted today. The patient was referred to the pharmacist for assistance with care management and care coordination.   Telephone appointment with clinical pharmacist today (03/09/2021) at 1pm. If patient immediately returns call, transfer to [phone]. Otherwise, please provide this number so patient can reschedule visit.   [name], Pharm.D., BCGP Clinical Pharmacist [facility] [phone]

## 2021-03-10 ENCOUNTER — Telehealth: Payer: Self-pay | Admitting: Family Medicine

## 2021-03-10 NOTE — Chronic Care Management (AMB) (Signed)
  Chronic Care Management   Note  03/10/2021 Name: Jim Mathis. MRN: [id number] DOB: Apr 02, 1951  Jim Mathis. is a 70 y.o. year old male who is a primary care patient of Ardith Dark, MD. I reached out to [name]. by phone today in response to a referral sent by Mr. Jim Churches Jr.'s PCP, Ardith Dark, MD.   Jim Mathis was given information about Chronic Care Management services today including:  1. CCM service includes personalized support from designated clinical staff supervised by his physician, including individualized plan of care and coordination with other care providers 2. 24/7 contact phone numbers for assistance for urgent and routine care needs. 3. Service will only be billed when office clinical staff spend 20 minutes or more in a month to coordinate care. 4. Only one practitioner may furnish and bill the service in a calendar month. 5. The patient may stop CCM services at any time (effective at the end of the month) by phone call to the office staff.   Patient did not agree to enrollment in care management services and does not wish to consider at this time.  Follow up plan:   Carmell [place] [employer] Scheduler

## 2021-03-23 ENCOUNTER — Encounter: Payer: Self-pay | Admitting: Family Medicine

## 2021-03-23 NOTE — Telephone Encounter (Signed)
Patient called back

## 2021-03-23 NOTE — Telephone Encounter (Signed)
Left message to return call to our office at their convenience.  

## 2021-03-26 NOTE — Telephone Encounter (Signed)
See note

## 2021-03-27 ENCOUNTER — Other Ambulatory Visit: Payer: Self-pay | Admitting: *Deleted

## 2021-03-27 MED ORDER — TADALAFIL 20 MG PO TABS: 20.0000 mg | ORAL_TABLET | Freq: Every day | ORAL | 3 refills | Status: DC

## 2021-03-27 NOTE — Telephone Encounter (Signed)
Ok to change to 1/2 pill to 1 pill per Dr Jimmey Ralph  Rx send to pharmacy, patient notified

## 2021-04-17 ENCOUNTER — Ambulatory Visit: Payer: PPO

## 2021-04-20 ENCOUNTER — Encounter: Payer: Self-pay | Admitting: Family Medicine

## 2021-04-20 ENCOUNTER — Ambulatory Visit (INDEPENDENT_AMBULATORY_CARE_PROVIDER_SITE_OTHER): Payer: PPO

## 2021-04-20 ENCOUNTER — Other Ambulatory Visit: Payer: Self-pay

## 2021-04-20 VITALS — BP 133/67 | HR 72 | Temp 98.1°F | Wt 186.0 lb

## 2021-04-20 DIAGNOSIS — Z0000 Encounter for general adult medical examination without abnormal findings: Secondary | ICD-10-CM | POA: Diagnosis not present

## 2021-04-20 NOTE — Progress Notes (Signed)
Subjective:   Reshaun Briseno. is a 70 y.o. male who presents for Medicare Annual/Subsequent preventive examination.  Review of Systems     Cardiac Risk Factors include: advanced age (>33men, >74 women);hypertension;dyslipidemia;male gender     Objective:    Today's Vitals   04/20/21 1351  BP: 133/67  Pulse: 72  Temp: 98.1 F (36.7 C)  SpO2: 99%  Weight: 186 lb (84.4 kg)   Body mass index is 26.69 kg/m.  Advanced Directives 04/20/2021 03/11/2020 12/19/2019 08/15/2019 06/06/2019  Does Patient Have a Medical Advance Directive? Yes Yes Yes Yes Yes  Type of Advance Directive Living will Living will;Healthcare Power of [employer] Power of [employer] Power of [place];Living will -  Does patient want to make changes to medical advance directive? - No - Patient declined No - Patient declined - -  Copy of Healthcare Power of Attorney in Chart? - No - copy requested Yes - validated most recent copy scanned in chart (See row information) Yes - validated most recent copy scanned in chart (See row information) -    Current Medications (verified) Outpatient Encounter Medications as of 04/20/2021  Medication Sig  . amLODipine (NORVASC) 10 MG tablet Take 1 tablet (10 mg total) by mouth daily.  Marland Kitchen aspirin EC 81 MG tablet Take 81 mg by mouth daily.  Marland Kitchen atorvastatin (LIPITOR) 40 MG tablet Take 1 tablet (40 mg total) by mouth daily.  Marland Kitchen doxepin (SINEQUAN) 25 MG capsule Take 1 capsule (25 mg total) by mouth daily.  . finasteride (PROSCAR) 5 MG tablet Take 1 tablet (5 mg total) by mouth at bedtime.  March Rummage (ALLERGY EYE OP) Apply to eye as needed.  . tadalafil (CIALIS) 20 MG tablet Take 1 tablet (20 mg total) by mouth daily.  . tizanidine (ZANAFLEX) 2 MG capsule Take 1 capsule (2 mg total) by mouth 3 (three) times daily. (Patient taking differently: Take 2 mg by mouth 3 (three) times daily as needed. As needed)  . triamcinolone ointment (KENALOG) 0.5 % Apply 1  application topically 3 (three) times daily.  Marland Kitchen amoxicillin (AMOXIL) 500 MG capsule Take 1,000 mg by mouth 2 (two) times daily.   No facility-administered encounter medications on file as of 04/20/2021.    Allergies (verified) Patient has no known allergies.   History: Past Medical History:  Diagnosis Date  . BPH with obstruction/lower urinary tract symptoms   . Carotid stenosis, right    per pt was told he has some stenosis but not enough for surgery;   in epic under media ,  carotid ultrasound result right ICA 50-69% and incidental finding thyroid nodule  . Cervicalgia   . ED (erectile dysfunction)   . History of colon resection    per pt 12 hours after partial knee arthroplasty 2017,  s/p partial colectomy was possible from divertiulitis  . Hyperlipidemia   . Hypertension   . Migraines   . PAD (peripheral artery disease) (HCC) 08-14-2019  per pt when he walks (walks 2 miles a day) right foot gets numb but recovers quickly and when he rides his bike left hand gets numb by recovers quickly ;   denies claudication or swelling   per pt s/p  stenting to bilateral lower legs in [place] one in 2003 and the other 2007,  no follow up since moved to [facility]  . Wears glasses    Past Surgical History:  Procedure Laterality Date  . COLOSTOMY REVERSAL  02-21-2017     [place], [place] (OR record  scanned in epic)   w/ sigmoid and descending colectomy with appendectomy  . CYSTOSCOPY WITH INJECTION N/A 12/19/2019   Procedure: CYSTOSCOPY WITH INJECTION;  Surgeon: Sebastian Ache, MD;  Location: [facility];  Service: Urology;  Laterality: N/A;  . CYSTOSCOPY WITH URETHRAL DILATATION N/A 12/19/2019   Procedure: CYSTOSCOPY WITH URETHRAL DILATATION;  Surgeon: Sebastian Ache, MD;  Location: [facility];  Service: Urology;  Laterality: N/A;  45 MINS  . ENDOVASCULAR STENT INSERTION  2003 and 2007  --- both done in [place]   bilateral legs  . PARTIAL COLECTOMY  09-04-2016  in [place],  [place]   w/  colostomy for perferated divierticulitis  . PARTIAL KNEE ARTHROPLASTY Left 2017  . TOTAL KNEE ARTHROPLASTY Right 2012  . TRANSURETHRAL RESECTION OF PROSTATE N/A 08/15/2019   Procedure: TRANSURETHRAL RESECTION OF THE PROSTATE (TURP);  Surgeon: Sebastian Ache, MD;  Location: [facility];  Service: Urology;  Laterality: N/A;   Family History  Problem Relation Age of Onset  . Alzheimer's disease Mother   . Lung cancer Father        non smoker   Social History   Socioeconomic History  . Marital status: Married    Spouse name: Not on file  . Number of children: Not on file  . Years of education: Not on file  . Highest education level: Not on file  Occupational History  . Not on file  Tobacco Use  . Smoking status: Former Smoker    Years: 20.00    Types: Cigarettes    Quit date: 05/23/1994    Years since quitting: 26.9  . Smokeless tobacco: Never Used  Vaping Use  . Vaping Use: Never used  Substance and Sexual Activity  . Alcohol use: Yes    Comment: occasionally  . Drug use: Never  . Sexual activity: Yes  Other Topics Concern  . Not on file  Social History Narrative   Right handed   Lives in two story home with wife   Social Determinants of Health   Financial Resource Strain: Low Risk   . Difficulty of Paying Living Expenses: Not hard at all  Food Insecurity: No Food Insecurity  . Worried About Programme researcher, broadcasting/film/video in the Last Year: Never true  . Ran Out of Food in the Last Year: Never true  Transportation Needs: No Transportation Needs  . Lack of Transportation (Medical): No  . Lack of Transportation (Non-Medical): No  Physical Activity: Sufficiently Active  . Days of Exercise per Week: 5 days  . Minutes of Exercise per Session: 120 min  Stress: No Stress Concern Present  . Feeling of Stress : Not at all  Social Connections: Socially Integrated  . Frequency of Communication with Friends and Family: Once a week  . Frequency of Social  Gatherings with Friends and Family: Three times a week  . Attends Religious Services: 1 to 4 times per year  . Active Member of Clubs or Organizations: Yes  . Attends Banker Meetings: 1 to 4 times per year  . Marital Status: Married    Tobacco Counseling Counseling given: Not Answered   Clinical Intake:  Pre-visit preparation completed: Yes  Pain : No/denies pain     BMI - recorded: 26.69 Nutritional Status: BMI 25 -29 Overweight Nutritional Risks: None Diabetes: No  How often do you need to have someone help you when you read instructions, pamphlets, or other written materials from your doctor or pharmacy?: 1 - Never  Diabetic?No  Interpreter Needed?: No  Information entered by :: Lanier Ensignina Kirandeep Fariss, LPN   Activities of Daily Living In your present state of health, do you have any difficulty performing the following activities: 04/20/2021  Hearing? Y  Comment mild loss  Difficulty concentrating or making decisions? N  Walking or climbing stairs? N  Dressing or bathing? N  Doing errands, shopping? N  Preparing Food and eating ? N  Using the Toilet? N  In the past six months, have you accidently leaked urine? N  Do you have problems with loss of bowel control? N  Managing your Medications? N  Managing your Finances? N  Housekeeping or managing your Housekeeping? N  Some recent data might be hidden    Patient Care Team: Ardith DarkParker, Caleb M, MD as PCP - General (Family Medicine) Drema DallasJaffe, Adam R, DO as Consulting Physician (Neurology) Sebastian AcheManny, Theodore, MD as Consulting Physician (Urology) Dahlia ByesPotts, Jacob, [facility] as Pharmacist (Pharmacist)  Indicate any recent Medical Services you may have received from other than [facility] providers in the past year (date may be approximate).     Assessment:   This is a routine wellness examination for Ramon Dredgedward.  Hearing/Vision screen  Hearing Screening   125Hz  250Hz  500Hz  1000Hz  2000Hz  3000Hz  4000Hz  6000Hz  8000Hz   Right ear:            Left ear:           Comments: Pt states mild loss   Vision Screening Comments: Pt follows up with Provider in town last Eye exams was in [place] 2020  Dietary issues and exercise activities discussed: Current Exercise Habits: Home exercise routine;Structured exercise class, Type of exercise: Other - see comments (biking and hiking), Time (Minutes): > 60, Frequency (Times/Week): 5, Weekly Exercise (Minutes/Week): 0  Goals Addressed            This Visit's Progress   . Patient Stated       Stay active       Depression Screen PHQ 2/9 Scores 04/20/2021 03/11/2020 05/24/2019  PHQ - 2 Score 0 0 0  PHQ- 9 Score - - 1    Fall Risk Fall Risk  04/20/2021 03/11/2020 06/06/2019 05/24/2019  Falls in the past year? 0 0 0 0  Number falls in past yr: 0 0 - -  Injury with Fall? 0 0 - 0  Risk for fall due to : Impaired vision;Impaired balance/gait - - -  Follow up Falls prevention discussed Falls evaluation completed;Education provided;Falls prevention discussed - Falls evaluation completed    FALL RISK PREVENTION PERTAINING TO THE HOME:  Any stairs in or around the home? Yes  If so, are there any without handrails? No  Home free of loose throw rugs in walkways, pet beds, electrical cords, etc? Yes  Adequate lighting in your home to reduce risk of falls? Yes   ASSISTIVE DEVICES UTILIZED TO PREVENT FALLS:  Life alert? No  Use of a cane, walker or w/c? No  Grab bars in the bathroom? No  Shower chair or bench in shower? Yes  Elevated toilet seat or a handicapped toilet? No   TIMED UP AND GO:  Was the test performed? Yes .  Length of time to ambulate 10 feet: 10 sec.   Gait steady and fast without use of assistive device  Cognitive Function:     6CIT Screen 04/20/2021 03/11/2020  What Year? 0 points 0 points  What month? 0 points 0 points  What time? - 0 points  Count back from 20  0 points 0 points  Months in reverse 0 points 0 points  Repeat phrase 0 points 0 points  Total Score - 0     Immunizations Immunization History  Administered Date(s) Administered  . Influenza, High Dose Seasonal PF 08/03/2019  . Influenza-Unspecified 09/23/2020  . PFIZER(Purple Top)SARS-COV-2 Vaccination 02/02/2020, 02/27/2020, 09/23/2020  . Pneumococcal Conjugate-13 01/25/2016  . Pneumococcal Polysaccharide-23 01/24/2017  . Tdap 02/08/2018  . Zoster 04/09/2020  . Zoster Recombinat (Shingrix) 07/15/2019    TDAP status: Up to date  Flu Vaccine status: Up to date  Pneumococcal vaccine status: Up to date  Covid-19 vaccine status: Completed vaccines  Qualifies for Shingles Vaccine? Yes   Zostavax completed Yes   Shingrix Completed?: Yes  Screening Tests Health Maintenance  Topic Date Due  . Hepatitis C Screening  Never done  . COVID-19 Vaccine (4 - Booster for Pfizer series) 03/24/2021  . INFLUENZA VACCINE  07/20/2021  . COLONOSCOPY (Pts 45-25yrs Insurance coverage will need to be confirmed)  01/26/2027  . TETANUS/TDAP  02/09/2028  . PNA vac Low Risk Adult  Completed  . HPV VACCINES  Aged Out    Health Maintenance  Health Maintenance Due  Topic Date Due  . Hepatitis C Screening  Never done  . COVID-19 Vaccine (4 - Booster for Pfizer series) 03/24/2021    Colorectal cancer screening: Type of screening: Colonoscopy. Completed 01/26/17. Repeat every 10 years   Additional Screening:  Hepatitis C Screening: does qualify;   Vision Screening: Recommended annual ophthalmology exams for early detection of glaucoma and other disorders of the eye. Is the patient up to date with their annual eye exam?  No  Who is the provider or what is the name of the office in which the patient attends annual eye exams? Will make an appt with in town provider  If pt is not established with a provider, would they like to be referred to a provider to establish care? No .   Dental Screening: Recommended annual dental exams for proper oral hygiene  Community Resource Referral / Chronic Care  Management: CRR required this visit?  No   CCM required this visit?  No      Plan:     I have personally reviewed and noted the following in the patient's chart:   . Medical and social history . Use of alcohol, tobacco or illicit drugs  . Current medications and supplements including opioid prescriptions. Patient is not currently taking opioid prescriptions. . Functional ability and status . Nutritional status . Physical activity . Advanced directives . List of other physicians . Hospitalizations, surgeries, and ER visits in previous 12 months . Vitals . Screenings to include cognitive, depression, and falls . Referrals and appointments  In addition, I have reviewed and discussed with patient certain preventive protocols, quality metrics, and best practice recommendations. A written personalized care plan for preventive services as well as general preventive health recommendations were provided to patient.     Marzella Schlein, LPN   12/26/4942   Nurse Notes: None

## 2021-04-20 NOTE — Patient Instructions (Addendum)
[name] , Thank you for taking time to come for your Medicare Wellness Visit. I appreciate your ongoing commitment to your health goals. Please review the following plan we discussed and let me know if I can assist you in the future.   Screening recommendations/referrals: Colonoscopy: Done 01/26/17 Recommended yearly ophthalmology/optometry visit for glaucoma screening and checkup Recommended yearly dental visit for hygiene and checkup  Vaccinations: Influenza vaccine: Up to date Pneumococcal vaccine: Up to date Tdap vaccine: Up to date Shingles vaccine: 1st dose 07/15/19  & 04/09/20 Covid-19: Completed 2/13, 3/10, & 09/23/20  Advanced directives: Please bring a copy of your health care power of attorney and living will to the office at your convenience.  Conditions/risks identified: Continue to stay active   Next appointment: Follow up in one year for your annual wellness visit.   Preventive Care 13 Years and Older, Male Preventive care refers to lifestyle choices and visits with your health care provider that can promote health and wellness. What does preventive care include?  A yearly physical exam. This is also called an annual well check.  Dental exams once or twice a year.  Routine eye exams. Ask your health care provider how often you should have your eyes checked.  Personal lifestyle choices, including:  Daily care of your teeth and gums.  Regular physical activity.  Eating a healthy diet.  Avoiding tobacco and drug use.  Limiting alcohol use.  Practicing safe sex.  Taking low doses of aspirin every day.  Taking vitamin and mineral supplements as recommended by your health care provider. What happens during an annual well check? The services and screenings done by your health care provider during your annual well check will depend on your age, overall health, lifestyle risk factors, and family history of disease. Counseling  Your health care provider may ask  you questions about your:  Alcohol use.  Tobacco use.  Drug use.  Emotional well-being.  Home and relationship well-being.  Sexual activity.  Eating habits.  History of falls.  Memory and ability to understand (cognition).  Work and work Astronomer. Screening  You may have the following tests or measurements:  Height, weight, and BMI.  Blood pressure.  Lipid and cholesterol levels. These may be checked every 5 years, or more frequently if you are over 58 years old.  Skin check.  Lung cancer screening. You may have this screening every year starting at age 20 if you have a 30-pack-year history of smoking and currently smoke or have quit within the past 15 years.  Fecal occult blood test (FOBT) of the stool. You may have this test every year starting at age 109.  Flexible sigmoidoscopy or colonoscopy. You may have a sigmoidoscopy every 5 years or a colonoscopy every 10 years starting at age 79.  Prostate cancer screening. Recommendations will vary depending on your family history and other risks.  Hepatitis C blood test.  Hepatitis B blood test.  Sexually transmitted disease (STD) testing.  Diabetes screening. This is done by checking your blood sugar (glucose) after you have not eaten for a while (fasting). You may have this done every 1-3 years.  Abdominal aortic aneurysm (AAA) screening. You may need this if you are a current or former smoker.  Osteoporosis. You may be screened starting at age 52 if you are at high risk. Talk with your health care provider about your test results, treatment options, and if necessary, the need for more tests. Vaccines  Your health care provider may  recommend certain vaccines, such as:  Influenza vaccine. This is recommended every year.  Tetanus, diphtheria, and acellular pertussis (Tdap, Td) vaccine. You may need a Td booster every 10 years.  Zoster vaccine. You may need this after age 26.  Pneumococcal 13-valent conjugate  (PCV13) vaccine. One dose is recommended after age 59.  Pneumococcal polysaccharide (PPSV23) vaccine. One dose is recommended after age 61. Talk to your health care provider about which screenings and vaccines you need and how often you need them. This information is not intended to replace advice given to you by your health care provider. Make sure you discuss any questions you have with your health care provider. Document Released: 01/02/2016 Document Revised: 08/25/2016 Document Reviewed: 10/07/2015 Elsevier Interactive Patient Education  2017 [place] Prevention in the Home Falls can cause injuries. They can happen to people of all ages. There are many things you can do to make your home safe and to help prevent falls. What can I do on the outside of my home?  Regularly fix the edges of walkways and driveways and fix any cracks.  Remove anything that might make you trip as you walk through a door, such as a raised step or threshold.  Trim any bushes or trees on the path to your home.  Use bright outdoor lighting.  Clear any walking paths of anything that might make someone trip, such as rocks or tools.  Regularly check to see if handrails are loose or broken. Make sure that both sides of any steps have handrails.  Any raised decks and porches should have guardrails on the edges.  Have any leaves, snow, or ice cleared regularly.  Use sand or salt on walking paths during winter.  Clean up any spills in your garage right away. This includes oil or grease spills. What can I do in the bathroom?  Use night lights.  Install grab bars by the toilet and in the tub and shower. Do not use towel bars as grab bars.  Use non-skid mats or decals in the tub or shower.  If you need to sit down in the shower, use a plastic, non-slip stool.  Keep the floor dry. Clean up any water that spills on the floor as soon as it happens.  Remove soap buildup in the tub or shower  regularly.  Attach bath mats securely with double-sided non-slip rug tape.  Do not have throw rugs and other things on the floor that can make you trip. What can I do in the bedroom?  Use night lights.  Make sure that you have a light by your bed that is easy to reach.  Do not use any sheets or blankets that are too big for your bed. They should not hang down onto the floor.  Have a firm chair that has side arms. You can use this for support while you get dressed.  Do not have throw rugs and other things on the floor that can make you trip. What can I do in the kitchen?  Clean up any spills right away.  Avoid walking on wet floors.  Keep items that you use a lot in easy-to-reach places.  If you need to reach something above you, use a strong step stool that has a grab bar.  Keep electrical cords out of the way.  Do not use floor polish or wax that makes floors slippery. If you must use wax, use non-skid floor wax.  Do not have throw rugs and  other things on the floor that can make you trip. What can I do with my stairs?  Do not leave any items on the stairs.  Make sure that there are handrails on both sides of the stairs and use them. Fix handrails that are broken or loose. Make sure that handrails are as long as the stairways.  Check any carpeting to make sure that it is firmly attached to the stairs. Fix any carpet that is loose or worn.  Avoid having throw rugs at the top or bottom of the stairs. If you do have throw rugs, attach them to the floor with carpet tape.  Make sure that you have a light switch at the top of the stairs and the bottom of the stairs. If you do not have them, ask someone to add them for you. What else can I do to help prevent falls?  Wear shoes that:  Do not have high heels.  Have rubber bottoms.  Are comfortable and fit you well.  Are closed at the toe. Do not wear sandals.  If you use a stepladder:  Make sure that it is fully  opened. Do not climb a closed stepladder.  Make sure that both sides of the stepladder are locked into place.  Ask someone to hold it for you, if possible.  Clearly mark and make sure that you can see:  Any grab bars or handrails.  First and last steps.  Where the edge of each step is.  Use tools that help you move around (mobility aids) if they are needed. These include:  Canes.  Walkers.  Scooters.  Crutches.  Turn on the lights when you go into a dark area. Replace any light bulbs as soon as they burn out.  Set up your furniture so you have a clear path. Avoid moving your furniture around.  If any of your floors are uneven, fix them.  If there are any pets around you, be aware of where they are.  Review your medicines with your doctor. Some medicines can make you feel dizzy. This can increase your chance of falling. Ask your doctor what other things that you can do to help prevent falls. This information is not intended to replace advice given to you by your health care provider. Make sure you discuss any questions you have with your health care provider. Document Released: 10/02/2009 Document Revised: 05/13/2016 Document Reviewed: 01/10/2015 Elsevier Interactive Patient Education  2017 [employer].

## 2021-04-24 ENCOUNTER — Encounter: Payer: Self-pay | Admitting: Family Medicine

## 2021-05-07 ENCOUNTER — Other Ambulatory Visit: Payer: Self-pay

## 2021-05-07 ENCOUNTER — Encounter (HOSPITAL_BASED_OUTPATIENT_CLINIC_OR_DEPARTMENT_OTHER): Payer: Self-pay

## 2021-05-07 ENCOUNTER — Inpatient Hospital Stay (HOSPITAL_BASED_OUTPATIENT_CLINIC_OR_DEPARTMENT_OTHER)
Admission: EM | Admit: 2021-05-07 | Discharge: 2021-05-10 | DRG: 390 | Disposition: A | Discharge status: Home / Self Care | Payer: PPO | Attending: Internal Medicine | Admitting: Internal Medicine

## 2021-05-07 ENCOUNTER — Emergency Department (HOSPITAL_BASED_OUTPATIENT_CLINIC_OR_DEPARTMENT_OTHER): Payer: PPO

## 2021-05-07 DIAGNOSIS — Z79899 Other long term (current) drug therapy: Secondary | ICD-10-CM

## 2021-05-07 DIAGNOSIS — Z96651 Presence of right artificial knee joint: Secondary | ICD-10-CM | POA: Diagnosis present

## 2021-05-07 DIAGNOSIS — K5651 Intestinal adhesions [bands], with partial obstruction: Principal | ICD-10-CM | POA: Diagnosis present

## 2021-05-07 DIAGNOSIS — K56600 Partial intestinal obstruction, unspecified as to cause: Secondary | ICD-10-CM | POA: Diagnosis not present

## 2021-05-07 DIAGNOSIS — Z20822 Contact with and (suspected) exposure to covid-19: Secondary | ICD-10-CM | POA: Diagnosis not present

## 2021-05-07 DIAGNOSIS — R109 Unspecified abdominal pain: Secondary | ICD-10-CM | POA: Diagnosis not present

## 2021-05-07 DIAGNOSIS — N400 Enlarged prostate without lower urinary tract symptoms: Secondary | ICD-10-CM | POA: Diagnosis present

## 2021-05-07 DIAGNOSIS — Z87891 Personal history of nicotine dependence: Secondary | ICD-10-CM

## 2021-05-07 DIAGNOSIS — I10 Essential (primary) hypertension: Secondary | ICD-10-CM | POA: Diagnosis present

## 2021-05-07 DIAGNOSIS — Z7982 Long term (current) use of aspirin: Secondary | ICD-10-CM | POA: Diagnosis not present

## 2021-05-07 DIAGNOSIS — Z9049 Acquired absence of other specified parts of digestive tract: Secondary | ICD-10-CM

## 2021-05-07 DIAGNOSIS — K310 Acute dilatation of stomach: Secondary | ICD-10-CM | POA: Diagnosis not present

## 2021-05-07 DIAGNOSIS — K56609 Unspecified intestinal obstruction, unspecified as to partial versus complete obstruction: Principal | ICD-10-CM | POA: Diagnosis present

## 2021-05-07 DIAGNOSIS — Z452 Encounter for adjustment and management of vascular access device: Secondary | ICD-10-CM | POA: Diagnosis not present

## 2021-05-07 DIAGNOSIS — I739 Peripheral vascular disease, unspecified: Secondary | ICD-10-CM

## 2021-05-07 DIAGNOSIS — E785 Hyperlipidemia, unspecified: Secondary | ICD-10-CM | POA: Diagnosis present

## 2021-05-07 LAB — CBC WITH DIFFERENTIAL/PLATELET
Abs Immature Granulocytes: 0.03 10*3/uL (ref 0.00–0.07)
Basophils Absolute: 0.1 10*3/uL (ref 0.0–0.1)
Basophils Relative: 1 %
Eosinophils Absolute: 0.2 10*3/uL (ref 0.0–0.5)
Eosinophils Relative: 1 %
HCT: 50.2 % (ref 39.0–52.0)
Hemoglobin: 16.3 g/dL (ref 13.0–17.0)
Immature Granulocytes: 0 %
Lymphocytes Relative: 11 %
Lymphs Abs: 1.2 10*3/uL (ref 0.7–4.0)
MCH: 29.9 pg (ref 26.0–34.0)
MCHC: 32.5 g/dL (ref 30.0–36.0)
MCV: 91.9 fL (ref 80.0–100.0)
Monocytes Absolute: 0.9 10*3/uL (ref 0.1–1.0)
Monocytes Relative: 8 %
Neutro Abs: 8.6 10*3/uL — ABNORMAL HIGH (ref 1.7–7.7)
Neutrophils Relative %: 79 %
Platelets: 233 10*3/uL (ref 150–400)
RBC: 5.46 MIL/uL (ref 4.22–5.81)
RDW: 13 % (ref 11.5–15.5)
WBC: 10.9 10*3/uL — ABNORMAL HIGH (ref 4.0–10.5)
nRBC: 0 % (ref 0.0–0.2)

## 2021-05-07 LAB — COMPREHENSIVE METABOLIC PANEL
ALT: 16 U/L (ref 0–44)
AST: 21 U/L (ref 15–41)
Albumin: 4.3 g/dL (ref 3.5–5.0)
Alkaline Phosphatase: 93 U/L (ref 38–126)
Anion gap: 10 (ref 5–15)
BUN: 18 mg/dL (ref 8–23)
CO2: 26 mmol/L (ref 22–32)
Calcium: 9.6 mg/dL (ref 8.9–10.3)
Chloride: 102 mmol/L (ref 98–111)
Creatinine, Ser: 1.04 mg/dL (ref 0.61–1.24)
GFR, Estimated: >60 mL/min (ref >60)
Glucose, Bld: 87 mg/dL (ref 70–99)
Potassium: 4 mmol/L (ref 3.5–5.1)
Sodium: 138 mmol/L (ref 135–145)
Total Bilirubin: 0.6 mg/dL (ref 0.3–1.2)
Total Protein: 7.8 g/dL (ref 6.5–8.1)

## 2021-05-07 LAB — RESP PANEL BY RT-PCR (FLU A&B, COVID) ARPGX2
Influenza A by PCR: NEGATIVE
Influenza B by PCR: NEGATIVE
SARS Coronavirus 2 by RT PCR: NEGATIVE

## 2021-05-07 LAB — LIPASE, BLOOD: Lipase: 18 U/L (ref 11–51)

## 2021-05-07 MED ORDER — SODIUM CHLORIDE 0.9 % IV BOLUS
500.0000 mL | Freq: Once | INTRAVENOUS | Status: AC
  Administered 2021-05-07: 500 mL via INTRAVENOUS

## 2021-05-07 MED ORDER — FENTANYL CITRATE (PF) 100 MCG/2ML IJ SOLN
50.0000 ug | Freq: Once | INTRAMUSCULAR | Status: AC
  Administered 2021-05-07: 50 ug via INTRAVENOUS
  Filled 2021-05-07: qty 2

## 2021-05-07 MED ORDER — IOHEXOL 9 MG/ML PO SOLN
500.0000 mL | Freq: Two times a day (BID) | ORAL | Status: DC | PRN
  Administered 2021-05-07: 500 mL via ORAL

## 2021-05-07 MED ORDER — ONDANSETRON HCL 4 MG/2ML IJ SOLN
4.0000 mg | Freq: Once | INTRAMUSCULAR | Status: AC
  Administered 2021-05-07: 4 mg via INTRAVENOUS
  Filled 2021-05-07: qty 2

## 2021-05-07 NOTE — ED Triage Notes (Signed)
Patient here POV from Home with ABD Pain, Nausea, and Diarrhea.   Symptoms began this AM at 0400. Symptoms are intermittent. Pain is across ABD.   Ambulatory, NAD, GCS 15.

## 2021-05-07 NOTE — ED Notes (Signed)
EDP at Bedside 

## 2021-05-07 NOTE — ED Provider Notes (Signed)
[facility] EMERGENCY DEPT Provider Note   CSN: [id number] Arrival date & time: 05/07/21  2151     History Chief Complaint  Patient presents with  . Abdominal Pain  . Nausea  . Diarrhea    Jim Mathis. is a 70 y.o. male.  HPI Patient presents with some nausea abdominal pain and diarrhea.  States abdomen is more swollen.  Woke up with it around 4:00 in the morning.  Continued pain.  Has had previous colon resection after knee replacement developed perforated bowel and was in the hospital for 30 days.  Had colostomy for a while.  States he has had diarrhea.  Nauseousness.  Pain is crampy and worse on the left side.  States he is worried since he waited too long last time.    Past Medical History:  Diagnosis Date  . BPH with obstruction/lower urinary tract symptoms   . Carotid stenosis, right    per pt was told he has some stenosis but not enough for surgery;   in epic under media ,  carotid ultrasound result right ICA 50-69% and incidental finding thyroid nodule  . Cervicalgia   . ED (erectile dysfunction)   . History of colon resection    per pt 12 hours after partial knee arthroplasty 2017,  s/p partial colectomy was possible from divertiulitis  . Hyperlipidemia   . Hypertension   . Migraines   . PAD (peripheral artery disease) (HCC) 08-14-2019  per pt when he walks (walks 2 miles a day) right foot gets numb but recovers quickly and when he rides his bike left hand gets numb by recovers quickly ;   denies claudication or swelling   per pt s/p  stenting to bilateral lower legs in [place] one in 2003 and the other 2007,  no follow up since moved to [facility]  . Wears glasses     Patient Active Problem List   Diagnosis Date Noted  . Actinic keratosis 05/06/2020  . Nonintractable headache 08/22/2019  . BPH with ED and OAB 05/24/2019  . Essential hypertension 05/24/2019  . Dyslipidemia 05/24/2019  . Anxiety 05/24/2019  . Seborrheic dermatitis 05/24/2019  .  Osteoarthritis 07/13/2012    Past Surgical History:  Procedure Laterality Date  . COLOSTOMY REVERSAL  02-21-2017     [place], [place] (OR record scanned in epic)   w/ sigmoid and descending colectomy with appendectomy  . CYSTOSCOPY WITH INJECTION N/A 12/19/2019   Procedure: CYSTOSCOPY WITH INJECTION;  Surgeon: Sebastian Ache, MD;  Location[facility];  Service: Urology;  Laterality: N/A;  . CYSTOSCOPY WITH URETHRAL DILATATION N/A 12/19/2019   Procedure: CYSTOSCOPY WITH URETHRAL DILATATION;  Surgeon: Sebastian Ache, MD;  Location: [facility];  Service: Urology;  Laterality: N/A;  45 MINS  . ENDOVASCULAR STENT INSERTION  2003 and 2007  --- both done in [place]   bilateral legs  . PARTIAL COLECTOMY  09-04-2016  in [place], [place]   w/  colostomy for perferated divierticulitis  . PARTIAL KNEE ARTHROPLASTY Left 2017  . TOTAL KNEE ARTHROPLASTY Right 2012  . TRANSURETHRAL RESECTION OF PROSTATE N/A 08/15/2019   Procedure: TRANSURETHRAL RESECTION OF THE PROSTATE (TURP);  Surgeon: Sebastian Ache, MD;  Location: [facility];  Service: Urology;  Laterality: N/A;       Family History  Problem Relation Age of Onset  . Alzheimer's disease Mother   . Lung cancer Father        non smoker    Social History  Tobacco Use  . Smoking status: Former Smoker    Years: 20.00    Types: Cigarettes    Quit date: 05/23/1994    Years since quitting: 26.9  . Smokeless tobacco: Never Used  Vaping Use  . Vaping Use: Never used  Substance Use Topics  . Alcohol use: Yes    Comment: occasionally  . Drug use: Never    Home Medications Prior to Admission medications   Medication Sig Start Date End Date Taking? Authorizing Provider  amLODipine (NORVASC) 10 MG tablet Take 1 tablet (10 mg total) by mouth daily. 05/06/20   Ardith Dark, MD  amoxicillin (AMOXIL) 500 MG capsule Take 1,000 mg by mouth 2 (two) times daily. 04/13/21   [provider]  aspirin  EC 81 MG tablet Take 81 mg by mouth daily.    [provider]  atorvastatin (LIPITOR) 40 MG tablet Take 1 tablet (40 mg total) by mouth daily. 05/06/20   Ardith Dark, MD  doxepin (SINEQUAN) 25 MG capsule Take 1 capsule (25 mg total) by mouth daily. 05/06/20   Ardith Dark, MD  finasteride (PROSCAR) 5 MG tablet Take 1 tablet (5 mg total) by mouth at bedtime. 05/06/20   Ardith Dark, MD  Naphazoline-Pheniramine (ALLERGY EYE OP) Apply to eye as needed.    [provider]  tadalafil (CIALIS) 20 MG tablet Take 1 tablet (20 mg total) by mouth daily. 03/27/21   Ardith Dark, MD  tizanidine (ZANAFLEX) 2 MG capsule Take 1 capsule (2 mg total) by mouth 3 (three) times daily. Patient taking differently: Take 2 mg by mouth 3 (three) times daily as needed. As needed 12/17/20   Ardith Dark, MD  triamcinolone ointment (KENALOG) 0.5 % Apply 1 application topically 3 (three) times daily. 10/13/20   Ardith Dark, MD    Allergies    Patient has no known allergies.  Review of Systems   Review of Systems  Constitutional: Positive for appetite change. Negative for fatigue and fever.  HENT: Negative for congestion.   Respiratory: Negative for shortness of breath.   Cardiovascular: Negative for chest pain.  Gastrointestinal: Positive for abdominal distention, abdominal pain, diarrhea and nausea. Negative for vomiting.  Genitourinary: Negative for flank pain.  Musculoskeletal: Negative for back pain.  Skin: Negative for rash.  Neurological: Negative for weakness.  Psychiatric/Behavioral: Negative for confusion.    Physical Exam Updated Vital Signs BP (!) 155/81 (BP Location: Right Arm)   Pulse 80   Temp 98.2 F (36.8 C) (Oral)   Resp 16   Ht 5\' 10"  (1.778 m)   Wt 82.6 kg   SpO2 100%   BMI 26.11 kg/m   Physical Exam Vitals and nursing note reviewed.  Constitutional:      Comments: Patient appears uncomfortable  HENT:     Head: Normocephalic.  Cardiovascular:      Rate and Rhythm: Normal rate and regular rhythm.  Pulmonary:     Breath sounds: No wheezing.  Abdominal:     Hernia: No hernia is present.     Comments: Diffuse abdominal tenderness but worse on the left.  Some distention.  No hernias palpated.  Scar from previous surgery.  Skin:    General: Skin is warm.     Capillary Refill: Capillary refill takes less than 2 seconds.  Neurological:     Mental Status: He is alert and oriented to person, place, and time.     ED Results / Procedures / Treatments  Labs (all labs ordered are listed, but only abnormal results are displayed) Labs Reviewed  COMPREHENSIVE METABOLIC PANEL  LIPASE, BLOOD  CBC WITH DIFFERENTIAL/PLATELET    EKG None  Radiology No results found.  Procedures Procedures   Medications Ordered in ED Medications  iohexol (OMNIPAQUE) 9 MG/ML oral solution 500 mL (has no administration in time range)  ondansetron (ZOFRAN) injection 4 mg (4 mg Intravenous Given 05/07/21 2211)  fentaNYL (SUBLIMAZE) injection 50 mcg (50 mcg Intravenous Given 05/07/21 2211)  sodium chloride 0.9 % bolus 500 mL (500 mLs Intravenous New Bag/Given 05/07/21 2216)    ED Course  I have reviewed the triage vital signs and the nursing notes.  Pertinent labs & imaging results that were available during my care of the patient were reviewed by me and considered in my medical decision making (see chart for details).    MDM Rules/Calculators/A&P                          Patient presents with nausea abdominal pain distention diarrhea.  Previous abdominal surgeries.  Pain began this morning at around 4 AM.  We will get lab work and CT scan.  Is on amoxicillin for dental work.  Care turned over to Dr. Daun Peacock. Final Clinical Impression(s) / ED Diagnoses Final diagnoses:  None    Rx / DC Orders ED Discharge Orders    None       Benjiman Core, MD 05/07/21 2222

## 2021-05-07 NOTE — ED Notes (Signed)
Patient transferred to CT at this time

## 2021-05-08 ENCOUNTER — Inpatient Hospital Stay (HOSPITAL_COMMUNITY): Payer: PPO

## 2021-05-08 ENCOUNTER — Encounter (HOSPITAL_BASED_OUTPATIENT_CLINIC_OR_DEPARTMENT_OTHER): Payer: Self-pay | Admitting: Emergency Medicine

## 2021-05-08 ENCOUNTER — Emergency Department (HOSPITAL_BASED_OUTPATIENT_CLINIC_OR_DEPARTMENT_OTHER): Payer: PPO

## 2021-05-08 DIAGNOSIS — Z452 Encounter for adjustment and management of vascular access device: Secondary | ICD-10-CM | POA: Diagnosis not present

## 2021-05-08 DIAGNOSIS — I739 Peripheral vascular disease, unspecified: Secondary | ICD-10-CM | POA: Diagnosis present

## 2021-05-08 DIAGNOSIS — Z96651 Presence of right artificial knee joint: Secondary | ICD-10-CM | POA: Diagnosis present

## 2021-05-08 DIAGNOSIS — Z20822 Contact with and (suspected) exposure to covid-19: Secondary | ICD-10-CM | POA: Diagnosis present

## 2021-05-08 DIAGNOSIS — K56609 Unspecified intestinal obstruction, unspecified as to partial versus complete obstruction: Secondary | ICD-10-CM | POA: Diagnosis present

## 2021-05-08 DIAGNOSIS — Z87891 Personal history of nicotine dependence: Secondary | ICD-10-CM | POA: Diagnosis not present

## 2021-05-08 DIAGNOSIS — E785 Hyperlipidemia, unspecified: Secondary | ICD-10-CM | POA: Diagnosis present

## 2021-05-08 DIAGNOSIS — I10 Essential (primary) hypertension: Secondary | ICD-10-CM

## 2021-05-08 DIAGNOSIS — Z79899 Other long term (current) drug therapy: Secondary | ICD-10-CM | POA: Diagnosis not present

## 2021-05-08 DIAGNOSIS — Z9049 Acquired absence of other specified parts of digestive tract: Secondary | ICD-10-CM | POA: Diagnosis not present

## 2021-05-08 DIAGNOSIS — Z7982 Long term (current) use of aspirin: Secondary | ICD-10-CM | POA: Diagnosis not present

## 2021-05-08 DIAGNOSIS — N400 Enlarged prostate without lower urinary tract symptoms: Secondary | ICD-10-CM | POA: Diagnosis present

## 2021-05-08 DIAGNOSIS — K5651 Intestinal adhesions [bands], with partial obstruction: Secondary | ICD-10-CM | POA: Diagnosis present

## 2021-05-08 LAB — BASIC METABOLIC PANEL
Anion gap: 8 (ref 5–15)
BUN: 16 mg/dL (ref 8–23)
CO2: 25 mmol/L (ref 22–32)
Calcium: 9.5 mg/dL (ref 8.9–10.3)
Chloride: 103 mmol/L (ref 98–111)
Creatinine, Ser: 0.92 mg/dL (ref 0.61–1.24)
GFR, Estimated: >60 mL/min (ref >60)
Glucose, Bld: 113 mg/dL — ABNORMAL HIGH (ref 70–99)
Potassium: 4.3 mmol/L (ref 3.5–5.1)
Sodium: 136 mmol/L (ref 135–145)

## 2021-05-08 LAB — CBC
HCT: 46.3 % (ref 39.0–52.0)
Hemoglobin: 15.3 g/dL (ref 13.0–17.0)
MCH: 30.2 pg (ref 26.0–34.0)
MCHC: 33 g/dL (ref 30.0–36.0)
MCV: 91.5 fL (ref 80.0–100.0)
Platelets: 242 10*3/uL (ref 150–400)
RBC: 5.06 MIL/uL (ref 4.22–5.81)
RDW: 12.7 % (ref 11.5–15.5)
WBC: 10.4 10*3/uL (ref 4.0–10.5)
nRBC: 0 % (ref 0.0–0.2)

## 2021-05-08 LAB — HIV ANTIBODY (ROUTINE TESTING W REFLEX): HIV Screen 4th Generation wRfx: NONREACTIVE

## 2021-05-08 MED ORDER — PHENOL 1.4 % MT LIQD
1.0000 | OROMUCOSAL | Status: DC | PRN
  Administered 2021-05-08: 1 via OROMUCOSAL
  Filled 2021-05-08: qty 177

## 2021-05-08 MED ORDER — ONDANSETRON HCL 4 MG/2ML IJ SOLN
4.0000 mg | Freq: Four times a day (QID) | INTRAMUSCULAR | Status: DC | PRN
  Administered 2021-05-08 (×3): 4 mg via INTRAVENOUS
  Filled 2021-05-08 (×3): qty 2

## 2021-05-08 MED ORDER — ACETAMINOPHEN 650 MG RE SUPP: 650.0000 mg | Freq: Four times a day (QID) | RECTAL | Status: DC | PRN

## 2021-05-08 MED ORDER — LACTATED RINGERS IV SOLN: INTRAVENOUS | Status: AC

## 2021-05-08 MED ORDER — ONDANSETRON HCL 4 MG PO TABS: 4.0000 mg | ORAL_TABLET | Freq: Four times a day (QID) | ORAL | Status: DC | PRN

## 2021-05-08 MED ORDER — MORPHINE SULFATE (PF) 2 MG/ML IV SOLN
1.0000 mg | INTRAVENOUS | Status: DC | PRN
  Administered 2021-05-08: 2 mg via INTRAVENOUS
  Filled 2021-05-08: qty 1

## 2021-05-08 MED ORDER — FENTANYL CITRATE (PF) 100 MCG/2ML IJ SOLN
25.0000 ug | INTRAMUSCULAR | Status: DC | PRN
  Administered 2021-05-08 (×4): 25 ug via INTRAVENOUS
  Filled 2021-05-08 (×4): qty 2

## 2021-05-08 MED ORDER — DIATRIZOATE MEGLUMINE & SODIUM 66-10 % PO SOLN
90.0000 mL | Freq: Once | ORAL | Status: AC
  Administered 2021-05-08: 90 mL via NASOGASTRIC
  Filled 2021-05-08: qty 90

## 2021-05-08 MED ORDER — LABETALOL HCL 5 MG/ML IV SOLN: 10.0000 mg | INTRAVENOUS | Status: DC | PRN

## 2021-05-08 MED ORDER — ENOXAPARIN SODIUM 40 MG/0.4ML IJ SOSY
40.0000 mg | PREFILLED_SYRINGE | INTRAMUSCULAR | Status: DC
  Administered 2021-05-08 – 2021-05-09 (×2): 40 mg via SUBCUTANEOUS
  Filled 2021-05-08 (×2): qty 0.4

## 2021-05-08 NOTE — H&P (Signed)
History and Physical    Jim Mathis. WUJ:[id number] DOB: 01/12/1951 DOA: 05/07/2021  PCP: Jim Dark, MD   Patient coming from: Home   Chief Complaint: Abdominal pain, nausea, diarrhea   HPI: Jim Mathis. is a 70 y.o. male with medical history significant for peripheral arterial disease, hypertension, and partial colectomy in 2017 with colostomy reversal in 2018, presenting to the emergency department for evaluation of abdominal pain, nausea, and diarrhea.  Patient reports he been in his usual state of health until 4 AM on 05/07/2021 when he woke with severe pain and nausea.  He went on to have loose stools.  He has never experienced this previously.  Pain was severe and worse on the left side.  He also noted abdominal distention.  [facility] ED Course: Upon arrival to the ED, patient is found to be afebrile, saturating well on room air, and with stable blood pressure.  Chemistry panel and CBC are unremarkable.  Lipase was normal.  COVID-19 screening test negative.  CT abdomen/pelvis concerning for partial SBO involving the mid to distal jejunum felt to be related to adhesions.  Surgery was consulted by the ED physician, NG tube was placed, and the patient was given normal saline bolus, fentanyl, and Zofran before transfer to [facility].  Review of Systems:  All other systems reviewed and apart from HPI, are negative.  Past Medical History:  Diagnosis Date  . BPH with obstruction/lower urinary tract symptoms   . Carotid stenosis, right    per pt was told he has some stenosis but not enough for surgery;   in epic under media ,  carotid ultrasound result right ICA 50-69% and incidental finding thyroid nodule  . Cervicalgia   . ED (erectile dysfunction)   . History of colon resection    per pt 12 hours after partial knee arthroplasty 2017,  s/p partial colectomy was possible from divertiulitis  . Hyperlipidemia   . Hypertension   . Migraines   . PAD  (peripheral artery disease) (HCC) 08-14-2019  per pt when he walks (walks 2 miles a day) right foot gets numb but recovers quickly and when he rides his bike left hand gets numb by recovers quickly ;   denies claudication or swelling   per pt s/p  stenting to bilateral lower legs in [place] one in 2003 and the other 2007,  no follow up since moved to [facility]  . Wears glasses     Past Surgical History:  Procedure Laterality Date  . COLOSTOMY REVERSAL  02-21-2017     [place], [place] (OR record scanned in epic)   w/ sigmoid and descending colectomy with appendectomy  . CYSTOSCOPY WITH INJECTION N/A 12/19/2019   Procedure: CYSTOSCOPY WITH INJECTION;  Surgeon: Sebastian Ache, MD;  Location[facility];  Service: Urology;  Laterality: N/A;  . CYSTOSCOPY WITH URETHRAL DILATATION N/A 12/19/2019   Procedure: CYSTOSCOPY WITH URETHRAL DILATATION;  Surgeon: Sebastian Ache, MD;  Location: [facility];  Service: Urology;  Laterality: N/A;  45 MINS  . ENDOVASCULAR STENT INSERTION  2003 and 2007  --- both done in [place]   bilateral legs  . PARTIAL COLECTOMY  09-04-2016  in [place], [place]   w/  colostomy for perferated divierticulitis  . PARTIAL KNEE ARTHROPLASTY Left 2017  . TOTAL KNEE ARTHROPLASTY Right 2012  . TRANSURETHRAL RESECTION OF PROSTATE N/A 08/15/2019   Procedure: TRANSURETHRAL RESECTION OF THE PROSTATE (TURP);  Surgeon: Sebastian Ache, MD;  [facility];  Service: Urology;  Laterality: N/A;    Social History:   reports that he quit smoking about 26 years ago. His smoking use included cigarettes. He quit after 20.00 years of use. He has never used smokeless tobacco. He reports current alcohol use. He reports that he does not use drugs.  No Known Allergies  Family History  Problem Relation Age of Onset  . Alzheimer's disease Mother   . Lung cancer Father        non smoker     Prior to Admission medications   Medication Sig Start Date End  Date Taking? Authorizing Provider  amLODipine (NORVASC) 10 MG tablet Take 1 tablet (10 mg total) by mouth daily. 05/06/20   Jim Dark, MD  amoxicillin (AMOXIL) 500 MG capsule Take 1,000 mg by mouth 2 (two) times daily. 04/13/21   [provider]  aspirin EC 81 MG tablet Take 81 mg by mouth daily.    [provider]  atorvastatin (LIPITOR) 40 MG tablet Take 1 tablet (40 mg total) by mouth daily. 05/06/20   Jim Dark, MD  doxepin (SINEQUAN) 25 MG capsule Take 1 capsule (25 mg total) by mouth daily. 05/06/20   Jim Dark, MD  finasteride (PROSCAR) 5 MG tablet Take 1 tablet (5 mg total) by mouth at bedtime. 05/06/20   Jim Dark, MD  Naphazoline-Pheniramine (ALLERGY EYE OP) Apply to eye as needed.    [provider]  tadalafil (CIALIS) 20 MG tablet Take 1 tablet (20 mg total) by mouth daily. 03/27/21   Jim Dark, MD  tizanidine (ZANAFLEX) 2 MG capsule Take 1 capsule (2 mg total) by mouth 3 (three) times daily. Patient taking differently: Take 2 mg by mouth 3 (three) times daily as needed. As needed 12/17/20   Jim Dark, MD  triamcinolone ointment (KENALOG) 0.5 % Apply 1 application topically 3 (three) times daily. 10/13/20   Jim Dark, MD    Physical Exam: Vitals:   05/07/21 2200 05/07/21 2230 05/07/21 2332 05/08/21 0217  BP: (!) 155/81 (!) 143/78 (!) 154/77 (!) 141/89  Pulse: 80 78 80 75  Resp: 16 16 20 18   Temp: 98.2 F (36.8 C)   98.9 F (37.2 C)  TempSrc: Oral   Oral  SpO2: 100% 98% 100% 100%  Weight:    84 kg  Height:        Constitutional: NAD, calm  Eyes: PERTLA, lids and conjunctivae normal ENMT: Mucous membranes are moist. Posterior pharynx clear of any exudate or lesions.   Neck:  supple, no masses  Respiratory: no wheezing, no crackles. No accessory muscle use.  Cardiovascular: S1 & S2 heard, regular rate and rhythm. No extremity edema.   Abdomen: Soft, periumbilical tenderness without rebound pain or guarding.  Bowel sounds active.  Musculoskeletal: no clubbing / cyanosis. No joint deformity upper and lower extremities.   Skin: no significant rashes, lesions, ulcers. Warm, dry, well-perfused. Neurologic: CN 2-12 grossly intact. Sensation intact. Moving all extremities.  Psychiatric: Alert and oriented to person, place, and situation. Pleasant and cooperative.    Labs and Imaging on Admission: I have personally reviewed following labs and imaging studies  CBC: Recent Labs  Lab 05/07/21 2208  WBC 10.9*  NEUTROABS 8.6*  HGB 16.3  HCT 50.2  MCV 91.9  PLT 233   Basic Metabolic Panel: Recent Labs  Lab 05/07/21 2208  NA 138  K 4.0  CL 102  CO2 26  GLUCOSE 87  BUN 18  CREATININE 1.04  CALCIUM 9.6   GFR: Estimated Creatinine Clearance: 68.2 mL/min (by C-G formula based on SCr of 1.04 mg/dL). Liver Function Tests: Recent Labs  Lab 05/07/21 2208  AST 21  ALT 16  ALKPHOS 93  BILITOT 0.6  PROT 7.8  ALBUMIN 4.3   Recent Labs  Lab 05/07/21 2208  LIPASE 18   No results for input(s): AMMONIA in the last 168 hours. Coagulation Profile: No results for input(s): INR, PROTIME in the last 168 hours. Cardiac Enzymes: No results for input(s): CKTOTAL, CKMB, CKMBINDEX, TROPONINI in the last 168 hours. BNP (last 3 results) No results for input(s): PROBNP in the last 8760 hours. HbA1C: No results for input(s): HGBA1C in the last 72 hours. CBG: No results for input(s): GLUCAP in the last 168 hours. Lipid Profile: No results for input(s): CHOL, HDL, LDLCALC, TRIG, CHOLHDL, LDLDIRECT in the last 72 hours. Thyroid Function Tests: No results for input(s): TSH, T4TOTAL, FREET4, T3FREE, THYROIDAB in the last 72 hours. Anemia Panel: No results for input(s): VITAMINB12, FOLATE, FERRITIN, TIBC, IRON, RETICCTPCT in the last 72 hours. Urine analysis: No results found for: COLORURINE, APPEARANCEUR, LABSPEC, PHURINE, GLUCOSEU, HGBUR, BILIRUBINUR, KETONESUR, PROTEINUR, UROBILINOGEN, NITRITE,  LEUKOCYTESUR Sepsis Labs: @LABRCNTIP (procalcitonin:4,lacticidven:4) ) Recent Results (from the past 240 hour(s))  Resp Panel by RT-PCR (Flu A&B, Covid) Nasopharyngeal Swab     Status: None   Collection Time: 05/07/21 10:35 PM   Specimen: Nasopharyngeal Swab; Nasopharyngeal(NP) swabs in vial transport medium  Result Value Ref Range Status   SARS Coronavirus 2 by RT PCR NEGATIVE NEGATIVE Final    Comment: (NOTE) SARS-CoV-2 target nucleic acids are NOT DETECTED.  The SARS-CoV-2 RNA is generally detectable in upper respiratory specimens during the acute phase of infection. The lowest concentration of SARS-CoV-2 viral copies this assay can detect is 138 copies/mL. A negative result does not preclude SARS-Cov-2 infection and should not be used as the sole basis for treatment or other patient management decisions. A negative result may occur with  improper specimen collection/handling, submission of specimen other than nasopharyngeal swab, presence of viral mutation(s) within the areas targeted by this assay, and inadequate number of viral copies(<138 copies/mL). A negative result must be combined with clinical observations, patient history, and epidemiological information. The expected result is Negative.  Fact Sheet for Patients:  BloggerCourse.comhttps://www.fda.gov/media/152166/download  Fact Sheet for Healthcare Providers:  SeriousBroker.ithttps://www.fda.gov/media/152162/download  This test is no t yet approved or cleared by the [place] FDA and  has been authorized for detection and/or diagnosis of SARS-CoV-2 by FDA under an Emergency Use Authorization (EUA). This EUA will remain  in effect (meaning this test can be used) for the duration of the COVID-19 declaration under Section 564(b)(1) of the Act, 21 U.S.C.section 360bbb-3(b)(1), unless the authorization is terminated  or revoked sooner.       Influenza A by PCR NEGATIVE NEGATIVE Final   Influenza B by PCR NEGATIVE NEGATIVE Final    Comment:  (NOTE) The Xpert Xpress SARS-CoV-2/FLU/RSV plus assay is intended as an aid in the diagnosis of influenza from Nasopharyngeal swab specimens and should not be used as a sole basis for treatment. Nasal washings and aspirates are unacceptable for Xpert Xpress SARS-CoV-2/FLU/RSV testing.  Fact Sheet for Patients: BloggerCourse.comhttps://www.fda.gov/media/152166/download  Fact Sheet for Healthcare Providers: SeriousBroker.ithttps://www.fda.gov/media/152162/download  This test is not yet approved or cleared by the [place] FDA and has been authorized for detection and/or diagnosis of SARS-CoV-2 by FDA under an Emergency Use Authorization (EUA). This EUA will remain in effect (meaning this test  can be used) for the duration of the COVID-19 declaration under Section 564(b)(1) of the Act, 21 U.S.C. section 360bbb-3(b)(1), unless the authorization is terminated or revoked.  Performed at [facility], [address] [place], [place] [address]      Radiological Exams on Admission: CT ABDOMEN PELVIS WO CONTRAST  Result Date: 05/07/2021 CLINICAL DATA:  Nausea and abdominal pain for several hours EXAM: CT ABDOMEN AND PELVIS WITHOUT CONTRAST TECHNIQUE: Multidetector CT imaging of the abdomen and pelvis was performed following the standard protocol without IV contrast. COMPARISON:  None. FINDINGS: Lower chest: No acute abnormality. Mild herniation of fat is noted along the posterior diaphragm on the left. Hepatobiliary: No focal liver abnormality is seen. No gallstones, gallbladder wall thickening, or biliary dilatation. Pancreas: Unremarkable. No pancreatic ductal dilatation or surrounding inflammatory changes. Spleen: Normal in size without focal abnormality. Single calcified granuloma is noted. Adrenals/Urinary Tract: Adrenal glands are within normal limits. Kidneys are well visualize without renal calculi or obstructive changes. The bladder is decompressed. Stomach/Bowel: Colon is predominately  decompressed. The appendix is not well visualized and may have been surgically removed. The stomach is well distended with ingested food stuffs and fluid. Proximal jejunum demonstrates some mild distension which increases in the more distal jejunum. Mild inflammatory changes are noted surrounding the dilated loops of bowel. There is a transition point identified in the left mid abdomen best seen on image number 51 of series 2 and 77 of series 5. The more distal small bowel appears within normal limits. This is consistent with a partial small bowel obstruction likely related to adhesions from prior surgery. Vascular/Lymphatic: Aortic atherosclerosis. No enlarged abdominal or pelvic lymph nodes. Reproductive: Prostate is unremarkable. Other: No abdominal wall hernia or abnormality. No abdominopelvic ascites. Musculoskeletal: Degenerative changes of lumbar spine are noted. IMPRESSION: Changes consistent with partial small bowel obstruction in the mid to distal jejunum felt to be related to adhesions. More distal small bowel is within normal limits. No other acute abnormality is noted. Aortic Atherosclerosis (ICD10-I70.0). Electronically Signed   By: Alcide Clever M.D.   On: 05/07/2021 23:37   DG Abd Portable 1 View  Result Date: 05/08/2021 CLINICAL DATA:  Nasogastric tube placement EXAM: PORTABLE ABDOMEN - 1 VIEW COMPARISON:  None. FINDINGS: Nasogastric tube tip within the expected distal body of the stomach. Visualized abdominal gas pattern is unremarkable. Pelvis is excluded from view. Lung bases are clear. IMPRESSION: Nasogastric tube tip within the expected distal body of the stomach. Electronically Signed   By: Helyn Numbers MD   On: 05/08/2021 00:13    Assessment/Plan  1. SBO  - Patient with hx of perforated diverticulitis s/p partial colectomy in [place] in 2017 and colostomy reversal in 2018 presents with abdominal pain, nausea, and diarrhea and has CT findings suggestive of partial SBO likely due to  adhesions  - Surgery was consulted by ED physician, NGT was placed, and he was given IVF bolus - Continue NGT decompression, pain-control, maintenance IVF    2. Hypertension  - Treat as-needed only while NGT is in    3. PAD  - No acute ischemia, resume ASA and Lipitor when his condition allows     DVT prophylaxis: SCDs  Code Status: Full  Level of Care: Level of care: Med-Surg Family Communication: None present  Disposition Plan:  Patient is from: Home  Anticipated d/c is to: Home  Anticipated d/c date is: 05/10/21 Patient currently: Pending resolution of bowel obstruction  Consults called: Surgery  Admission status: Inpatient  Briscoe Deutscher, MD [facility]  05/08/2021, 4:14 AM

## 2021-05-08 NOTE — Progress Notes (Signed)
[facility] admissions notified of patient arrival. Dr.Opyd to admit patient.

## 2021-05-08 NOTE — Progress Notes (Signed)
Patient arrived to unit 6 North bed 19.Assisted patient to bed by nursing staff.Oriented patient to nursing unit and call bell system.No acute distress noted at present time.

## 2021-05-08 NOTE — Progress Notes (Signed)
PROGRESS NOTE    Jim Mathis.  WVP:[id number] DOB: 13-Dec-1951 DOA: 05/07/2021 PCP: Ardith Dark, MD    Chief Complaint  Patient presents with  . Abdominal Pain  . Nausea  . Diarrhea    Brief Narrative:                                         This is a no charge note as patient was seen earlier today by Dr. Antionette Char, chart, imaging labs were reviewed, patient was seen and examined.   HPI: Jim Mathis. is a 70 y.o. male with medical history significant for peripheral arterial disease, hypertension, and partial colectomy in 2017 with colostomy reversal in 2018, presenting to the emergency department for evaluation of abdominal pain, nausea, and diarrhea.  Patient reports he been in his usual state of health until 4 AM on 05/07/2021 when he woke with severe pain and nausea.  He went on to have loose stools.  He has never experienced this previously.  Pain was severe and worse on the left side.  He also noted abdominal distention.  [facility] ED Course: Upon arrival to the ED, patient is found to be afebrile, saturating well on room air, and with stable blood pressure.  Chemistry panel and CBC are unremarkable.  Lipase was normal.  COVID-19 screening test negative.  CT abdomen/pelvis concerning for partial SBO involving the mid to distal jejunum felt to be related to adhesions.  Surgery was consulted by the ED physician, NG tube was placed, and the patient was given normal saline bolus, fentanyl, and Zofran before transfer to [facility].  Assessment & Plan:   Principal Problem:   SBO (small bowel obstruction) (HCC) Active Problems:   Essential hypertension   PAD (peripheral artery disease) (HCC)    1. SBO  - Patient with hx of perforated diverticulitis s/p partial colectomy in [place] in 2017 and colostomy reversal in 2018 presents with abdominal pain, nausea, and diarrhea and has CT findings suggestive of partial SBO likely due to adhesions  -Management per  general surgery, continue with NGT on suction, continue with IV fluids, bowel rest,.  2. Hypertension  - Treat as-needed only while NGT is in    3. PAD  - No acute ischemia, resume ASA and Lipitor when his condition allows      DVT prophylaxis: Lovenox Code Status: Full Family Communication: None at bedside Disposition:   Status is: Inpatient  Remains inpatient appropriate because:IV treatments appropriate due to intensity of illness or inability to take PO   Dispo: The patient is from: Home              Anticipated d/c is to: Home              Patient currently is not medically stable to d/c.   Difficult to place patient No       Consultants:   General surgery   Subjective:  Reports was not able to pass any flatus or bowel movements overnight.  Objective: Vitals:   05/08/21 0217 05/08/21 0553 05/08/21 1015 05/08/21 1321  BP: (!) 141/89 134/79 132/60 135/60  Pulse: 75 76 79 85  Resp: 18 16 16 17   Temp: 98.9 F (37.2 C) 98.4 F (36.9 C) 98.3 F (36.8 C) 98 F (36.7 C)  TempSrc: Oral Oral Oral Oral  SpO2: 100% 96% 94% 95%  Weight: 84  kg     Height:        Intake/Output Summary (Last 24 hours) at 05/08/2021 1538 Last data filed at 05/08/2021 1205 Gross per 24 hour  Intake 500 ml  Output 1250 ml  Net -750 ml   Filed Weights   05/07/21 2158 05/08/21 0217  Weight: 82.6 kg 84 kg    Examination:  General exam: Appears calm and comfortable  Respiratory system: Clear to auscultation. Respiratory effort normal. Cardiovascular system: S1 & S2 heard, RRR. No JVD, murmurs, rubs, gallops or clicks. No pedal edema. Gastrointestinal system: Abdomen is mildly distended, increased bowel sounds. No organomegaly or masses felt. Normal bowel sounds heard. Central nervous system: Alert and oriented. No focal neurological deficits. Extremities: Symmetric 5 x 5 power. Skin: No rashes, lesions or ulcers Psychiatry: Judgement and insight appear normal. Mood & affect  appropriate.     Data Reviewed: I have personally reviewed following labs and imaging studies  CBC: Recent Labs  Lab 05/07/21 2208 05/08/21 0459  WBC 10.9* 10.4  NEUTROABS 8.6*  --   HGB 16.3 15.3  HCT 50.2 46.3  MCV 91.9 91.5  PLT 233 242    Basic Metabolic Panel: Recent Labs  Lab 05/07/21 2208 05/08/21 0459  NA 138 136  K 4.0 4.3  CL 102 103  CO2 26 25  GLUCOSE 87 113*  BUN 18 16  CREATININE 1.04 0.92  CALCIUM 9.6 9.5    GFR: Estimated Creatinine Clearance: 77.1 mL/min (by C-G formula based on SCr of 0.92 mg/dL).  Liver Function Tests: Recent Labs  Lab 05/07/21 2208  AST 21  ALT 16  ALKPHOS 93  BILITOT 0.6  PROT 7.8  ALBUMIN 4.3    CBG: No results for input(s): GLUCAP in the last 168 hours.   Recent Results (from the past 240 hour(s))  Resp Panel by RT-PCR (Flu A&B, Covid) Nasopharyngeal Swab     Status: None   Collection Time: 05/07/21 10:35 PM   Specimen: Nasopharyngeal Swab; Nasopharyngeal(NP) swabs in vial transport medium  Result Value Ref Range Status   SARS Coronavirus 2 by RT PCR NEGATIVE NEGATIVE Final    Comment: (NOTE) SARS-CoV-2 target nucleic acids are NOT DETECTED.  The SARS-CoV-2 RNA is generally detectable in upper respiratory specimens during the acute phase of infection. The lowest concentration of SARS-CoV-2 viral copies this assay can detect is 138 copies/mL. A negative result does not preclude SARS-Cov-2 infection and should not be used as the sole basis for treatment or other patient management decisions. A negative result may occur with  improper specimen collection/handling, submission of specimen other than nasopharyngeal swab, presence of viral mutation(s) within the areas targeted by this assay, and inadequate number of viral copies(<138 copies/mL). A negative result must be combined with clinical observations, patient history, and epidemiological information. The expected result is Negative.  Fact Sheet for  Patients:  BloggerCourse.com  Fact Sheet for Healthcare Providers:  SeriousBroker.it  This test is no t yet approved or cleared by the [place] FDA and  has been authorized for detection and/or diagnosis of SARS-CoV-2 by FDA under an Emergency Use Authorization (EUA). This EUA will remain  in effect (meaning this test can be used) for the duration of the COVID-19 declaration under Section 564(b)(1) of the Act, 21 U.S.C.section 360bbb-3(b)(1), unless the authorization is terminated  or revoked sooner.       Influenza A by PCR NEGATIVE NEGATIVE Final   Influenza B by PCR NEGATIVE NEGATIVE Final    Comment: (NOTE)  The Xpert Xpress SARS-CoV-2/FLU/RSV plus assay is intended as an aid in the diagnosis of influenza from Nasopharyngeal swab specimens and should not be used as a sole basis for treatment. Nasal washings and aspirates are unacceptable for Xpert Xpress SARS-CoV-2/FLU/RSV testing.  Fact Sheet for Patients: BloggerCourse.com  Fact Sheet for Healthcare Providers: SeriousBroker.it  This test is not yet approved or cleared by the [place] FDA and has been authorized for detection and/or diagnosis of SARS-CoV-2 by FDA under an Emergency Use Authorization (EUA). This EUA will remain in effect (meaning this test can be used) for the duration of the COVID-19 declaration under Section 564(b)(1) of the Act, 21 U.S.C. section 360bbb-3(b)(1), unless the authorization is terminated or revoked.  Performed at [facility], [address], [place], [place] [address]          Radiology Studies: CT ABDOMEN PELVIS WO CONTRAST  Result Date: 05/07/2021 CLINICAL DATA:  Nausea and abdominal pain for several hours EXAM: CT ABDOMEN AND PELVIS WITHOUT CONTRAST TECHNIQUE: Multidetector CT imaging of the abdomen and pelvis was performed following the standard  protocol without IV contrast. COMPARISON:  None. FINDINGS: Lower chest: No acute abnormality. Mild herniation of fat is noted along the posterior diaphragm on the left. Hepatobiliary: No focal liver abnormality is seen. No gallstones, gallbladder wall thickening, or biliary dilatation. Pancreas: Unremarkable. No pancreatic ductal dilatation or surrounding inflammatory changes. Spleen: Normal in size without focal abnormality. Single calcified granuloma is noted. Adrenals/Urinary Tract: Adrenal glands are within normal limits. Kidneys are well visualize without renal calculi or obstructive changes. The bladder is decompressed. Stomach/Bowel: Colon is predominately decompressed. The appendix is not well visualized and may have been surgically removed. The stomach is well distended with ingested food stuffs and fluid. Proximal jejunum demonstrates some mild distension which increases in the more distal jejunum. Mild inflammatory changes are noted surrounding the dilated loops of bowel. There is a transition point identified in the left mid abdomen best seen on image number 51 of series 2 and 77 of series 5. The more distal small bowel appears within normal limits. This is consistent with a partial small bowel obstruction likely related to adhesions from prior surgery. Vascular/Lymphatic: Aortic atherosclerosis. No enlarged abdominal or pelvic lymph nodes. Reproductive: Prostate is unremarkable. Other: No abdominal wall hernia or abnormality. No abdominopelvic ascites. Musculoskeletal: Degenerative changes of lumbar spine are noted. IMPRESSION: Changes consistent with partial small bowel obstruction in the mid to distal jejunum felt to be related to adhesions. More distal small bowel is within normal limits. No other acute abnormality is noted. Aortic Atherosclerosis (ICD10-I70.0). Electronically Signed   By: Alcide Clever M.D.   On: 05/07/2021 23:37   DG Abd Portable 1 View  Result Date: 05/08/2021 CLINICAL DATA:   Nasogastric tube placement EXAM: PORTABLE ABDOMEN - 1 VIEW COMPARISON:  None. FINDINGS: Nasogastric tube tip within the expected distal body of the stomach. Visualized abdominal gas pattern is unremarkable. Pelvis is excluded from view. Lung bases are clear. IMPRESSION: Nasogastric tube tip within the expected distal body of the stomach. Electronically Signed   By: Helyn Numbers MD   On: 05/08/2021 00:13        Scheduled Meds: Continuous Infusions: . lactated ringers 85 mL/hr at 05/08/21 1442     LOS: 0 days     Huey Bienenstock, MD [facility]   To contact the attending provider between 7A-7P or the covering provider during after hours 7P-7A, please log into the web site [email or web address] and access using  universal [place] password for that web site. If you do not have the password, please call the hospital operator.  05/08/2021, 3:38 PM

## 2021-05-08 NOTE — Progress Notes (Signed)
Called radiology and notified them that 8 hour delay would be 1930 for the xray.

## 2021-05-08 NOTE — ED Notes (Signed)
XRAY at Bedside.  

## 2021-05-08 NOTE — Plan of Care (Signed)
Pt laying in bed, family at bedside, tolerating NG tube well.   Problem: Education: Goal: Knowledge of General Education information will improve Description: Including pain rating scale, medication(s)/side effects and non-pharmacologic comfort measures Outcome: Progressing   Problem: Health Behavior/Discharge Planning: Goal: Ability to manage health-related needs will improve Outcome: Progressing   Problem: Clinical Measurements: Goal: Ability to maintain clinical measurements within normal limits will improve Outcome: Progressing Goal: Will remain free from infection Outcome: Progressing Goal: Diagnostic test results will improve Outcome: Progressing Goal: Respiratory complications will improve Outcome: Progressing   Problem: Activity: Goal: Risk for activity intolerance will decrease Outcome: Progressing   Problem: Coping: Goal: Level of anxiety will decrease Outcome: Progressing   Problem: Elimination: Goal: Will not experience complications related to bowel motility Outcome: Progressing   Problem: Pain Managment: Goal: General experience of comfort will improve Outcome: Progressing   Problem: Safety: Goal: Ability to remain free from injury will improve Outcome: Progressing   Problem: Skin Integrity: Goal: Risk for impaired skin integrity will decrease Outcome: Progressing

## 2021-05-08 NOTE — Consult Note (Signed)
[facility] Consult Note  Jim Mathis. [birth year]  [id number].    Requesting MD: Huey Bienenstock Chief Complaint/Reason for Consult: SBO.  HPI:  Jim Mathis. is a 70yo male hx perforated diverticulitis s/p partial colectomy/colostomy in [place] 2017 followed by colostomy reversal, who was transferred from [facility] to [facility] last night with SBO. Patient states that yesterday morning he developed diffuse abdominal pain and diarrhea. He had one episode of n/v.  Due to worsening pain he went to the ED. He underwent CT scan which shows partial small bowel obstruction in the mid to distal jejunum felt to be related to adhesions. States that he has never had a small bowel obstruction before. He feels about the same this morning with bloating and diffuse, intermittent abdominal pain. No flatus or BM today.  Anticoagulants: none Nonsmoker Drinks alcohol occasionally Denies illicit drug use Employment: retired  Review of Systems  Constitutional: Negative.   Respiratory: Negative.   Cardiovascular: Negative.   Gastrointestinal: Positive for abdominal pain, constipation, diarrhea, nausea and vomiting.    All systems reviewed and otherwise negative except for as above  Family History  Problem Relation Age of Onset  . Alzheimer's disease Mother   . Lung cancer Father        non smoker    Past Medical History:  Diagnosis Date  . BPH with obstruction/lower urinary tract symptoms   . Carotid stenosis, right    per pt was told he has some stenosis but not enough for surgery;   in epic under media ,  carotid ultrasound result right ICA 50-69% and incidental finding thyroid nodule  . Cervicalgia   . ED (erectile dysfunction)   . History of colon resection    per pt 12 hours after partial knee arthroplasty 2017,  s/p partial colectomy was possible from divertiulitis  . Hyperlipidemia   . Hypertension   . Migraines   . PAD (peripheral artery disease) (HCC)  08-14-2019  per pt when he walks (walks 2 miles a day) right foot gets numb but recovers quickly and when he rides his bike left hand gets numb by recovers quickly ;   denies claudication or swelling   per pt s/p  stenting to bilateral lower legs in [place] one in 2003 and the other 2007,  no follow up since moved to [facility]  . Wears glasses     Past Surgical History:  Procedure Laterality Date  . COLOSTOMY REVERSAL  02-21-2017     [place], [place] (OR record scanned in epic)   w/ sigmoid and descending colectomy with appendectomy  . CYSTOSCOPY WITH INJECTION N/A 12/19/2019   Procedure: CYSTOSCOPY WITH INJECTION;  Surgeon: Sebastian Ache, MD;  Location: [facility];  Service: Urology;  Laterality: N/A;  . CYSTOSCOPY WITH URETHRAL DILATATION N/A 12/19/2019   Procedure: CYSTOSCOPY WITH URETHRAL DILATATION;  Surgeon: Sebastian Ache, MD;  Location: [facility];  Service: Urology;  Laterality: N/A;  45 MINS  . ENDOVASCULAR STENT INSERTION  2003 and 2007  --- both done in [place]   bilateral legs  . PARTIAL COLECTOMY  09-04-2016  in [place], [place]   w/  colostomy for perferated divierticulitis  . PARTIAL KNEE ARTHROPLASTY Left 2017  . TOTAL KNEE ARTHROPLASTY Right 2012  . TRANSURETHRAL RESECTION OF PROSTATE N/A 08/15/2019   Procedure: TRANSURETHRAL RESECTION OF THE PROSTATE (TURP);  Surgeon: Sebastian Ache, MD;  Location: [facility];  Service: Urology;  Laterality: N/A;    Social History:  reports  that he quit smoking about 26 years ago. His smoking use included cigarettes. He quit after 20.00 years of use. He has never used smokeless tobacco. He reports current alcohol use. He reports that he does not use drugs.  Allergies: No Known Allergies  Medications Prior to Admission  Medication Sig Dispense Refill  . amLODipine (NORVASC) 10 MG tablet Take 1 tablet (10 mg total) by mouth daily. 90 tablet 3  . amoxicillin (AMOXIL) 500 MG capsule Take 1,000 mg by mouth  2 (two) times daily.    Marland Kitchen. aspirin EC 81 MG tablet Take 81 mg by mouth daily.    Marland Kitchen. atorvastatin (LIPITOR) 40 MG tablet Take 1 tablet (40 mg total) by mouth daily. 90 tablet 3  . doxepin (SINEQUAN) 25 MG capsule Take 1 capsule (25 mg total) by mouth daily. 90 capsule 3  . finasteride (PROSCAR) 5 MG tablet Take 1 tablet (5 mg total) by mouth at bedtime. 90 tablet 3  . Naphazoline-Pheniramine (ALLERGY EYE OP) Apply to eye as needed.    . tadalafil (CIALIS) 20 MG tablet Take 1 tablet (20 mg total) by mouth daily. 90 tablet 3  . tizanidine (ZANAFLEX) 2 MG capsule Take 1 capsule (2 mg total) by mouth 3 (three) times daily. (Patient taking differently: Take 2 mg by mouth 3 (three) times daily as needed. As needed) 90 capsule 2  . triamcinolone ointment (KENALOG) 0.5 % Apply 1 application topically 3 (three) times daily. 30 g 3    Prior to Admission medications   Medication Sig Start Date End Date Taking? Authorizing Provider  amLODipine (NORVASC) 10 MG tablet Take 1 tablet (10 mg total) by mouth daily. 05/06/20   Ardith DarkParker, Caleb M, MD  amoxicillin (AMOXIL) 500 MG capsule Take 1,000 mg by mouth 2 (two) times daily. 04/13/21   [provider]  aspirin EC 81 MG tablet Take 81 mg by mouth daily.    [provider]  atorvastatin (LIPITOR) 40 MG tablet Take 1 tablet (40 mg total) by mouth daily. 05/06/20   Ardith DarkParker, Caleb M, MD  doxepin (SINEQUAN) 25 MG capsule Take 1 capsule (25 mg total) by mouth daily. 05/06/20   Ardith DarkParker, Caleb M, MD  finasteride (PROSCAR) 5 MG tablet Take 1 tablet (5 mg total) by mouth at bedtime. 05/06/20   Ardith DarkParker, Caleb M, MD  Naphazoline-Pheniramine (ALLERGY EYE OP) Apply to eye as needed.    [provider]  tadalafil (CIALIS) 20 MG tablet Take 1 tablet (20 mg total) by mouth daily. 03/27/21   Ardith DarkParker, Caleb M, MD  tizanidine (ZANAFLEX) 2 MG capsule Take 1 capsule (2 mg total) by mouth 3 (three) times daily. Patient taking differently: Take 2 mg by mouth 3 (three) times  daily as needed. As needed 12/17/20   Ardith DarkParker, Caleb M, MD  triamcinolone ointment (KENALOG) 0.5 % Apply 1 application topically 3 (three) times daily. 10/13/20   Ardith DarkParker, Caleb M, MD    Blood pressure 134/79, pulse 76, temperature 98.4 F (36.9 C), temperature source Oral, resp. rate 16, height 5\' 10"  (1.778 m), weight 84 kg, SpO2 96 %. Physical Exam: General: pleasant, WD/WN male who is laying in bed in NAD HEENT: head is normocephalic, atraumatic.  Sclera are noninjected.  Pupils equal and round.  Ears and nose without any masses or lesions.  Mouth is pink and moist. Dentition fair Heart: regular, rate, and rhythm.  Normal s1,s2. No obvious murmurs, gallops, or rubs noted.  Palpable pedal pulses bilaterally  Lungs: CTAB, no wheezes, rhonchi,  or rales noted.  Respiratory effort nonlabored Abd: well healed midline incision, soft, mild distension, +BS, no masses, hernias, or organomegaly MS: no BUE/BLE edema, calves soft and nontender Skin: warm and dry with no masses, lesions, or rashes Psych: A&Ox4 with an appropriate affect Neuro: cranial nerves grossly intact, equal strength in BUE/BLE bilaterally, normal speech, thought process intact  Results for orders placed or performed during the hospital encounter of 05/07/21 (from the past 48 hour(s))  Comprehensive metabolic panel     Status: None   Collection Time: 05/07/21 10:08 PM  Result Value Ref Range   Sodium 138 135 - 145 mmol/L   Potassium 4.0 3.5 - 5.1 mmol/L   Chloride 102 98 - 111 mmol/L   CO2 26 22 - 32 mmol/L   Glucose, Bld 87 70 - 99 mg/dL    Comment: Glucose reference range applies only to samples taken after fasting for at least 8 hours.   BUN 18 8 - 23 mg/dL   Creatinine, Ser 7.51 0.61 - 1.24 mg/dL   Calcium 9.6 8.9 - 70.0 mg/dL   Total Protein 7.8 6.5 - 8.1 g/dL   Albumin 4.3 3.5 - 5.0 g/dL   AST 21 15 - 41 U/L   ALT 16 0 - 44 U/L   Alkaline Phosphatase 93 38 - 126 U/L   Total Bilirubin 0.6 0.3 - 1.2 mg/dL   GFR,  Estimated >17 >49 mL/min    Comment: (NOTE) Calculated using the CKD-EPI Creatinine Equation (2021)    Anion gap 10 5 - 15    Comment: Performed at [facility], [address] [place], [place] [address]  Lipase, blood     Status: None   Collection Time: 05/07/21 10:08 PM  Result Value Ref Range   Lipase 18 11 - 51 U/L    Comment: Performed at [facility], [address] [place], [place] [address]  CBC with Differential     Status: Abnormal   Collection Time: 05/07/21 10:08 PM  Result Value Ref Range   WBC 10.9 (H) 4.0 - 10.5 K/uL   RBC 5.46 4.22 - 5.81 MIL/uL   Hemoglobin 16.3 13.0 - 17.0 g/dL   HCT 84.6 65.9 - 93.5 %   MCV 91.9 80.0 - 100.0 fL   MCH 29.9 26.0 - 34.0 pg   MCHC 32.5 30.0 - 36.0 g/dL   RDW 70.1 77.9 - 39.0 %   Platelets 233 150 - 400 K/uL   nRBC 0.0 0.0 - 0.2 %   Neutrophils Relative % 79 %   Neutro Abs 8.6 (H) 1.7 - 7.7 K/uL   Lymphocytes Relative 11 %   Lymphs Abs 1.2 0.7 - 4.0 K/uL   Monocytes Relative 8 %   Monocytes Absolute 0.9 0.1 - 1.0 K/uL   Eosinophils Relative 1 %   Eosinophils Absolute 0.2 0.0 - 0.5 K/uL   Basophils Relative 1 %   Basophils Absolute 0.1 0.0 - 0.1 K/uL   Immature Granulocytes 0 %   Abs Immature Granulocytes 0.03 0.00 - 0.07 K/uL    Comment: Performed at [facility], [address] [place], [place] [address]  Resp Panel by RT-PCR (Flu A&B, Covid) Nasopharyngeal Swab     Status: None   Collection Time: 05/07/21 10:35 PM   Specimen: Nasopharyngeal Swab; Nasopharyngeal(NP) swabs in vial transport medium  Result Value Ref Range   SARS Coronavirus 2 by RT PCR NEGATIVE NEGATIVE    Comment: (NOTE) SARS-CoV-2 target nucleic acids are NOT DETECTED.  The SARS-CoV-2 RNA is generally detectable in upper respiratory specimens during the acute phase of infection. The lowest concentration of SARS-CoV-2 viral copies this assay can detect is 138 copies/mL. A negative result does  not preclude SARS-Cov-2 infection and should not be used as the sole basis for treatment or other patient management decisions. A negative result may occur with  improper specimen collection/handling, submission of specimen other than nasopharyngeal swab, presence of viral mutation(s) within the areas targeted by this assay, and inadequate number of viral copies(<138 copies/mL). A negative result must be combined with clinical observations, patient history, and epidemiological information. The expected result is Negative.  Fact Sheet for Patients:  BloggerCourse.com  Fact Sheet for Healthcare Providers:  SeriousBroker.it  This test is no t yet approved or cleared by the [place] FDA and  has been authorized for detection and/or diagnosis of SARS-CoV-2 by FDA under an Emergency Use Authorization (EUA). This EUA will remain  in effect (meaning this test can be used) for the duration of the COVID-19 declaration under Section 564(b)(1) of the Act, 21 U.S.C.section 360bbb-3(b)(1), unless the authorization is terminated  or revoked sooner.       Influenza A by PCR NEGATIVE NEGATIVE   Influenza B by PCR NEGATIVE NEGATIVE    Comment: (NOTE) The Xpert Xpress SARS-CoV-2/FLU/RSV plus assay is intended as an aid in the diagnosis of influenza from Nasopharyngeal swab specimens and should not be used as a sole basis for treatment. Nasal washings and aspirates are unacceptable for Xpert Xpress SARS-CoV-2/FLU/RSV testing.  Fact Sheet for Patients: BloggerCourse.com  Fact Sheet for Healthcare Providers: SeriousBroker.it  This test is not yet approved or cleared by the [place] FDA and has been authorized for detection and/or diagnosis of SARS-CoV-2 by FDA under an Emergency Use Authorization (EUA). This EUA will remain in effect (meaning this test can be used) for the duration of  the COVID-19 declaration under Section 564(b)(1) of the Act, 21 U.S.C. section 360bbb-3(b)(1), unless the authorization is terminated or revoked.  Performed at [facility], [address] [place], [place] [address]   Basic metabolic panel     Status: Abnormal   Collection Time: 05/08/21  4:59 AM  Result Value Ref Range   Sodium 136 135 - 145 mmol/L   Potassium 4.3 3.5 - 5.1 mmol/L   Chloride 103 98 - 111 mmol/L   CO2 25 22 - 32 mmol/L   Glucose, Bld 113 (H) 70 - 99 mg/dL    Comment: Glucose reference range applies only to samples taken after fasting for at least 8 hours.   BUN 16 8 - 23 mg/dL   Creatinine, Ser 9.56 0.61 - 1.24 mg/dL   Calcium 9.5 8.9 - 38.7 mg/dL   GFR, Estimated >56 >43 mL/min    Comment: (NOTE) Calculated using the CKD-EPI Creatinine Equation (2021)    Anion gap 8 5 - 15    Comment: Performed at [facility], [address], [place], [place] [address]  CBC     Status: None   Collection Time: 05/08/21  4:59 AM  Result Value Ref Range   WBC 10.4 4.0 - 10.5 K/uL   RBC 5.06 4.22 - 5.81 MIL/uL   Hemoglobin 15.3 13.0 - 17.0 g/dL   HCT 88.4 16.6 - 06.3 %   MCV 91.5 80.0 - 100.0 fL   MCH 30.2 26.0 - 34.0 pg   MCHC 33.0 30.0 - 36.0 g/dL   RDW 01.6 01.0 - 93.2 %  Platelets 242 150 - 400 K/uL   nRBC 0.0 0.0 - 0.2 %    Comment: Performed at [facility], [address], [place], [place] [address]   CT ABDOMEN PELVIS WO CONTRAST  Result Date: 05/07/2021 CLINICAL DATA:  Nausea and abdominal pain for several hours EXAM: CT ABDOMEN AND PELVIS WITHOUT CONTRAST TECHNIQUE: Multidetector CT imaging of the abdomen and pelvis was performed following the standard protocol without IV contrast. COMPARISON:  None. FINDINGS: Lower chest: No acute abnormality. Mild herniation of fat is noted along the posterior diaphragm on the left. Hepatobiliary: No focal liver abnormality is seen. No gallstones, gallbladder wall thickening, or biliary  dilatation. Pancreas: Unremarkable. No pancreatic ductal dilatation or surrounding inflammatory changes. Spleen: Normal in size without focal abnormality. Single calcified granuloma is noted. Adrenals/Urinary Tract: Adrenal glands are within normal limits. Kidneys are well visualize without renal calculi or obstructive changes. The bladder is decompressed. Stomach/Bowel: Colon is predominately decompressed. The appendix is not well visualized and may have been surgically removed. The stomach is well distended with ingested food stuffs and fluid. Proximal jejunum demonstrates some mild distension which increases in the more distal jejunum. Mild inflammatory changes are noted surrounding the dilated loops of bowel. There is a transition point identified in the left mid abdomen best seen on image number 51 of series 2 and 77 of series 5. The more distal small bowel appears within normal limits. This is consistent with a partial small bowel obstruction likely related to adhesions from prior surgery. Vascular/Lymphatic: Aortic atherosclerosis. No enlarged abdominal or pelvic lymph nodes. Reproductive: Prostate is unremarkable. Other: No abdominal wall hernia or abnormality. No abdominopelvic ascites. Musculoskeletal: Degenerative changes of lumbar spine are noted. IMPRESSION: Changes consistent with partial small bowel obstruction in the mid to distal jejunum felt to be related to adhesions. More distal small bowel is within normal limits. No other acute abnormality is noted. Aortic Atherosclerosis (ICD10-I70.0). Electronically Signed   By: Alcide Clever M.D.   On: 05/07/2021 23:37   DG Abd Portable 1 View  Result Date: 05/08/2021 CLINICAL DATA:  Nasogastric tube placement EXAM: PORTABLE ABDOMEN - 1 VIEW COMPARISON:  None. FINDINGS: Nasogastric tube tip within the expected distal body of the stomach. Visualized abdominal gas pattern is unremarkable. Pelvis is excluded from view. Lung bases are clear. IMPRESSION:  Nasogastric tube tip within the expected distal body of the stomach. Electronically Signed   By: Helyn Numbers MD   On: 05/08/2021 00:13      Assessment/Plan HTN HLD PAD BPH  Partial SBO - PSH: partial colectomy/colostomy followed by colostomy reversal for perforated diverticulitis in [place] 2017 - CT scan 5/19 shows partial small bowel obstruction in the mid to distal jejunum felt to be related to adhesions - Agree with NG tube for decompression. Will start patient on SBO protocol. We will follow.  ID - none VTE - SCDs, ok for chemical DVT prophylaxis from surgical standpoint FEN - IVF, NPO/NGT to LIWS Foley - none Follow up - TBD  Franne Forts, [facility] 05/08/2021, 7:59 AM Please see Amion for pager number during day hours 7:00am-4:30pm

## 2021-05-08 NOTE — ED Notes (Signed)
Patient made aware of Transfer Process and signed Transfer Consent Form willingly. Visitor made aware of visitation hours at [facility]. Visitor verbalized understanding.

## 2021-05-09 LAB — BASIC METABOLIC PANEL
Anion gap: 7 (ref 5–15)
BUN: 19 mg/dL (ref 8–23)
CO2: 24 mmol/L (ref 22–32)
Calcium: 8.8 mg/dL — ABNORMAL LOW (ref 8.9–10.3)
Chloride: 107 mmol/L (ref 98–111)
Creatinine, Ser: 1.12 mg/dL (ref 0.61–1.24)
GFR, Estimated: >60 mL/min (ref >60)
Glucose, Bld: 85 mg/dL (ref 70–99)
Potassium: 4.2 mmol/L (ref 3.5–5.1)
Sodium: 138 mmol/L (ref 135–145)

## 2021-05-09 LAB — CBC
HCT: 43 % (ref 39.0–52.0)
Hemoglobin: 14 g/dL (ref 13.0–17.0)
MCH: 30.3 pg (ref 26.0–34.0)
MCHC: 32.6 g/dL (ref 30.0–36.0)
MCV: 93.1 fL (ref 80.0–100.0)
Platelets: 211 10*3/uL (ref 150–400)
RBC: 4.62 MIL/uL (ref 4.22–5.81)
RDW: 13.2 % (ref 11.5–15.5)
WBC: 10.6 10*3/uL — ABNORMAL HIGH (ref 4.0–10.5)
nRBC: 0 % (ref 0.0–0.2)

## 2021-05-09 MED ORDER — AMLODIPINE BESYLATE 10 MG PO TABS
10.0000 mg | ORAL_TABLET | Freq: Every day | ORAL | Status: DC
  Administered 2021-05-09 – 2021-05-10 (×2): 10 mg via ORAL
  Filled 2021-05-09 (×2): qty 1

## 2021-05-09 MED ORDER — FINASTERIDE 5 MG PO TABS
5.0000 mg | ORAL_TABLET | Freq: Every day | ORAL | Status: DC
  Administered 2021-05-09: 5 mg via ORAL
  Filled 2021-05-09: qty 1

## 2021-05-09 NOTE — Progress Notes (Signed)
SBO (small bowel obstruction) (HCC)  Subjective: Pt having BM's.  Contrast in colon on AXR  Objective: Vital signs in last 24 hours: Temp:  [98 F (36.7 C)-100.7 F (38.2 C)] 98.4 F (36.9 C) (05/21 0456) Pulse Rate:  [79-99] 85 (05/21 0456) Resp:  [16-18] 18 (05/21 0456) BP: (132-150)/(60-86) 140/77 (05/21 0456) SpO2:  [94 %-96 %] 96 % (05/21 0456) Last BM Date: 05/09/21  Intake/Output from previous day: 05/20 0701 - 05/21 0700 In: 0  Out: 1250 [Emesis/NG output:1250] Intake/Output this shift: No intake/output data recorded.  General appearance: alert and cooperative GI: normal findings: soft, non-tender  Lab Results:  Results for orders placed or performed during the hospital encounter of 05/07/21 (from the past 24 hour(s))  Basic metabolic panel     Status: Abnormal   Collection Time: 05/09/21  1:52 AM  Result Value Ref Range   Sodium 138 135 - 145 mmol/L   Potassium 4.2 3.5 - 5.1 mmol/L   Chloride 107 98 - 111 mmol/L   CO2 24 22 - 32 mmol/L   Glucose, Bld 85 70 - 99 mg/dL   BUN 19 8 - 23 mg/dL   Creatinine, Ser 6.96 0.61 - 1.24 mg/dL   Calcium 8.8 (L) 8.9 - 10.3 mg/dL   GFR, Estimated >29 >52 mL/min   Anion gap 7 5 - 15  CBC     Status: Abnormal   Collection Time: 05/09/21  1:52 AM  Result Value Ref Range   WBC 10.6 (H) 4.0 - 10.5 K/uL   RBC 4.62 4.22 - 5.81 MIL/uL   Hemoglobin 14.0 13.0 - 17.0 g/dL   HCT 84.1 32.4 - 40.1 %   MCV 93.1 80.0 - 100.0 fL   MCH 30.3 26.0 - 34.0 pg   MCHC 32.6 30.0 - 36.0 g/dL   RDW 02.7 25.3 - 66.4 %   Platelets 211 150 - 400 K/uL   nRBC 0.0 0.0 - 0.2 %     Studies/Results Radiology     MEDS, Scheduled . enoxaparin (LOVENOX) injection  40 mg Subcutaneous Q24H     Assessment: SBO (small bowel obstruction) (HCC) resolving  Plan: D/c NG and start clears Advance diet as tolerated Ambulate Possible d.c tom am  LOS: 1 day    Vanita Panda, MD [facility], [place]    05/09/2021 9:01  AM

## 2021-05-09 NOTE — Progress Notes (Signed)
PROGRESS NOTE    Jim Mathis.  JWJ:[id number] DOB: 10/03/1951 DOA: 05/07/2021 PCP: Ardith Dark, MD    Chief Complaint  Patient presents with  . Abdominal Pain  . Nausea  . Diarrhea    Brief Narrative:   Jim Mena. is a 70 y.o. male with medical history significant for peripheral arterial disease, hypertension, and partial colectomy in 2017 with colostomy reversal in 2018, presenting to the emergency department for evaluation of abdominal pain, nausea, and diarrhea.  Patient reports he been in his usual state of health until 4 AM on 05/07/2021 when he woke with severe pain and nausea.  He went on to have loose stools.  He has never experienced this previously.  Pain was severe and worse on the left side.  He also noted abdominal distention.   CT abdomen/pelvis concerning for partial SBO involving the mid to distal jejunum felt to be related to adhesions.  Surgery was consulted , he was kept on NGT, had improvement of symptoms this morning.    Assessment & Plan:   Principal Problem:   SBO (small bowel obstruction) (HCC) Active Problems:   Essential hypertension   PAD (peripheral artery disease) (HCC)   SBO  - Patient with hx of perforated diverticulitis s/p partial colectomy in [place] in 2017 and colostomy reversal in 2018 presents with abdominal pain, nausea, and diarrhea and has CT findings suggestive of partial SBO likely due to adhesions  -Management per general surgery, he was not NGT tube initially, n.p.o., he had multiple bowel movements overnight, good bowel sounds today, chest x-ray improving obstruction, NG tube to be discontinued today and to start on clear liquid diet.  Continue with NGT on suction, continue with IV fluids, bowel rest,.  Hypertension  - Resume back on home Norvasc  PAD  - No acute ischemia, resume ASA and Lipitor when his condition allows    BPH -Continue with finasteride    DVT prophylaxis: Lovenox Code Status: Full Family  Communication: wife  at bedside Disposition:   Status is: Inpatient  Remains inpatient appropriate because:IV treatments appropriate due to intensity of illness or inability to take PO   Dispo: The patient is from: Home              Anticipated d/c is to: Home              Patient currently is not medically stable to d/c.   Difficult to place patient No       Consultants:   General surgery   Subjective:  Patient reports he had multiple bowel movements yesterday, watery, feels less bloated, denies any abdominal pain today.  Objective: Vitals:   05/08/21 1015 05/08/21 1321 05/08/21 2035 05/09/21 0456  BP: 132/60 135/60 (!) 150/86 140/77  Pulse: 79 85 99 85  Resp: 16 17 18 18   Temp: 98.3 F (36.8 C) 98 F (36.7 C) (!) 100.7 F (38.2 C) 98.4 F (36.9 C)  TempSrc: Oral Oral Oral Oral  SpO2: 94% 95% 96% 96%  Weight:      Height:        Intake/Output Summary (Last 24 hours) at 05/09/2021 1106 Last data filed at 05/09/2021 0830 Gross per 24 hour  Intake 180 ml  Output 1250 ml  Net -1070 ml   Filed Weights   05/07/21 2158 05/08/21 0217  Weight: 82.6 kg 84 kg    Examination:  Awake Alert, Oriented X 3, No new F.N deficits, Normal affect Symmetrical Chest wall movement, Good  air movement bilaterally, CTAB RRR,No Gallops,Rubs or new Murmurs, No Parasternal Heave +ve B.Sounds, Abd Soft, No tenderness, No rebound - guarding or rigidity. No Cyanosis, Clubbing or edema, No new Rash or bruise       Data Reviewed: I have personally reviewed following labs and imaging studies  CBC: Recent Labs  Lab 05/07/21 2208 05/08/21 0459 05/09/21 0152  WBC 10.9* 10.4 10.6*  NEUTROABS 8.6*  --   --   HGB 16.3 15.3 14.0  HCT 50.2 46.3 43.0  MCV 91.9 91.5 93.1  PLT 233 242 211    Basic Metabolic Panel: Recent Labs  Lab 05/07/21 2208 05/08/21 0459 05/09/21 0152  NA 138 136 138  K 4.0 4.3 4.2  CL 102 103 107  CO2 26 25 24   GLUCOSE 87 113* 85  BUN 18 16 19    CREATININE 1.04 0.92 1.12  CALCIUM 9.6 9.5 8.8*    GFR: Estimated Creatinine Clearance: 63.4 mL/min (by C-G formula based on SCr of 1.12 mg/dL).  Liver Function Tests: Recent Labs  Lab 05/07/21 2208  AST 21  ALT 16  ALKPHOS 93  BILITOT 0.6  PROT 7.8  ALBUMIN 4.3    CBG: No results for input(s): GLUCAP in the last 168 hours.   Recent Results (from the past 240 hour(s))  Resp Panel by RT-PCR (Flu A&B, Covid) Nasopharyngeal Swab     Status: None   Collection Time: 05/07/21 10:35 PM   Specimen: Nasopharyngeal Swab; Nasopharyngeal(NP) swabs in vial transport medium  Result Value Ref Range Status   SARS Coronavirus 2 by RT PCR NEGATIVE NEGATIVE Final    Comment: (NOTE) SARS-CoV-2 target nucleic acids are NOT DETECTED.  The SARS-CoV-2 RNA is generally detectable in upper respiratory specimens during the acute phase of infection. The lowest concentration of SARS-CoV-2 viral copies this assay can detect is 138 copies/mL. A negative result does not preclude SARS-Cov-2 infection and should not be used as the sole basis for treatment or other patient management decisions. A negative result may occur with  improper specimen collection/handling, submission of specimen other than nasopharyngeal swab, presence of viral mutation(s) within the areas targeted by this assay, and inadequate number of viral copies(<138 copies/mL). A negative result must be combined with clinical observations, patient history, and epidemiological information. The expected result is Negative.  Fact Sheet for Patients:  2209  Fact Sheet for Healthcare Providers:  05/09/21  This test is no t yet approved or cleared by the [employer] FDA and  has been authorized for detection and/or diagnosis of SARS-CoV-2 by FDA under an Emergency Use Authorization (EUA). This EUA will remain  in effect (meaning this test can be used) for the  duration of the COVID-19 declaration under Section 564(b)(1) of the Act, 21 U.S.C.section 360bbb-3(b)(1), unless the authorization is terminated  or revoked sooner.       Influenza A by PCR NEGATIVE NEGATIVE Final   Influenza B by PCR NEGATIVE NEGATIVE Final    Comment: (NOTE) The Xpert Xpress SARS-CoV-2/FLU/RSV plus assay is intended as an aid in the diagnosis of influenza from Nasopharyngeal swab specimens and should not be used as a sole basis for treatment. Nasal washings and aspirates are unacceptable for Xpert Xpress SARS-CoV-2/FLU/RSV testing.  Fact Sheet for Patients: [employer]  Fact Sheet for Healthcare Providers: [place]  This test is not yet approved or cleared by the [employer] FDA and has been authorized for detection and/or diagnosis of SARS-CoV-2 by FDA under an Emergency Use Authorization (EUA). This EUA will  remain in effect (meaning this test can be used) for the duration of the COVID-19 declaration under Section 564(b)(1) of the Act, 21 U.S.C. section 360bbb-3(b)(1), unless the authorization is terminated or revoked.  Performed at [facility], [address], [place], [place] [address]          Radiology Studies: CT ABDOMEN PELVIS WO CONTRAST  Result Date: 05/07/2021 CLINICAL DATA:  Nausea and abdominal pain for several hours EXAM: CT ABDOMEN AND PELVIS WITHOUT CONTRAST TECHNIQUE: Multidetector CT imaging of the abdomen and pelvis was performed following the standard protocol without IV contrast. COMPARISON:  None. FINDINGS: Lower chest: No acute abnormality. Mild herniation of fat is noted along the posterior diaphragm on the left. Hepatobiliary: No focal liver abnormality is seen. No gallstones, gallbladder wall thickening, or biliary dilatation. Pancreas: Unremarkable. No pancreatic ductal dilatation or surrounding inflammatory changes. Spleen: Normal in size  without focal abnormality. Single calcified granuloma is noted. Adrenals/Urinary Tract: Adrenal glands are within normal limits. Kidneys are well visualize without renal calculi or obstructive changes. The bladder is decompressed. Stomach/Bowel: Colon is predominately decompressed. The appendix is not well visualized and may have been surgically removed. The stomach is well distended with ingested food stuffs and fluid. Proximal jejunum demonstrates some mild distension which increases in the more distal jejunum. Mild inflammatory changes are noted surrounding the dilated loops of bowel. There is a transition point identified in the left mid abdomen best seen on image number 51 of series 2 and 77 of series 5. The more distal small bowel appears within normal limits. This is consistent with a partial small bowel obstruction likely related to adhesions from prior surgery. Vascular/Lymphatic: Aortic atherosclerosis. No enlarged abdominal or pelvic lymph nodes. Reproductive: Prostate is unremarkable. Other: No abdominal wall hernia or abnormality. No abdominopelvic ascites. Musculoskeletal: Degenerative changes of lumbar spine are noted. IMPRESSION: Changes consistent with partial small bowel obstruction in the mid to distal jejunum felt to be related to adhesions. More distal small bowel is within normal limits. No other acute abnormality is noted. Aortic Atherosclerosis (ICD10-I70.0). Electronically Signed   By: Alcide CleverMark  Lukens M.D.   On: 05/07/2021 23:37   DG Abd Portable 1V-Small Bowel Obstruction Protocol-initial, 8 hr delay  Result Date: 05/08/2021 CLINICAL DATA:  8 hour delay for small-bowel dilatation EXAM: PORTABLE ABDOMEN - 1 VIEW COMPARISON:  Film from earlier in the same day. FINDINGS: Previously administered contrast has passed throughout the small bowel into the colon is noted consistent with a partial small bowel obstruction. The degree of small-bowel dilatation has improved from the prior CT  examination. IMPRESSION: Passage of contrast material through the small bowel into the colon consistent with a partial small bowel obstruction. The degree of small-bowel dilatation has improved. Electronically Signed   By: Alcide CleverMark  Lukens M.D.   On: 05/08/2021 20:35   DG Abd Portable 1 View  Result Date: 05/08/2021 CLINICAL DATA:  Nasogastric tube placement EXAM: PORTABLE ABDOMEN - 1 VIEW COMPARISON:  None. FINDINGS: Nasogastric tube tip within the expected distal body of the stomach. Visualized abdominal gas pattern is unremarkable. Pelvis is excluded from view. Lung bases are clear. IMPRESSION: Nasogastric tube tip within the expected distal body of the stomach. Electronically Signed   By: Helyn NumbersAshesh  Parikh MD   On: 05/08/2021 00:13        Scheduled Meds: . enoxaparin (LOVENOX) injection  40 mg Subcutaneous Q24H   Continuous Infusions:    LOS: 1 day     Huey Bienenstockawood Taiten Brawn, MD [facility]  To contact the attending provider between 7A-7P or the covering provider during after hours 7P-7A, please log into the web site [email or web address] and access using universal [place] password for that web site. If you do not have the password, please call the hospital operator.  05/09/2021, 11:06 AM

## 2021-05-09 NOTE — Plan of Care (Signed)
  Problem: Education: Goal: Knowledge of General Education information will improve Description: Including pain rating scale, medication(s)/side effects and non-pharmacologic comfort measures Outcome: Progressing   Problem: Health Behavior/Discharge Planning: Goal: Ability to manage health-related needs will improve Outcome: Progressing   Problem: Clinical Measurements: Goal: Ability to maintain clinical measurements within normal limits will improve Outcome: Progressing Goal: Will remain free from infection Outcome: Progressing Goal: Diagnostic test results will improve Outcome: Progressing Goal: Respiratory complications will improve Outcome: Progressing   Problem: Activity: Goal: Risk for activity intolerance will decrease Outcome: Progressing   Problem: Coping: Goal: Level of anxiety will decrease Outcome: Progressing   Problem: Elimination: Goal: Will not experience complications related to bowel motility Outcome: Progressing   Problem: Pain Managment: Goal: General experience of comfort will improve Outcome: Progressing   Problem: Safety: Goal: Ability to remain free from injury will improve Outcome: Progressing   Problem: Skin Integrity: Goal: Risk for impaired skin integrity will decrease Outcome: Progressing

## 2021-05-10 LAB — CBC
HCT: 41 % (ref 39.0–52.0)
Hemoglobin: 13.3 g/dL (ref 13.0–17.0)
MCH: 30.2 pg (ref 26.0–34.0)
MCHC: 32.4 g/dL (ref 30.0–36.0)
MCV: 93 fL (ref 80.0–100.0)
Platelets: 193 10*3/uL (ref 150–400)
RBC: 4.41 MIL/uL (ref 4.22–5.81)
RDW: 12.9 % (ref 11.5–15.5)
WBC: 8.3 10*3/uL (ref 4.0–10.5)
nRBC: 0 % (ref 0.0–0.2)

## 2021-05-10 LAB — BASIC METABOLIC PANEL
Anion gap: 8 (ref 5–15)
BUN: 15 mg/dL (ref 8–23)
CO2: 25 mmol/L (ref 22–32)
Calcium: 8.5 mg/dL — ABNORMAL LOW (ref 8.9–10.3)
Chloride: 103 mmol/L (ref 98–111)
Creatinine, Ser: 0.92 mg/dL (ref 0.61–1.24)
GFR, Estimated: >60 mL/min (ref >60)
Glucose, Bld: 82 mg/dL (ref 70–99)
Potassium: 4 mmol/L (ref 3.5–5.1)
Sodium: 136 mmol/L (ref 135–145)

## 2021-05-10 MED ORDER — ASPIRIN EC 81 MG PO TBEC: 81.0000 mg | DELAYED_RELEASE_TABLET | Freq: Every day | ORAL | 11 refills | Status: DC

## 2021-05-10 NOTE — Discharge Instructions (Signed)
Follow with Primary MD Ardith Dark, MD in 7 days   Get CBC, CMP,checked  by Primary MD next visit.    Activity: As tolerated with Full fall precautions use walker/cane & assistance as needed   Disposition Home   Diet: SOFT Diet, please increase your fluid intake.  On your next visit with your primary care physician please Get Medicines reviewed and adjusted.   Please request your Prim.MD to go over all Hospital Tests and Procedure/Radiological results at the follow up, please get all Hospital records sent to your Prim MD by signing hospital release before you go home.   If you experience worsening of your admission symptoms, develop shortness of breath, life threatening emergency, suicidal or homicidal thoughts you must seek medical attention immediately by calling 911 or calling your MD immediately  if symptoms less severe.  You Must read complete instructions/literature along with all the possible adverse reactions/side effects for all the Medicines you take and that have been prescribed to you. Take any new Medicines after you have completely understood and accpet all the possible adverse reactions/side effects.   Do not drive, operating heavy machinery, perform activities at heights, swimming or participation in water activities or provide baby sitting services if your were admitted for syncope or siezures until you have seen by Primary MD or a Neurologist and advised to do so again.  Do not drive when taking Pain medications.    Do not take more than prescribed Pain, Sleep and Anxiety Medications  Special Instructions: If you have smoked or chewed Tobacco  in the last 2 yrs please stop smoking, stop any regular Alcohol  and or any Recreational drug use.  Wear Seat belts while driving.   Please note  You were cared for by a hospitalist during your hospital stay. If you have any questions about your discharge medications or the care you received while you were in the  hospital after you are discharged, you can call the unit and asked to speak with the hospitalist on call if the hospitalist that took care of you is not available. Once you are discharged, your primary care physician will handle any further medical issues. Please note that NO REFILLS for any discharge medications will be authorized once you are discharged, as it is imperative that you return to your primary care physician (or establish a relationship with a primary care physician if you do not have one) for your aftercare needs so that they can reassess your need for medications and monitor your lab values.

## 2021-05-10 NOTE — Plan of Care (Signed)
  Problem: Education: Goal: Knowledge of General Education information will improve Description: Including pain rating scale, medication(s)/side effects and non-pharmacologic comfort measures Outcome: Adequate for Discharge   Problem: Health Behavior/Discharge Planning: Goal: Ability to manage health-related needs will improve Outcome: Adequate for Discharge   Problem: Clinical Measurements: Goal: Ability to maintain clinical measurements within normal limits will improve Outcome: Adequate for Discharge Goal: Will remain free from infection Outcome: Adequate for Discharge Goal: Diagnostic test results will improve Outcome: Adequate for Discharge Goal: Respiratory complications will improve Outcome: Adequate for Discharge   Problem: Activity: Goal: Risk for activity intolerance will decrease Outcome: Adequate for Discharge   Problem: Coping: Goal: Level of anxiety will decrease Outcome: Adequate for Discharge   Problem: Elimination: Goal: Will not experience complications related to bowel motility Outcome: Adequate for Discharge   Problem: Pain Managment: Goal: General experience of comfort will improve Outcome: Adequate for Discharge   Problem: Safety: Goal: Ability to remain free from injury will improve Outcome: Adequate for Discharge   Problem: Skin Integrity: Goal: Risk for impaired skin integrity will decrease Outcome: Adequate for Discharge

## 2021-05-10 NOTE — Discharge Summary (Signed)
Physician Discharge Summary  Pryor MontesEdward Guggisberg Jr. NWG:[id number] DOB: 10/24/1951 DOA: 05/07/2021  PCP: Ardith DarkParker, Caleb M, MD  Admit date: 05/07/2021 Discharge date: 05/10/2021  Admitted From: Home Disposition:  Home   Recommendations for Outpatient Follow-up:  1. Follow up with PCP in 1-2 weeks 2. Please obtain BMP/CBC in one week  Home Health:NO Equipment/Devices:No  Discharge Condition:Stable CODE STATUS:FULL, Diet recommendation: Soft   Brief/Interim Summary:   SBO -Patient with hx of perforated diverticulitis s/p partial colectomy in [place] in 2017 and colostomy reversal in 2018 presents with abdominal pain, nausea, and diarrhea and has CT findings suggestive of partial SBO likely due to adhesions -General surgery were consulted, initially was conservative management, NG tube, suction, n.p.o., on IV fluids, difficult improvement of symptoms by day 2, NG tube was discontinued yesterday, he started to clear liquid diet, passing flatus and multiple watery bowel movements overnight, he was advanced to soft diet as breakfast and lunch, tolerated very well, he will be discharged home today with instructions to increase his fluid intake, and to continue with soft diet and advance as tolerated, to follow with general surgery as needed as an outpatient.     Hypertension -Resume back on home Norvasc  PAD -No acute ischemia, resume ASA and Lipitor   BPH -Continue with finasteride    Discharge Diagnoses:  Principal Problem:   SBO (small bowel obstruction) (HCC) Active Problems:   Essential hypertension   PAD (peripheral artery disease) [facility](HCC)    Discharge Instructions  Discharge Instructions    Discharge instructions   Complete by: As directed    Follow with Primary MD Ardith DarkParker, Caleb M, MD in 7 days   Get CBC, CMP,checked  by Primary MD next visit.    Activity: As tolerated with Full fall precautions use walker/cane & assistance as needed   Disposition  Home   Diet: SOFT Diet, please increase your fluid intake.  On your next visit with your primary care physician please Get Medicines reviewed and adjusted.   Please request your Prim.MD to go over all Hospital Tests and Procedure/Radiological results at the follow up, please get all Hospital records sent to your Prim MD by signing hospital release before you go home.   If you experience worsening of your admission symptoms, develop shortness of breath, life threatening emergency, suicidal or homicidal thoughts you must seek medical attention immediately by calling 911 or calling your MD immediately  if symptoms less severe.  You Must read complete instructions/literature along with all the possible adverse reactions/side effects for all the Medicines you take and that have been prescribed to you. Take any new Medicines after you have completely understood and accpet all the possible adverse reactions/side effects.   Do not drive, operating heavy machinery, perform activities at heights, swimming or participation in water activities or provide baby sitting services if your were admitted for syncope or siezures until you have seen by Primary MD or a Neurologist and advised to do so again.  Do not drive when taking Pain medications.    Do not take more than prescribed Pain, Sleep and Anxiety Medications  Special Instructions: If you have smoked or chewed Tobacco  in the last 2 yrs please stop smoking, stop any regular Alcohol  and or any Recreational drug use.  Wear Seat belts while driving.   Please note  You were cared for by a hospitalist during your hospital stay. If you have any questions about your discharge medications or the care you received while you were  in the hospital after you are discharged, you can call the unit and asked to speak with the hospitalist on call if the hospitalist that took care of you is not available. Once you are discharged, your primary care physician will  handle any further medical issues. Please note that NO REFILLS for any discharge medications will be authorized once you are discharged, as it is imperative that you return to your primary care physician (or establish a relationship with a primary care physician if you do not have one) for your aftercare needs so that they can reassess your need for medications and monitor your lab values.   Increase activity slowly   Complete by: As directed      Allergies as of 05/10/2021   No Known Allergies     Medication List    STOP taking these medications   amoxicillin 500 MG capsule Commonly known as: AMOXIL     TAKE these medications   ALLERGY EYE OP Place 1 drop into both eyes daily as needed.   amLODipine 10 MG tablet Commonly known as: NORVASC Take 1 tablet (10 mg total) by mouth daily.   aspirin EC 81 MG tablet Take 1 tablet (81 mg total) by mouth daily. Start taking on: May 17, 2021 What changed: These instructions start on May 17, 2021. If you are unsure what to do until then, ask your doctor or other care provider.   atorvastatin 40 MG tablet Commonly known as: LIPITOR Take 1 tablet (40 mg total) by mouth daily.   doxepin 25 MG capsule Commonly known as: SINEQUAN Take 1 capsule (25 mg total) by mouth daily.   finasteride 5 MG tablet Commonly known as: PROSCAR Take 1 tablet (5 mg total) by mouth at bedtime.   tadalafil 20 MG tablet Commonly known as: CIALIS Take 1 tablet (20 mg total) by mouth daily.   tizanidine 2 MG capsule Commonly known as: ZANAFLEX Take 1 capsule (2 mg total) by mouth 3 (three) times daily. What changed:   when to take this  reasons to take this   traMADol 50 MG tablet Commonly known as: ULTRAM Take 50 mg by mouth every 4 (four) hours as needed for moderate pain.   triamcinolone ointment 0.5 % Commonly known as: KENALOG Apply 1 application topically 3 (three) times daily. What changed:   when to take this  reasons to take this        Follow-up Information    Ardith Dark, MD Follow up in 1 week(s).   Specialty: Family Medicine Contact information: [address] [place] [address] [phone]              No Known Allergies  Consultations:  General Surgery   Procedures/Studies: CT ABDOMEN PELVIS WO CONTRAST  Result Date: 05/07/2021 CLINICAL DATA:  Nausea and abdominal pain for several hours EXAM: CT ABDOMEN AND PELVIS WITHOUT CONTRAST TECHNIQUE: Multidetector CT imaging of the abdomen and pelvis was performed following the standard protocol without IV contrast. COMPARISON:  None. FINDINGS: Lower chest: No acute abnormality. Mild herniation of fat is noted along the posterior diaphragm on the left. Hepatobiliary: No focal liver abnormality is seen. No gallstones, gallbladder wall thickening, or biliary dilatation. Pancreas: Unremarkable. No pancreatic ductal dilatation or surrounding inflammatory changes. Spleen: Normal in size without focal abnormality. Single calcified granuloma is noted. Adrenals/Urinary Tract: Adrenal glands are within normal limits. Kidneys are well visualize without renal calculi or obstructive changes. The bladder is decompressed. Stomach/Bowel: Colon is predominately decompressed. The  appendix is not well visualized and may have been surgically removed. The stomach is well distended with ingested food stuffs and fluid. Proximal jejunum demonstrates some mild distension which increases in the more distal jejunum. Mild inflammatory changes are noted surrounding the dilated loops of bowel. There is a transition point identified in the left mid abdomen best seen on image number 51 of series 2 and 77 of series 5. The more distal small bowel appears within normal limits. This is consistent with a partial small bowel obstruction likely related to adhesions from prior surgery. Vascular/Lymphatic: Aortic atherosclerosis. No enlarged abdominal or pelvic lymph nodes. Reproductive: Prostate is  unremarkable. Other: No abdominal wall hernia or abnormality. No abdominopelvic ascites. Musculoskeletal: Degenerative changes of lumbar spine are noted. IMPRESSION: Changes consistent with partial small bowel obstruction in the mid to distal jejunum felt to be related to adhesions. More distal small bowel is within normal limits. No other acute abnormality is noted. Aortic Atherosclerosis (ICD10-I70.0). Electronically Signed   By: Alcide Clever M.D.   On: 05/07/2021 23:37   DG Abd Portable 1V-Small Bowel Obstruction Protocol-initial, 8 hr delay  Result Date: 05/08/2021 CLINICAL DATA:  8 hour delay for small-bowel dilatation EXAM: PORTABLE ABDOMEN - 1 VIEW COMPARISON:  Film from earlier in the same day. FINDINGS: Previously administered contrast has passed throughout the small bowel into the colon is noted consistent with a partial small bowel obstruction. The degree of small-bowel dilatation has improved from the prior CT examination. IMPRESSION: Passage of contrast material through the small bowel into the colon consistent with a partial small bowel obstruction. The degree of small-bowel dilatation has improved. Electronically Signed   By: Alcide Clever M.D.   On: 05/08/2021 20:35   DG Abd Portable 1 View  Result Date: 05/08/2021 CLINICAL DATA:  Nasogastric tube placement EXAM: PORTABLE ABDOMEN - 1 VIEW COMPARISON:  None. FINDINGS: Nasogastric tube tip within the expected distal body of the stomach. Visualized abdominal gas pattern is unremarkable. Pelvis is excluded from view. Lung bases are clear. IMPRESSION: Nasogastric tube tip within the expected distal body of the stomach. Electronically Signed   By: Helyn Numbers MD   On: 05/08/2021 00:13      Subjective: Tolerated breakfast and lunch and soft diet very well, no nausea, no vomiting, no abdominal pain or distention, good bowel movements, passing flatus.  Discharge Exam: Vitals:   05/10/21 0500 05/10/21 1356  BP: 130/70 121/77  Pulse: 74 70   Resp: 17 16  Temp: 98 F (36.7 C) 97.8 F (36.6 C)  SpO2: 98% 97%   Vitals:   05/09/21 1304 05/09/21 2125 05/10/21 0500 05/10/21 1356  BP: (!) 141/76 125/73 130/70 121/77  Pulse: 84 70 74 70  Resp: 17 18 17 16   Temp: 98.1 F (36.7 C) 98.4 F (36.9 C) 98 F (36.7 C) 97.8 F (36.6 C)  TempSrc: Oral Oral Oral Oral  SpO2: 98% 97% 98% 97%  Weight:      Height:        General: Pt is alert, awake, not in acute distress Cardiovascular: RRR, S1/S2 +, no rubs, no gallops Respiratory: CTA bilaterally, no wheezing, no rhonchi Abdominal: Soft, NT, ND, bowel sounds + Extremities: no edema, no cyanosis    The results of significant diagnostics from this hospitalization (including imaging, microbiology, ancillary and laboratory) are listed below for reference.     Microbiology: Recent Results (from the past 240 hour(s))  Resp Panel by RT-PCR (Flu A&B, Covid) Nasopharyngeal Swab  Status: None   Collection Time: 05/07/21 10:35 PM   Specimen: Nasopharyngeal Swab; Nasopharyngeal(NP) swabs in vial transport medium  Result Value Ref Range Status   SARS Coronavirus 2 by RT PCR NEGATIVE NEGATIVE Final    Comment: (NOTE) SARS-CoV-2 target nucleic acids are NOT DETECTED.  The SARS-CoV-2 RNA is generally detectable in upper respiratory specimens during the acute phase of infection. The lowest concentration of SARS-CoV-2 viral copies this assay can detect is 138 copies/mL. A negative result does not preclude SARS-Cov-2 infection and should not be used as the sole basis for treatment or other patient management decisions. A negative result may occur with  improper specimen collection/handling, submission of specimen other than nasopharyngeal swab, presence of viral mutation(s) within the areas targeted by this assay, and inadequate number of viral copies(<138 copies/mL). A negative result must be combined with clinical observations, patient history, and epidemiological information. The  expected result is Negative.  Fact Sheet for Patients:  BloggerCourse.com  Fact Sheet for Healthcare Providers:  SeriousBroker.it  This test is no t yet approved or cleared by the [place] FDA and  has been authorized for detection and/or diagnosis of SARS-CoV-2 by FDA under an Emergency Use Authorization (EUA). This EUA will remain  in effect (meaning this test can be used) for the duration of the COVID-19 declaration under Section 564(b)(1) of the Act, 21 U.S.C.section 360bbb-3(b)(1), unless the authorization is terminated  or revoked sooner.       Influenza A by PCR NEGATIVE NEGATIVE Final   Influenza B by PCR NEGATIVE NEGATIVE Final    Comment: (NOTE) The Xpert Xpress SARS-CoV-2/FLU/RSV plus assay is intended as an aid in the diagnosis of influenza from Nasopharyngeal swab specimens and should not be used as a sole basis for treatment. Nasal washings and aspirates are unacceptable for Xpert Xpress SARS-CoV-2/FLU/RSV testing.  Fact Sheet for Patients: BloggerCourse.com  Fact Sheet for Healthcare Providers: SeriousBroker.it  This test is not yet approved or cleared by the [place] FDA and has been authorized for detection and/or diagnosis of SARS-CoV-2 by FDA under an Emergency Use Authorization (EUA). This EUA will remain in effect (meaning this test can be used) for the duration of the COVID-19 declaration under Section 564(b)(1) of the Act, 21 U.S.C. section 360bbb-3(b)(1), unless the authorization is terminated or revoked.  Performed at [facility], [address], [place], [place] [address]      Labs: BNP (last 3 results) No results for input(s): BNP in the last 8760 hours. Basic Metabolic Panel: Recent Labs  Lab 05/07/21 2208 05/08/21 0459 05/09/21 0152 05/10/21 0212  NA 138 136 138 136  K 4.0 4.3 4.2 4.0  CL 102 103 107 103   CO2 GLUCOSE 87 113* 85 82  BUN CREATININE 1.04 0.92 1.12 0.92  CALCIUM 9.6 9.5 8.8* 8.5*   Liver Function Tests: Recent Labs  Lab 05/07/21 2208  AST 21  ALT 16  ALKPHOS 93  BILITOT 0.6  PROT 7.8  ALBUMIN 4.3   Recent Labs  Lab 05/07/21 2208  LIPASE 18   No results for input(s): AMMONIA in the last 168 hours. CBC: Recent Labs  Lab 05/07/21 2208 05/08/21 0459 05/09/21 0152 05/10/21 0212  WBC 10.9* 10.4 10.6* 8.3  NEUTROABS 8.6*  --   --   --   HGB 16.3 15.3 14.0 13.3  HCT 50.2 46.3 43.0 41.0  MCV 91.9 91.5 93.1 93.0  PLT 233 242 211 193  Cardiac Enzymes: No results for input(s): CKTOTAL, CKMB, CKMBINDEX, TROPONINI in the last 168 hours. BNP: Invalid input(s): POCBNP CBG: No results for input(s): GLUCAP in the last 168 hours. D-Dimer No results for input(s): DDIMER in the last 72 hours. Hgb A1c No results for input(s): HGBA1C in the last 72 hours. Lipid Profile No results for input(s): CHOL, HDL, LDLCALC, TRIG, CHOLHDL, LDLDIRECT in the last 72 hours. Thyroid function studies No results for input(s): TSH, T4TOTAL, T3FREE, THYROIDAB in the last 72 hours.  Invalid input(s): FREET3 Anemia work up No results for input(s): VITAMINB12, FOLATE, FERRITIN, TIBC, IRON, RETICCTPCT in the last 72 hours. Urinalysis No results found for: COLORURINE, APPEARANCEUR, LABSPEC, PHURINE, GLUCOSEU, HGBUR, BILIRUBINUR, KETONESUR, PROTEINUR, UROBILINOGEN, NITRITE, LEUKOCYTESUR Sepsis Labs Invalid input(s): PROCALCITONIN,  WBC,  LACTICIDVEN Microbiology Recent Results (from the past 240 hour(s))  Resp Panel by RT-PCR (Flu A&B, Covid) Nasopharyngeal Swab     Status: None   Collection Time: 05/07/21 10:35 PM   Specimen: Nasopharyngeal Swab; Nasopharyngeal(NP) swabs in vial transport medium  Result Value Ref Range Status   SARS Coronavirus 2 by RT PCR NEGATIVE NEGATIVE Final    Comment: (NOTE) SARS-CoV-2 target nucleic acids are NOT DETECTED.  The  SARS-CoV-2 RNA is generally detectable in upper respiratory specimens during the acute phase of infection. The lowest concentration of SARS-CoV-2 viral copies this assay can detect is 138 copies/mL. A negative result does not preclude SARS-Cov-2 infection and should not be used as the sole basis for treatment or other patient management decisions. A negative result may occur with  improper specimen collection/handling, submission of specimen other than nasopharyngeal swab, presence of viral mutation(s) within the areas targeted by this assay, and inadequate number of viral copies(<138 copies/mL). A negative result must be combined with clinical observations, patient history, and epidemiological information. The expected result is Negative.  Fact Sheet for Patients:  BloggerCourse.com  Fact Sheet for Healthcare Providers:  SeriousBroker.it  This test is no t yet approved or cleared by the [place] FDA and  has been authorized for detection and/or diagnosis of SARS-CoV-2 by FDA under an Emergency Use Authorization (EUA). This EUA will remain  in effect (meaning this test can be used) for the duration of the COVID-19 declaration under Section 564(b)(1) of the Act, 21 U.S.C.section 360bbb-3(b)(1), unless the authorization is terminated  or revoked sooner.       Influenza A by PCR NEGATIVE NEGATIVE Final   Influenza B by PCR NEGATIVE NEGATIVE Final    Comment: (NOTE) The Xpert Xpress SARS-CoV-2/FLU/RSV plus assay is intended as an aid in the diagnosis of influenza from Nasopharyngeal swab specimens and should not be used as a sole basis for treatment. Nasal washings and aspirates are unacceptable for Xpert Xpress SARS-CoV-2/FLU/RSV testing.  Fact Sheet for Patients: BloggerCourse.com  Fact Sheet for Healthcare Providers: SeriousBroker.it  This test is not yet approved or  cleared by the [place] FDA and has been authorized for detection and/or diagnosis of SARS-CoV-2 by FDA under an Emergency Use Authorization (EUA). This EUA will remain in effect (meaning this test can be used) for the duration of the COVID-19 declaration under Section 564(b)(1) of the Act, 21 U.S.C. section 360bbb-3(b)(1), unless the authorization is terminated or revoked.  Performed at [facility], [address], [place], [place] [address]      Time coordinating discharge:  30 minutes  SIGNED:   Huey Bienenstock, MD  [facility] 05/10/2021, 3:25 PM Pager   If 7PM-7AM, please contact night-coverage [email or web address] Password  [email or web address]

## 2021-05-10 NOTE — Progress Notes (Signed)
SBO (small bowel obstruction) (HCC)  Subjective: Pt having BM's.  Tolerating a diet  Objective: Vital signs in last 24 hours: Temp:  [98 F (36.7 C)-98.4 F (36.9 C)] 98 F (36.7 C) (05/22 0500) Pulse Rate:  [70-84] 74 (05/22 0500) Resp:  [17-18] 17 (05/22 0500) BP: (125-141)/(70-76) 130/70 (05/22 0500) SpO2:  [97 %-98 %] 98 % (05/22 0500) Last BM Date: 05/09/21  Intake/Output from previous day: 05/21 0701 - 05/22 0700 In: 420 [P.O.:420] Out: -  Intake/Output this shift: Total I/O In: 300 [P.O.:300] Out: -   General appearance: alert and cooperative GI: normal findings: soft, non-tender  Lab Results:  Results for orders placed or performed during the hospital encounter of 05/07/21 (from the past 24 hour(s))  Basic metabolic panel     Status: Abnormal   Collection Time: 05/10/21  2:12 AM  Result Value Ref Range   Sodium 136 135 - 145 mmol/L   Potassium 4.0 3.5 - 5.1 mmol/L   Chloride 103 98 - 111 mmol/L   CO2 25 22 - 32 mmol/L   Glucose, Bld 82 70 - 99 mg/dL   BUN 15 8 - 23 mg/dL   Creatinine, Ser 4.40 0.61 - 1.24 mg/dL   Calcium 8.5 (L) 8.9 - 10.3 mg/dL   GFR, Estimated >34 >74 mL/min   Anion gap 8 5 - 15  CBC     Status: None   Collection Time: 05/10/21  2:12 AM  Result Value Ref Range   WBC 8.3 4.0 - 10.5 K/uL   RBC 4.41 4.22 - 5.81 MIL/uL   Hemoglobin 13.3 13.0 - 17.0 g/dL   HCT 25.9 56.3 - 87.5 %   MCV 93.0 80.0 - 100.0 fL   MCH 30.2 26.0 - 34.0 pg   MCHC 32.4 30.0 - 36.0 g/dL   RDW 64.3 32.9 - 51.8 %   Platelets 193 150 - 400 K/uL   nRBC 0.0 0.0 - 0.2 %     Studies/Results Radiology     MEDS, Scheduled . amLODipine  10 mg Oral Daily  . enoxaparin (LOVENOX) injection  40 mg Subcutaneous Q24H  . finasteride  5 mg Oral QHS     Assessment: SBO (small bowel obstruction) (HCC) resolving  Plan: Tolerating liquids and having bowel function.   Pt will try solid diet this am and is ok to d/c if tolerates this Follow up with [place] PRN  LOS: 2  days    Vanita Panda, MD [facility] Surgery, [place]    05/10/2021 10:36 AM

## 2021-05-12 ENCOUNTER — Telehealth: Payer: Self-pay

## 2021-05-12 NOTE — Telephone Encounter (Cosign Needed)
Transition Care Management Follow-up Telephone Call  Date of discharge and from where: 05/10/20  How have you been since you were released from the hospital? good  Any questions or concerns? No  Items Reviewed:  Did the pt receive and understand the discharge instructions provided? Yes   Medications obtained and verified? Yes   Other? No   Any new allergies since your discharge? No   Dietary orders reviewed? Yes  Do you have support at home? Yes   Home Care and Equipment/Supplies: Were home health services ordered? not applicable If so, what is the name of the agency?   Has the agency set up a time to come to the patient's home? not applicable Were any new equipment or medical supplies ordered?  No What is the name of the medical supply agency?  Were you able to get the supplies/equipment? not applicable Do you have any questions related to the use of the equipment or supplies? No  Functional Questionnaire: (I = Independent and D = Dependent) ADLs: I  Bathing/Dressing- I  Meal Prep- I  Eating- I  Maintaining continence- I  Transferring/Ambulation- I  Managing Meds- I  Follow up appointments reviewed:   PCP Hospital f/u appt confirmed? Yes  Scheduled to see Dr Jimmey Ralph  on 05/22/21 @ 11:40  Specialist Hospital f/u appt confirmed? No    Are transportation arrangements needed? No   If their condition worsens, is the pt aware to call PCP or go to the Emergency Dept.? Yes  Was the patient provided with contact information for the PCP's office or ED? Yes  Was to pt encouraged to call back with questions or concerns? Yes

## 2021-05-14 ENCOUNTER — Telehealth: Payer: Self-pay

## 2021-05-14 NOTE — Chronic Care Management (AMB) (Signed)
    Chronic Care Management Pharmacy Assistant   Name: Jim Mathis.  MRN: [id number] DOB: [birth year]  Reason for Encounter: General Adherence Call  Recent office visits:  No visits noted   Recent consult visits:  No Visits noted  Hospital visits:  Medication Reconciliation was completed by comparing discharge summary, patient's EMR and Pharmacy list, and upon discussion with patient.  Admitted to the hospital on 05/07/21 due to small bowel obstruction. Discharge date was 05/10/21. Discharged from [facility].   Stopped amoxicillin  Changes  to Medication-  aspirin EC 81 MG tablet start on 05/17/21   All other medications  remain the same after Hospital Discharge:??    Medications: Outpatient Encounter Medications as of 05/14/2021  Medication Sig  . amLODipine (NORVASC) 10 MG tablet Take 1 tablet (10 mg total) by mouth daily.  Melene Muller ON 05/17/2021] aspirin EC 81 MG tablet Take 1 tablet (81 mg total) by mouth daily.  Marland Kitchen atorvastatin (LIPITOR) 40 MG tablet Take 1 tablet (40 mg total) by mouth daily.  Marland Kitchen doxepin (SINEQUAN) 25 MG capsule Take 1 capsule (25 mg total) by mouth daily.  . finasteride (PROSCAR) 5 MG tablet Take 1 tablet (5 mg total) by mouth at bedtime.  March Rummage (ALLERGY EYE OP) Place 1 drop into both eyes daily as needed.  . tadalafil (CIALIS) 20 MG tablet Take 1 tablet (20 mg total) by mouth daily.  . tizanidine (ZANAFLEX) 2 MG capsule Take 1 capsule (2 mg total) by mouth 3 (three) times daily. (Patient taking differently: Take 2 mg by mouth 3 (three) times daily as needed for muscle spasms.)  . traMADol (ULTRAM) 50 MG tablet Take 50 mg by mouth every 4 (four) hours as needed for moderate pain.  Marland Kitchen triamcinolone ointment (KENALOG) 0.5 % Apply 1 application topically 3 (three) times daily. (Patient taking differently: Apply 1 application topically daily as needed (Skin rash).)   No facility-administered encounter medications on file  as of 05/14/2021.   I spoke with Jim Mathis and he has been doing well since getting out of the hospital. He stated that since his care in the ED, he has not been nauseous or in pain like he was. He has been following the directions given to him about adjusting his diet and it has been working well. He was previously taking asprin 81 daily, but he was instructed to hold this medication until his ED follow up with PCP. He has been doing so and reports no issues with this adjustment. Overall Jim Mathis has no complaints or concerns about any current issues.  Have you had any problems recently with your health?  Patient was recently in the hospital for bowel obstruction  Have you had any problems with your pharmacy? Patient stated he has not had any problems with his pharmacy   What issues or side effects are you having with your medications? Patient stated he doesn't have any issues with his medications  What would you like me to pass along to [facility] for them to help you with?  Patient stated he did not have anything to pass along   What can we do to take care of you better? Patient did not offer any suggestions  Star Rating Drugs: Atorvastatin 40 mg- 90 DS last filled 04/16/21  Purvis Kilts CPA, CMA

## 2021-05-22 ENCOUNTER — Other Ambulatory Visit: Payer: Self-pay

## 2021-05-22 ENCOUNTER — Encounter: Payer: Self-pay | Admitting: Family Medicine

## 2021-05-22 ENCOUNTER — Ambulatory Visit (INDEPENDENT_AMBULATORY_CARE_PROVIDER_SITE_OTHER): Payer: PPO | Admitting: Family Medicine

## 2021-05-22 VITALS — BP 134/79 | HR 68 | Temp 97.9°F | Ht 70.0 in | Wt 199.8 lb

## 2021-05-22 DIAGNOSIS — E785 Hyperlipidemia, unspecified: Secondary | ICD-10-CM | POA: Diagnosis not present

## 2021-05-22 DIAGNOSIS — K56609 Unspecified intestinal obstruction, unspecified as to partial versus complete obstruction: Secondary | ICD-10-CM

## 2021-05-22 DIAGNOSIS — Z8719 Personal history of other diseases of the digestive system: Secondary | ICD-10-CM

## 2021-05-22 DIAGNOSIS — N400 Enlarged prostate without lower urinary tract symptoms: Secondary | ICD-10-CM | POA: Diagnosis not present

## 2021-05-22 DIAGNOSIS — I10 Essential (primary) hypertension: Secondary | ICD-10-CM

## 2021-05-22 NOTE — Progress Notes (Signed)
Chief Complaint:  Jim Mathis. is a 70 y.o. male who presents today for a TCM visit.  Assessment/Plan:  Problems Addressed Today: SBO (small bowel obstruction) (HCC) Thankfully symptoms have resolved and not returned.  Discussed future warning signs and reasons to seek care.  Hopefully this will be a one-time occurrence.  Dyslipidemia On Lipitor 40 mg daily.  We will check labs at his next office visit.  Essential hypertension At goal on amlodipine 10 mg daily.  BPH with ED and OAB Stable on finasteride 5 mg daily and Cialis 20 mg daily.     Subjective:  HPI:  Summary of Hospital admission: Reason for admission: SBO Date of admission: 05/07/2021 Date of discharge: 05/10/2021 Date of Interactive contact: 05/12/2021 Summary of Hospital course: Patient presented to the ED with abdominal pain, nausea, vomiting.  CT scan showed partial SBO.  Surgery was consulted.  Admitted for conservative management.  NG tube was placed.  Symptoms improved on hospital day 2.  It was advanced to clear liquid diet then soft diet.  Tolerated well and discharged home.  Since being home he has done well.  No recurrence of symptoms.  No nausea or vomiting.  See A/P for status of chronic conditions   ROS: Per HPI, otherwise a complete review of systems was negative.   PMH:  The following were reviewed and entered/updated in epic: Past Medical History:  Diagnosis Date  . BPH with obstruction/lower urinary tract symptoms   . Carotid stenosis, right    per pt was told he has some stenosis but not enough for surgery;   in epic under media ,  carotid ultrasound result right ICA 50-69% and incidental finding thyroid nodule  . Cervicalgia   . ED (erectile dysfunction)   . History of colon resection    per pt 12 hours after partial knee arthroplasty 2017,  s/p partial colectomy was possible from divertiulitis  . Hyperlipidemia   . Hypertension   . Migraines   . PAD (peripheral artery disease)  (HCC) 08-14-2019  per pt when he walks (walks 2 miles a day) right foot gets numb but recovers quickly and when he rides his bike left hand gets numb by recovers quickly ;   denies claudication or swelling   per pt s/p  stenting to bilateral lower legs in [place] one in 2003 and the other 2007,  no follow up since moved to [facility]  . Wears glasses    Patient Active Problem List   Diagnosis Date Noted  . SBO (small bowel obstruction) (HCC) 05/08/2021  . PAD (peripheral artery disease) (HCC)   . Actinic keratosis 05/06/2020  . Nonintractable headache 08/22/2019  . BPH with ED and OAB 05/24/2019  . Essential hypertension 05/24/2019  . Dyslipidemia 05/24/2019  . Anxiety 05/24/2019  . Seborrheic dermatitis 05/24/2019  . Osteoarthritis 07/13/2012   Past Surgical History:  Procedure Laterality Date  . COLOSTOMY REVERSAL  02-21-2017     [place], [place] (OR record scanned in epic)   w/ sigmoid and descending colectomy with appendectomy  . CYSTOSCOPY WITH INJECTION N/A 12/19/2019   Procedure: CYSTOSCOPY WITH INJECTION;  Surgeon: Sebastian Ache, MD;  Location[facility];  Service: Urology;  Laterality: N/A;  . CYSTOSCOPY WITH URETHRAL DILATATION N/A 12/19/2019   Procedure: CYSTOSCOPY WITH URETHRAL DILATATION;  Surgeon: Sebastian Ache, MD;  Location: [facility];  Service: Urology;  Laterality: N/A;  45 MINS  . ENDOVASCULAR STENT INSERTION  2003 and 2007  --- both  done in [place]   bilateral legs  . PARTIAL COLECTOMY  09-04-2016  in [place], [place]   w/  colostomy for perferated divierticulitis  . PARTIAL KNEE ARTHROPLASTY Left 2017  . TOTAL KNEE ARTHROPLASTY Right 2012  . TRANSURETHRAL RESECTION OF PROSTATE N/A 08/15/2019   Procedure: TRANSURETHRAL RESECTION OF THE PROSTATE (TURP);  Surgeon: Sebastian Ache, MD;  Location: [facility];  Service: Urology;  Laterality: N/A;    Family History  Problem Relation Age of Onset  . Alzheimer's disease Mother   .  Lung cancer Father        non smoker    Medications- Reconciled discharge and current medications in Epic.  Current Outpatient Medications  Medication Sig Dispense Refill  . amLODipine (NORVASC) 10 MG tablet Take 1 tablet (10 mg total) by mouth daily. 90 tablet 3  . aspirin EC 81 MG tablet Take 1 tablet (81 mg total) by mouth daily. 30 tablet 11  . atorvastatin (LIPITOR) 40 MG tablet Take 1 tablet (40 mg total) by mouth daily. 90 tablet 3  . doxepin (SINEQUAN) 25 MG capsule Take 1 capsule (25 mg total) by mouth daily. 90 capsule 3  . finasteride (PROSCAR) 5 MG tablet Take 1 tablet (5 mg total) by mouth at bedtime. 90 tablet 3  . Naphazoline-Pheniramine (ALLERGY EYE OP) Place 1 drop into both eyes daily as needed.    . tadalafil (CIALIS) 20 MG tablet Take 1 tablet (20 mg total) by mouth daily. 90 tablet 3  . tizanidine (ZANAFLEX) 2 MG capsule Take 1 capsule (2 mg total) by mouth 3 (three) times daily. (Patient taking differently: Take 2 mg by mouth 3 (three) times daily as needed for muscle spasms.) 90 capsule 2  . triamcinolone ointment (KENALOG) 0.5 % Apply 1 application topically 3 (three) times daily. (Patient taking differently: Apply 1 application topically daily as needed (Skin rash).) 30 g 3   No current facility-administered medications for this visit.    Allergies-reviewed and updated No Known Allergies  Social History   Socioeconomic History  . Marital status: Married    Spouse name: Not on file  . Number of children: Not on file  . Years of education: Not on file  . Highest education level: Not on file  Occupational History  . Not on file  Tobacco Use  . Smoking status: Former Smoker    Years: 20.00    Types: Cigarettes    Quit date: 05/23/1994    Years since quitting: 27.0  . Smokeless tobacco: Never Used  Vaping Use  . Vaping Use: Never used  Substance and Sexual Activity  . Alcohol use: Yes    Comment: occasionally  . Drug use: Never  . Sexual activity: Yes   Other Topics Concern  . Not on file  Social History Narrative   Right handed   Lives in two story home with wife   Social Determinants of Health   Financial Resource Strain: Low Risk   . Difficulty of Paying Living Expenses: Not hard at all  Food Insecurity: No Food Insecurity  . Worried About Programme researcher, broadcasting/film/video in the Last Year: Never true  . Ran Out of Food in the Last Year: Never true  Transportation Needs: No Transportation Needs  . Lack of Transportation (Medical): No  . Lack of Transportation (Non-Medical): No  Physical Activity: Sufficiently Active  . Days of Exercise per Week: 5 days  . Minutes of Exercise per Session: 120 min  Stress: No Stress Concern  Present  . Feeling of Stress : Not at all  Social Connections: Socially Integrated  . Frequency of Communication with Friends and Family: Once a week  . Frequency of Social Gatherings with Friends and Family: Three times a week  . Attends Religious Services: 1 to 4 times per year  . Active Member of Clubs or Organizations: Yes  . Attends Banker Meetings: 1 to 4 times per year  . Marital Status: Married        Objective:  Physical Exam: BP 134/79   Pulse 68   Temp 97.9 F (36.6 C) (Temporal)   Ht 5\' 10"  (1.778 m)   Wt 199 lb 12.8 oz (90.6 kg)   SpO2 98%   BMI 28.67 kg/m   Gen: NAD, resting comfortably CV: RRR with no murmurs appreciated Pulm: NWOB, CTAB with no crackles, wheezes, or rhonchi GI: Normal bowel sounds present. Soft, Nontender, Nondistended. MSK: No edema, cyanosis, or clubbing noted Skin: Warm, dry Neuro: Grossly normal, moves all extremities Psych: Normal affect and thought content      Jim Mathis M. , MD 05/22/2021 12:26 PM

## 2021-05-22 NOTE — Assessment & Plan Note (Signed)
Thankfully symptoms have resolved and not returned.  Discussed future warning signs and reasons to seek care.  Hopefully this will be a one-time occurrence.

## 2021-05-22 NOTE — Patient Instructions (Signed)
It was very nice to see you today!  I am glad that you are feeling better.  We do not need to make any changes to your treatment plan at this time.  I will see you back soon for your annual visit with labs.  Come back to see me sooner if needed.  Take care, Dr Jimmey Ralph  PLEASE NOTE:  If you had any lab tests please let us know if you have not heard back within a few days. You may see your results on mychart before we have a chance to review them but we will give you a call once they are reviewed by [place]. If we ordered any referrals today, please let us know if you have not heard from their office within the next week.   Please try these tips to maintain a healthy lifestyle:   Eat at least 3 REAL meals and 1-2 snacks per day.  Aim for no more than 5 hours between eating.  If you eat breakfast, please do so within one hour of getting up.    Each meal should contain half fruits/vegetables, one quarter protein, and one quarter carbs (no bigger than a computer mouse)   Cut down on sweet beverages. This includes juice, soda, and sweet tea.     Drink at least 1 glass of water with each meal and aim for at least 8 glasses per day   Exercise at least 150 minutes every week.

## 2021-05-22 NOTE — Assessment & Plan Note (Signed)
Stable on finasteride 5 mg daily and Cialis 20 mg daily.

## 2021-05-22 NOTE — Assessment & Plan Note (Signed)
At goal on amlodipine 10 mg daily. 

## 2021-05-22 NOTE — Assessment & Plan Note (Signed)
On Lipitor 40 mg daily.  We will check labs at his next office visit.

## 2021-06-09 ENCOUNTER — Other Ambulatory Visit: Payer: Self-pay | Admitting: Family Medicine

## 2021-06-18 ENCOUNTER — Other Ambulatory Visit: Payer: Self-pay | Admitting: *Deleted

## 2021-06-18 MED ORDER — ATORVASTATIN CALCIUM 40 MG PO TABS: 40.0000 mg | ORAL_TABLET | Freq: Every day | ORAL | 3 refills | Status: DC

## 2021-06-23 ENCOUNTER — Telehealth: Payer: Self-pay

## 2021-06-23 NOTE — Chronic Care Management (AMB) (Signed)
    Chronic Care Management Pharmacy Assistant   Name: Mare Ludtke.  MRN: [id number] DOB: 02/08/1951  Reason for Encounter: General Adherence/ CPP Visit Schedule Call  Recent office visits:  05/22/21- Jacquiline Doe, MD - seen for TCM visit, no medication changes, follow up as scheduled   Recent consult visits:  No visits noted   Hospital visits:  None in previous 6 months  Medications: Outpatient Encounter Medications as of 06/23/2021  Medication Sig   amLODipine (NORVASC) 10 MG tablet Take 1 tablet by mouth daily   aspirin EC 81 MG tablet Take 1 tablet (81 mg total) by mouth daily.   atorvastatin (LIPITOR) 40 MG tablet Take 1 tablet (40 mg total) by mouth daily.   doxepin (SINEQUAN) 25 MG capsule Take 1 capsule by mouth daily   finasteride (PROSCAR) 5 MG tablet Take 1 tablet (5 mg total) by mouth at bedtime.   Naphazoline-Pheniramine (ALLERGY EYE OP) Place 1 drop into both eyes daily as needed.   tadalafil (CIALIS) 20 MG tablet Take 1 tablet (20 mg total) by mouth daily.   tizanidine (ZANAFLEX) 2 MG capsule Take 1 capsule (2 mg total) by mouth 3 (three) times daily. (Patient taking differently: Take 2 mg by mouth 3 (three) times daily as needed for muscle spasms.)   triamcinolone ointment (KENALOG) 0.5 % Apply 1 application topically 3 (three) times daily. (Patient taking differently: Apply 1 application topically daily as needed (Skin rash).)   No facility-administered encounter medications on file as of 06/23/2021.   I spoke with Mr. Renae Fickle this afternoon and he is doing well. He was in the woods at the time of our call, but was able to speak briefly. He stated that everything has been going well with [facility] order pharmacy and feels that it is unnecessary to receive constant CCM calls. He would like the amount of calls to be decreased if not completely ended. He agreed to contact CCM services if he needs anything and he would not like to schedule any further visits.   Star  Rating Drugs: Atorvastatin 40 mg- 90 DS last filled 04/16/21  Purvis Kilts CPA, CMA

## 2021-07-16 ENCOUNTER — Telehealth: Payer: Self-pay | Admitting: Pharmacist

## 2021-07-16 NOTE — Chronic Care Management (AMB) (Signed)
    Chronic Care Management Pharmacy Assistant   Name: Zan Orlick.  MRN: [id number] DOB: August 25, 1951  Reason for Encounter: Chart Review    Recent office visits:  None  Recent consult visits:  None  Hospital visits:  None in previous 6 months  Medications: Outpatient Encounter Medications as of 07/16/2021  Medication Sig   amLODipine (NORVASC) 10 MG tablet Take 1 tablet by mouth daily   aspirin EC 81 MG tablet Take 1 tablet (81 mg total) by mouth daily.   atorvastatin (LIPITOR) 40 MG tablet Take 1 tablet (40 mg total) by mouth daily.   doxepin (SINEQUAN) 25 MG capsule Take 1 capsule by mouth daily   finasteride (PROSCAR) 5 MG tablet Take 1 tablet (5 mg total) by mouth at bedtime.   Naphazoline-Pheniramine (ALLERGY EYE OP) Place 1 drop into both eyes daily as needed.   tadalafil (CIALIS) 20 MG tablet Take 1 tablet (20 mg total) by mouth daily.   tizanidine (ZANAFLEX) 2 MG capsule Take 1 capsule (2 mg total) by mouth 3 (three) times daily. (Patient taking differently: Take 2 mg by mouth 3 (three) times daily as needed for muscle spasms.)   triamcinolone ointment (KENALOG) 0.5 % Apply 1 application topically 3 (three) times daily. (Patient taking differently: Apply 1 application topically daily as needed (Skin rash).)   No facility-administered encounter medications on file as of 07/16/2021.   Reviewed chart for medication changes. No OVs, Consults, or hospital visits since last care coordination call/Pharmacist visit.  No medication changes indicated.  Unsuccessful attempt at reaching patient to schedule a follow up appointment with clinical pharmacist.  Future Appointments  Date Time Provider Department Center  08/28/2021  9:20 AM Ardith Dark, MD [facility]  04/26/2022  1:45 PM [facility]     [name], [facility] Clinical Pharmacist Assistant [phone]

## 2021-07-21 DIAGNOSIS — Z125 Encounter for screening for malignant neoplasm of prostate: Secondary | ICD-10-CM | POA: Diagnosis not present

## 2021-07-21 LAB — PSA: PSA: 0.48

## 2021-07-28 DIAGNOSIS — N35811 Other urethral stricture, male, meatal: Secondary | ICD-10-CM | POA: Diagnosis not present

## 2021-07-28 DIAGNOSIS — N5201 Erectile dysfunction due to arterial insufficiency: Secondary | ICD-10-CM | POA: Diagnosis not present

## 2021-07-28 DIAGNOSIS — R3912 Poor urinary stream: Secondary | ICD-10-CM | POA: Diagnosis not present

## 2021-07-30 ENCOUNTER — Encounter: Payer: Self-pay | Admitting: Family Medicine

## 2021-08-28 ENCOUNTER — Ambulatory Visit (INDEPENDENT_AMBULATORY_CARE_PROVIDER_SITE_OTHER): Payer: PPO | Admitting: Family Medicine

## 2021-08-28 ENCOUNTER — Encounter: Payer: Self-pay | Admitting: Family Medicine

## 2021-08-28 ENCOUNTER — Other Ambulatory Visit: Payer: Self-pay

## 2021-08-28 VITALS — BP 115/66 | HR 78 | Temp 98.3°F | Ht 70.0 in | Wt 185.8 lb

## 2021-08-28 DIAGNOSIS — I10 Essential (primary) hypertension: Secondary | ICD-10-CM | POA: Diagnosis not present

## 2021-08-28 DIAGNOSIS — F419 Anxiety disorder, unspecified: Secondary | ICD-10-CM

## 2021-08-28 DIAGNOSIS — Z0001 Encounter for general adult medical examination with abnormal findings: Secondary | ICD-10-CM

## 2021-08-28 DIAGNOSIS — N400 Enlarged prostate without lower urinary tract symptoms: Secondary | ICD-10-CM

## 2021-08-28 DIAGNOSIS — R739 Hyperglycemia, unspecified: Secondary | ICD-10-CM | POA: Diagnosis not present

## 2021-08-28 DIAGNOSIS — E785 Hyperlipidemia, unspecified: Secondary | ICD-10-CM

## 2021-08-28 LAB — CBC
HCT: 42.3 % (ref 39.0–52.0)
Hemoglobin: 13.9 g/dL (ref 13.0–17.0)
MCHC: 32.9 g/dL (ref 30.0–36.0)
MCV: 91.6 fl (ref 78.0–100.0)
Platelets: 210 10*3/uL (ref 150.0–400.0)
RBC: 4.62 Mil/uL (ref 4.22–5.81)
RDW: 14.1 % (ref 11.5–15.5)
WBC: 4.7 10*3/uL (ref 4.0–10.5)

## 2021-08-28 LAB — COMPREHENSIVE METABOLIC PANEL
ALT: 19 U/L (ref 0–53)
AST: 18 U/L (ref 0–37)
Albumin: 4 g/dL (ref 3.5–5.2)
Alkaline Phosphatase: 76 U/L (ref 39–117)
BUN: 17 mg/dL (ref 6–23)
CO2: 24 mEq/L (ref 19–32)
Calcium: 9.1 mg/dL (ref 8.4–10.5)
Chloride: 106 mEq/L (ref 96–112)
Creatinine, Ser: 0.92 mg/dL (ref 0.40–1.50)
GFR: 84.03 mL/min (ref >60.00)
Glucose, Bld: 107 mg/dL — ABNORMAL HIGH (ref 70–99)
Potassium: 4.7 mEq/L (ref 3.5–5.1)
Sodium: 138 mEq/L (ref 135–145)
Total Bilirubin: 0.4 mg/dL (ref 0.2–1.2)
Total Protein: 6.9 g/dL (ref 6.0–8.3)

## 2021-08-28 LAB — LIPID PANEL
Cholesterol: 161 mg/dL (ref 0–200)
HDL: 52.7 mg/dL (ref >39.00)
LDL Cholesterol: 88 mg/dL (ref 0–99)
NonHDL: 107.84
Total CHOL/HDL Ratio: 3
Triglycerides: 100 mg/dL (ref 0.0–149.0)
VLDL: 20 mg/dL (ref 0.0–40.0)

## 2021-08-28 LAB — TSH: TSH: 1.68 u[IU]/mL (ref 0.35–5.50)

## 2021-08-28 LAB — HEMOGLOBIN A1C: Hgb A1c MFr Bld: 5.8 % (ref 4.6–6.5)

## 2021-08-28 NOTE — Assessment & Plan Note (Signed)
Doing well on doxepin 25 mg daily.  Continue current dose.

## 2021-08-28 NOTE — Assessment & Plan Note (Signed)
Follows with urology-Dr. Berneice Heinrich.  He is stable on finasteride 5 mg daily and Cialis 20 mg daily.

## 2021-08-28 NOTE — Patient Instructions (Signed)
It was very nice to see you today!  I am glad you are doing well! Keep up the good work with your diet and exercise.  We will check blood work today.  I will see back in year for your next physical.  Come back to see me sooner if needed.  Take care, Dr Jimmey Ralph  PLEASE NOTE:  If you had any lab tests please let us know if you have not heard back within a few days. You may see your results on mychart before we have a chance to review them but we will give you a call once they are reviewed by [place]. If we ordered any referrals today, please let us know if you have not heard from their office within the next week.   Please try these tips to maintain a healthy lifestyle:  Eat at least 3 REAL meals and 1-2 snacks per day.  Aim for no more than 5 hours between eating.  If you eat breakfast, please do so within one hour of getting up.   Each meal should contain half fruits/vegetables, one quarter protein, and one quarter carbs (no bigger than a computer mouse)  Cut down on sweet beverages. This includes juice, soda, and sweet tea.   Drink at least 1 glass of water with each meal and aim for at least 8 glasses per day  Exercise at least 150 minutes every week.    Preventive Care 76 Years and Older, Male Preventive care refers to lifestyle choices and visits with your health care provider that can promote health and wellness. This includes: A yearly physical exam. This is also called an annual wellness visit. Regular dental and eye exams. Immunizations. Screening for certain conditions. Healthy lifestyle choices, such as: Eating a healthy diet. Getting regular exercise. Not using drugs or products that contain nicotine and tobacco. Limiting alcohol use. What can I expect for my preventive care visit? Physical exam Your health care provider will check your: Height and weight. These may be used to calculate your BMI (body mass index). BMI is a measurement that tells if you are at a healthy  weight. Heart rate and blood pressure. Body temperature. Skin for abnormal spots. Counseling Your health care provider may ask you questions about your: Past medical problems. Family's medical history. Alcohol, tobacco, and drug use. Emotional well-being. Home life and relationship well-being. Sexual activity. Diet, exercise, and sleep habits. History of falls. Memory and ability to understand (cognition). Work and work Astronomer. Access to firearms. What immunizations do I need? Vaccines are usually given at various ages, according to a schedule. Your health care provider will recommend vaccines for you based on your age, medical history, and lifestyle or other factors, such as travel or where you work. What tests do I need? Blood tests Lipid and cholesterol levels. These may be checked every 5 years, or more often depending on your overall health. Hepatitis C test. Hepatitis B test. Screening Lung cancer screening. You may have this screening every year starting at age 68 if you have a 30-pack-year history of smoking and currently smoke or have quit within the past 15 years. Colorectal cancer screening. All adults should have this screening starting at age 43 and continuing until age 20. Your health care provider may recommend screening at age 88 if you are at increased risk. You will have tests every 1-10 years, depending on your results and the type of screening test. Prostate cancer screening. Recommendations will vary depending on your family history  and other risks. Genital exam to check for testicular cancer or hernias. Diabetes screening. This is done by checking your blood sugar (glucose) after you have not eaten for a while (fasting). You may have this done every 1-3 years. Abdominal aortic aneurysm (AAA) screening. You may need this if you are a current or former smoker. STD (sexually transmitted disease) testing, if you are at risk. Follow these instructions at  home: Eating and drinking  Eat a diet that includes fresh fruits and vegetables, whole grains, lean protein, and low-fat dairy products. Limit your intake of foods with high amounts of sugar, saturated fats, and salt. Take vitamin and mineral supplements as recommended by your health care provider. Do not drink alcohol if your health care provider tells you not to drink. If you drink alcohol: Limit how much you have to 0-2 drinks a day. Be aware of how much alcohol is in your drink. In the [place] one drink equals one 12 oz bottle of beer (355 mL), one 5 oz glass of wine (148 mL), or one 1 oz glass of hard liquor (44 mL). Lifestyle Take daily care of your teeth and gums. Brush your teeth every morning and night with fluoride toothpaste. Floss one time each day. Stay active. Exercise for at least 30 minutes 5 or more days each week. Do not use any products that contain nicotine or tobacco, such as cigarettes, e-cigarettes, and chewing tobacco. If you need help quitting, ask your health care provider. Do not use drugs. If you are sexually active, practice safe sex. Use a condom or other form of protection to prevent STIs (sexually transmitted infections). Talk with your health care provider about taking a low-dose aspirin or statin. Find healthy ways to cope with stress, such as: Meditation, yoga, or listening to music. Journaling. Talking to a trusted person. Spending time with friends and family. Safety Always wear your seat belt while driving or riding in a vehicle. Do not drive: If you have been drinking alcohol. Do not ride with someone who has been drinking. When you are tired or distracted. While texting. Wear a helmet and other protective equipment during sports activities. If you have firearms in your house, make sure you follow all gun safety procedures. What's next? Visit your health care provider once a year for an annual wellness visit. Ask your health care provider how often  you should have your eyes and teeth checked. Stay up to date on all vaccines. This information is not intended to replace advice given to you by your health care provider. Make sure you discuss any questions you have with your health care provider. Document Revised: 02/13/2021 Document Reviewed: 11/30/2018 Elsevier Patient Education  2022 [employer].

## 2021-08-28 NOTE — Progress Notes (Signed)
Chief Complaint:  Jim Mathis. is a 70 y.o. male who presents today for his annual comprehensive physical exam.    Assessment/Plan:  Chronic Problems Addressed Today: Anxiety Doing well on doxepin 25 mg daily.  Continue current dose.  Dyslipidemia Check labs.  Continue Lipitor 40 mg daily.  Essential hypertension At goal on amlodipine 10 mg daily.  BPH with ED and OAB Follows with urology-Dr. Berneice Heinrich.  He is stable on finasteride 5 mg daily and Cialis 20 mg daily.  Body mass index is 26.66 kg/m. / Overweight  BMI Metric Follow Up - 08/28/21 1004       BMI Metric Follow Up-Please document annually   BMI Metric Follow Up Education provided              Preventative Healthcare: Check Labs. UTD on vaccines and screenings.   Patient Counseling(The following topics were reviewed and/or handout was given):  -Nutrition: Stressed importance of moderation in sodium/caffeine intake, saturated fat and cholesterol, caloric balance, sufficient intake of fresh fruits, vegetables, and fiber.  -Stressed the importance of regular exercise.   -Substance Abuse: Discussed cessation/primary prevention of tobacco, alcohol, or other drug use; driving or other dangerous activities under the influence; availability of treatment for abuse.   -Injury prevention: Discussed safety belts, safety helmets, smoke detector, smoking near bedding or upholstery.   -Sexuality: Discussed sexually transmitted diseases, partner selection, use of condoms, avoidance of unintended pregnancy and contraceptive alternatives.   -Dental health: Discussed importance of regular tooth brushing, flossing, and dental visits.  -Health maintenance and immunizations reviewed. Please refer to Health maintenance section.  Return to care in 1 year for next preventative visit.     Subjective:  HPI:  He has no acute complaints today.   Lifestyle Diet: Balanced.  Exercise: Is part of a hiking club, does  bicycling  Depression screen PHQ 2/9 08/28/2021  Decreased Interest 0  Down, Depressed, Hopeless 0  PHQ - 2 Score 0  Altered sleeping -  Tired, decreased energy -  Change in appetite -  Feeling bad or failure about yourself  -  Trouble concentrating -  Moving slowly or fidgety/restless -  Suicidal thoughts -  PHQ-9 Score -  Difficult doing work/chores -    Health Maintenance Due  Topic Date Due   Hepatitis C Screening  Never done   COVID-19 Vaccine (5 - Booster for Pfizer series) 08/25/2021     ROS: Per HPI, otherwise a complete review of systems was negative.   PMH:  The following were reviewed and entered/updated in epic: Past Medical History:  Diagnosis Date   BPH with obstruction/lower urinary tract symptoms    Carotid stenosis, right    per pt was told he has some stenosis but not enough for surgery;   in epic under media ,  carotid ultrasound result right ICA 50-69% and incidental finding thyroid nodule   Cervicalgia    ED (erectile dysfunction)    History of colon resection    per pt 12 hours after partial knee arthroplasty 2017,  s/p partial colectomy was possible from divertiulitis   Hyperlipidemia    Hypertension    Migraines    PAD (peripheral artery disease) (HCC) 08-14-2019  per pt when he walks (walks 2 miles a day) right foot gets numb but recovers quickly and when he rides his bike left hand gets numb by recovers quickly ;   denies claudication or swelling   per pt s/p  stenting to bilateral lower legs in  [place] one in 2003 and the other 2007,  no follow up since moved to [facility]   Wears glasses    Patient Active Problem List   Diagnosis Date Noted   SBO (small bowel obstruction) (HCC) 05/08/2021   PAD (peripheral artery disease) (HCC)    Actinic keratosis 05/06/2020   Nonintractable headache 08/22/2019   BPH with ED and OAB 05/24/2019   Essential hypertension 05/24/2019   Dyslipidemia 05/24/2019   Anxiety 05/24/2019   Seborrheic dermatitis 05/24/2019    Osteoarthritis 07/13/2012   Past Surgical History:  Procedure Laterality Date   COLOSTOMY REVERSAL  02-21-2017     [place], [place] (OR record scanned in epic)   w/ sigmoid and descending colectomy with appendectomy   CYSTOSCOPY WITH INJECTION N/A 12/19/2019   Procedure: CYSTOSCOPY WITH INJECTION;  Surgeon: Sebastian Ache, MD;  Location: [facility];  Service: Urology;  Laterality: N/A;   CYSTOSCOPY WITH URETHRAL DILATATION N/A 12/19/2019   Procedure: CYSTOSCOPY WITH URETHRAL DILATATION;  Surgeon: Sebastian Ache, MD;  Location: [facility];  Service: Urology;  Laterality: N/A;  45 MINS   ENDOVASCULAR STENT INSERTION  2003 and 2007  --- both done in [place]   bilateral legs   PARTIAL COLECTOMY  09-04-2016  in [place], [place]   w/  colostomy for perferated divierticulitis   PARTIAL KNEE ARTHROPLASTY Left 2017   TOTAL KNEE ARTHROPLASTY Right 2012   TRANSURETHRAL RESECTION OF PROSTATE N/A 08/15/2019   Procedure: TRANSURETHRAL RESECTION OF THE PROSTATE (TURP);  Surgeon: Sebastian Ache, MD;  Location: [facility];  Service: Urology;  Laterality: N/A;    Family History  Problem Relation Age of Onset   Alzheimer's disease Mother    Lung cancer Father        non smoker    Medications- reviewed and updated Current Outpatient Medications  Medication Sig Dispense Refill   amLODipine (NORVASC) 10 MG tablet Take 1 tablet by mouth daily 90 tablet 2   aspirin EC 81 MG tablet Take 1 tablet (81 mg total) by mouth daily. 30 tablet 11   atorvastatin (LIPITOR) 40 MG tablet Take 1 tablet (40 mg total) by mouth daily. 90 tablet 3   doxepin (SINEQUAN) 25 MG capsule Take 1 capsule by mouth daily 90 capsule 2   finasteride (PROSCAR) 5 MG tablet Take 1 tablet (5 mg total) by mouth at bedtime. 90 tablet 3   Naphazoline-Pheniramine (ALLERGY EYE OP) Place 1 drop into both eyes daily as needed.     tadalafil (CIALIS) 20 MG tablet Take 1 tablet (20 mg total) by mouth  daily. 90 tablet 3   tizanidine (ZANAFLEX) 2 MG capsule Take 1 capsule (2 mg total) by mouth 3 (three) times daily. (Patient taking differently: Take 2 mg by mouth 3 (three) times daily as needed for muscle spasms.) 90 capsule 2   triamcinolone ointment (KENALOG) 0.5 % Apply 1 application topically 3 (three) times daily. (Patient taking differently: Apply 1 application topically daily as needed (Skin rash).) 30 g 3   No current facility-administered medications for this visit.    Allergies-reviewed and updated No Known Allergies  Social History   Socioeconomic History   Marital status: Married    Spouse name: Not on file   Number of children: Not on file   Years of education: Not on file   Highest education level: Not on file  Occupational History   Not on file  Tobacco Use   Smoking status: Former    Years: 20.00  Types: Cigarettes    Quit date: 05/23/1994    Years since quitting: 27.2   Smokeless tobacco: Never  Vaping Use   Vaping Use: Never used  Substance and Sexual Activity   Alcohol use: Yes    Comment: occasionally   Drug use: Never   Sexual activity: Yes  Other Topics Concern   Not on file  Social History Narrative   Right handed   Lives in two story home with wife   Social Determinants of Health   Financial Resource Strain: Low Risk    Difficulty of Paying Living Expenses: Not hard at all  Food Insecurity: No Food Insecurity   Worried About Programme researcher, broadcasting/film/video in the Last Year: Never true   Ran Out of Food in the Last Year: Never true  Transportation Needs: No Transportation Needs   Lack of Transportation (Medical): No   Lack of Transportation (Non-Medical): No  Physical Activity: Sufficiently Active   Days of Exercise per Week: 5 days   Minutes of Exercise per Session: 120 min  Stress: No Stress Concern Present   Feeling of Stress : Not at all  Social Connections: Socially Integrated   Frequency of Communication with Friends and Family: Once a week    Frequency of Social Gatherings with Friends and Family: Three times a week   Attends Religious Services: 1 to 4 times per year   Active Member of Clubs or Organizations: Yes   Attends Banker Meetings: 1 to 4 times per year   Marital Status: Married        Objective:  Physical Exam: BP 115/66   Pulse 78   Temp 98.3 F (36.8 C) (Temporal)   Ht 5\' 10"  (1.778 m)   Wt 185 lb 12.8 oz (84.3 kg)   SpO2 97%   BMI 26.66 kg/m   Body mass index is 26.66 kg/m. Wt Readings from Last 3 Encounters:  08/28/21 185 lb 12.8 oz (84.3 kg)  05/22/21 199 lb 12.8 oz (90.6 kg)  05/08/21 185 lb 3 oz (84 kg)   Gen: NAD, resting comfortably HEENT: TMs normal bilaterally. OP clear. No thyromegaly noted.  CV: RRR with no murmurs appreciated Pulm: NWOB, CTAB with no crackles, wheezes, or rhonchi GI: Normal bowel sounds present. Soft, Nontender, Nondistended. MSK: no edema, cyanosis, or clubbing noted Skin: warm, dry Neuro: CN2-12 grossly intact. Strength 5/5 in upper and lower extremities. Reflexes symmetric and intact bilaterally.  Psych: Normal affect and thought content     I,Jordan Kelly,acting as a scribe for 05/10/21, MD.,have documented all relevant documentation on the behalf of Jacquiline Doe, MD,as directed by  Jacquiline Doe, MD while in the presence of Jacquiline Doe, MD.   I, Jacquiline Doe, MD, have reviewed all documentation for this visit. The documentation on 08/28/21 for the exam, diagnosis, procedures, and orders are all accurate and complete.  10/28/21. Katina Degree, MD 08/28/2021 10:04 AM

## 2021-08-28 NOTE — Assessment & Plan Note (Signed)
At goal on amlodipine 10 mg daily. 

## 2021-08-28 NOTE — Assessment & Plan Note (Signed)
Check labs.  Continue Lipitor 40 mg daily. 

## 2021-08-31 NOTE — Progress Notes (Signed)
Please inform patient of the following:  His blood sugar is borderline elevated.  Everything else is normal.  Do not need to start meds.  He should continue working on diet and exercise and we can recheck in about a year.

## 2022-01-03 ENCOUNTER — Encounter: Payer: Self-pay | Admitting: Family Medicine

## 2022-01-03 ENCOUNTER — Other Ambulatory Visit: Payer: Self-pay | Admitting: Family Medicine

## 2022-01-04 MED ORDER — TIZANIDINE HCL 2 MG PO CAPS: 2.0000 mg | ORAL_CAPSULE | Freq: Three times a day (TID) | ORAL | 2 refills | Status: DC

## 2022-01-11 ENCOUNTER — Ambulatory Visit
Admission: EM | Admit: 2022-01-11 | Discharge: 2022-01-11 | Disposition: A | Discharge status: Home / Self Care | Payer: PPO | Attending: Emergency Medicine | Admitting: Emergency Medicine

## 2022-01-11 ENCOUNTER — Encounter: Payer: Self-pay | Admitting: Emergency Medicine

## 2022-01-11 ENCOUNTER — Other Ambulatory Visit: Payer: Self-pay

## 2022-01-11 DIAGNOSIS — H6692 Otitis media, unspecified, left ear: Secondary | ICD-10-CM

## 2022-01-11 DIAGNOSIS — J029 Acute pharyngitis, unspecified: Secondary | ICD-10-CM | POA: Diagnosis not present

## 2022-01-11 DIAGNOSIS — Z7689 Persons encountering health services in other specified circumstances: Secondary | ICD-10-CM | POA: Diagnosis not present

## 2022-01-11 DIAGNOSIS — I10 Essential (primary) hypertension: Secondary | ICD-10-CM

## 2022-01-11 LAB — POCT RAPID STREP A (OFFICE): Rapid Strep A Screen: NEGATIVE

## 2022-01-11 MED ORDER — AZITHROMYCIN 250 MG PO TABS: 250.0000 mg | ORAL_TABLET | Freq: Every day | ORAL | 0 refills | Status: DC

## 2022-01-11 NOTE — Discharge Instructions (Addendum)
Take the Zithromax as directed for your ear infection and other symptoms.    Your COVID and Flu tests are pending.  You should self quarantine until the test results are back.    Take Tylenol or ibuprofen as needed for fever or discomfort.  Rest and keep yourself hydrated.    Follow-up with your primary care provider if your symptoms are not improving.    Your blood pressure is elevated today at 166/79.  Please have this rechecked by your primary care provider in 2-4 weeks.

## 2022-01-11 NOTE — ED Triage Notes (Signed)
Pt c/o bilateral ear pain and ST x 4 days

## 2022-01-11 NOTE — ED Provider Notes (Signed)
Jim Mathis    CSN: [id number] Arrival date & time: 01/11/22  1028      History   Chief Complaint Chief Complaint  Patient presents with   Sore Throat   Otalgia    HPI Daqwan Dougal. is a 71 y.o. male.  Patient presents with 3-4 day history of ear pain and sore throat.  He reports throat is very painful when swallowing. Treatment at home with Tylenol and OTC cold medication.  He denies fever, rash, cough, shortness of breath, vomiting, diarrhea, or other symptoms.  His medical history includes hypertension.  The history is provided by the patient, the spouse and medical records.   Past Medical History:  Diagnosis Date   BPH with obstruction/lower urinary tract symptoms    Carotid stenosis, right    per pt was told he has some stenosis but not enough for surgery;   in epic under media ,  carotid ultrasound result right ICA 50-69% and incidental finding thyroid nodule   Cervicalgia    ED (erectile dysfunction)    History of colon resection    per pt 12 hours after partial knee arthroplasty 2017,  s/p partial colectomy was possible from divertiulitis   Hyperlipidemia    Hypertension    Migraines    PAD (peripheral artery disease) (HCC) 08-14-2019  per pt when he walks (walks 2 miles a day) right foot gets numb but recovers quickly and when he rides his bike left hand gets numb by recovers quickly ;   denies claudication or swelling   per pt s/p  stenting to bilateral lower legs in [place] one in 2003 and the other 2007,  no follow up since moved to [facility]   Wears glasses     Patient Active Problem List   Diagnosis Date Noted   SBO (small bowel obstruction) (HCC) 05/08/2021   PAD (peripheral artery disease) (HCC)    Actinic keratosis 05/06/2020   Nonintractable headache 08/22/2019   BPH with ED and OAB 05/24/2019   Essential hypertension 05/24/2019   Dyslipidemia 05/24/2019   Anxiety 05/24/2019   Seborrheic dermatitis 05/24/2019   Osteoarthritis 07/13/2012     Past Surgical History:  Procedure Laterality Date   COLOSTOMY REVERSAL  02-21-2017     [place], [place] (OR record scanned in epic)   w/ sigmoid and descending colectomy with appendectomy   CYSTOSCOPY WITH INJECTION N/A 12/19/2019   Procedure: CYSTOSCOPY WITH INJECTION;  Surgeon: Sebastian Ache, MD;  Location: [facility];  Service: Urology;  Laterality: N/A;   CYSTOSCOPY WITH URETHRAL DILATATION N/A 12/19/2019   Procedure: CYSTOSCOPY WITH URETHRAL DILATATION;  Surgeon: Sebastian Ache, MD;  [facility];  Service: Urology;  Laterality: N/A;  45 MINS   ENDOVASCULAR STENT INSERTION  2003 and 2007  --- both done in [place]   bilateral legs   PARTIAL COLECTOMY  09-04-2016  in [place], [place]   w/  colostomy for perferated divierticulitis   PARTIAL KNEE ARTHROPLASTY Left 2017   TOTAL KNEE ARTHROPLASTY Right 2012   TRANSURETHRAL RESECTION OF PROSTATE N/A 08/15/2019   Procedure: TRANSURETHRAL RESECTION OF THE PROSTATE (TURP);  Surgeon: Sebastian Ache, MD;  Location: [facility];  Service: Urology;  Laterality: N/A;       Home Medications    Prior to Admission medications   Medication Sig Start Date End Date Taking? Authorizing Provider  azithromycin (ZITHROMAX) 250 MG tablet Take 1 tablet (250 mg total) by mouth daily. Take first 2 tablets together, then  1 every day until finished. 01/11/22  Yes Mickie Bailate, Kenlyn Lose H, NP  amLODipine (NORVASC) 10 MG tablet Take 1 tablet by mouth daily 06/09/21   Ardith DarkParker, Caleb M, MD  aspirin EC 81 MG tablet Take 1 tablet (81 mg total) by mouth daily. 05/17/21   Elgergawy, Leana Roeawood S, MD  atorvastatin (LIPITOR) 40 MG tablet Take 1 tablet (40 mg total) by mouth daily. 06/18/21   Ardith DarkParker, Caleb M, MD  doxepin [facility](SINEQUAN) 25 MG capsule Take 1 capsule by mouth daily 06/09/21   Ardith DarkParker, Caleb M, MD  finasteride (PROSCAR) 5 MG tablet Take 1 tablet (5 mg total) by mouth at bedtime. 05/06/20   Ardith DarkParker, Caleb M, MD   Naphazoline-Pheniramine (ALLERGY EYE OP) Place 1 drop into both eyes daily as needed.    [provider]  tadalafil (CIALIS) 20 MG tablet Take 1 tablet (20 mg total) by mouth daily. 03/27/21   Ardith DarkParker, Caleb M, MD  tizanidine (ZANAFLEX) 2 MG capsule Take 1 capsule (2 mg total) by mouth 3 (three) times daily. 01/04/22   Ardith DarkParker, Caleb M, MD  triamcinolone ointment (KENALOG) 0.5 % APPLY TO AFFECTED AREA(S) THREE TIMES A DAY TOPICALLY 01/04/22   Ardith DarkParker, Caleb M, MD    Family History Family History  Problem Relation Age of Onset   Alzheimer's disease Mother    Lung cancer Father        non smoker    Social History Social History   Tobacco Use   Smoking status: Former    Years: 20.00    Types: Cigarettes    Quit date: 05/23/1994    Years since quitting: 27.6   Smokeless tobacco: Never  Vaping Use   Vaping Use: Never used  Substance Use Topics   Alcohol use: Yes    Comment: occasionally   Drug use: Never     Allergies   Patient has no known allergies.   Review of Systems Review of Systems  Constitutional:  Negative for chills and fever.  HENT:  Positive for ear pain and sore throat.   Respiratory:  Negative for cough and shortness of breath.   Cardiovascular:  Negative for chest pain and palpitations.  Gastrointestinal:  Negative for diarrhea and vomiting.  Skin:  Negative for color change and rash.  All other systems reviewed and are negative.   Physical Exam Triage Vital Signs ED Triage Vitals  Enc Vitals Group     BP 01/11/22 1152 (!) 166/79     Pulse Rate 01/11/22 1152 94     Resp 01/11/22 1152 18     Temp 01/11/22 1152 99.4 F (37.4 C)     Temp Source 01/11/22 1152 Oral     SpO2 01/11/22 1152 96 %     Weight --      Height --      Head Circumference --      Peak Flow --      Pain Score 01/11/22 1155 4     Pain Loc --      Pain Edu? --      Excl. in GC? --    No data found.  Updated Vital Signs BP (!) 166/79 (BP Location: Left Arm)    Pulse 94     Temp 99.4 F (37.4 C) (Oral)    Resp 18    SpO2 96%   Visual Acuity Right Eye Distance:   Left Eye Distance:   Bilateral Distance:    Right Eye Near:   Left Eye Near:  Bilateral Near:     Physical Exam Vitals and nursing note reviewed.  Constitutional:      General: He is not in acute distress.    Appearance: He is well-developed.  HENT:     Right Ear: Tympanic membrane normal.     Left Ear: Tympanic membrane is erythematous.     Nose: Nose normal.     Mouth/Throat:     Mouth: Mucous membranes are moist.     Pharynx: Posterior oropharyngeal erythema present.  Cardiovascular:     Rate and Rhythm: Normal rate and regular rhythm.     Heart sounds: Normal heart sounds.  Pulmonary:     Effort: Pulmonary effort is normal. No respiratory distress.     Breath sounds: Normal breath sounds.  Musculoskeletal:     Cervical back: Neck supple.  Skin:    General: Skin is warm and dry.  Neurological:     Mental Status: He is alert.  Psychiatric:        Mood and Affect: Mood normal.        Behavior: Behavior normal.     UC Treatments / Results  Labs (all labs ordered are listed, but only abnormal results are displayed) Labs Reviewed  COVID-19, FLU A+B NAA  POCT RAPID STREP A (OFFICE)    EKG   Radiology No results found.  Procedures Procedures (including critical care time)  Medications Ordered in UC Medications - No data to display  Initial Impression / Assessment and Plan / UC Course  I have reviewed the triage vital signs and the nursing notes.  Pertinent labs & imaging results that were available during my care of the patient were reviewed by me and considered in my medical decision making (see chart for details).    Sore throat, Left otitis media.  Elevated blood pressure with HTN.  Treating ear infection with Zithromax.  COVID and Flu pending.  Instructed patient to self quarantine per CDC guidelines.  Discussed symptomatic treatment including Tylenol or  ibuprofen, rest, hydration.  Instructed patient to follow up with PCP if symptoms are not improving.  Also discussed with patient that his blood pressure is elevated today and needs to be rechecked by PCP in 2 to 4 weeks.  Education provided on managing hypertension.  Patient agrees to plan of care.   Final Clinical Impressions(s) / UC Diagnoses   Final diagnoses:  Sore throat  Left otitis media, unspecified otitis media type  Elevated blood pressure reading in office with diagnosis of hypertension     Discharge Instructions      Take the Zithromax as directed for your ear infection and other symptoms.    Your COVID and Flu tests are pending.  You should self quarantine until the test results are back.    Take Tylenol or ibuprofen as needed for fever or discomfort.  Rest and keep yourself hydrated.    Follow-up with your primary care provider if your symptoms are not improving.    Your blood pressure is elevated today at 166/79.  Please have this rechecked by your primary care provider in 2-4 weeks.          ED Prescriptions     Medication Sig Dispense Auth. Provider   azithromycin (ZITHROMAX) 250 MG tablet Take 1 tablet (250 mg total) by mouth daily. Take first 2 tablets together, then 1 every day until finished. 6 tablet Mickie Bailate, April Carlyon H, NP      PDMP not reviewed this encounter.   Mickie Bailate, Nazir Hacker H,  NP 01/11/22 1302

## 2022-01-12 LAB — COVID-19, FLU A+B NAA
Influenza A, NAA: NOT DETECTED
Influenza B, NAA: NOT DETECTED
SARS-CoV-2, NAA: NOT DETECTED

## 2022-01-14 ENCOUNTER — Ambulatory Visit (INDEPENDENT_AMBULATORY_CARE_PROVIDER_SITE_OTHER): Payer: PPO | Admitting: Family Medicine

## 2022-01-14 ENCOUNTER — Other Ambulatory Visit: Payer: Self-pay

## 2022-01-14 ENCOUNTER — Encounter: Payer: Self-pay | Admitting: Family Medicine

## 2022-01-14 VITALS — BP 140/72 | HR 83 | Temp 97.2°F | Ht 70.0 in | Wt 183.8 lb

## 2022-01-14 DIAGNOSIS — I10 Essential (primary) hypertension: Secondary | ICD-10-CM | POA: Diagnosis not present

## 2022-01-14 DIAGNOSIS — J329 Chronic sinusitis, unspecified: Secondary | ICD-10-CM

## 2022-01-14 MED ORDER — AMOXICILLIN-POT CLAVULANATE 875-125 MG PO TABS: 1.0000 | ORAL_TABLET | Freq: Two times a day (BID) | ORAL | 0 refills | Status: DC

## 2022-01-14 MED ORDER — BENZONATATE 200 MG PO CAPS: 200.0000 mg | ORAL_CAPSULE | Freq: Two times a day (BID) | ORAL | 0 refills | Status: DC | PRN

## 2022-01-14 MED ORDER — AZELASTINE HCL 0.1 % NA SOLN: 2.0000 | Freq: Two times a day (BID) | NASAL | 12 refills | Status: DC

## 2022-01-14 NOTE — Assessment & Plan Note (Signed)
At goal today per JNC 8 on amlodipine 10 mg daily.  Was recently elevated likely due to acute illness and decongestions.  He will continue to monitor at home and let me know if persistently elevated.

## 2022-01-14 NOTE — Progress Notes (Signed)
° °  Jim Mathis. is a 71 y.o. male who presents today for an office visit.  Assessment/Plan:  New/Acute Problems: Sinusitis No red flags.  Given length of symptoms, we will start Augmentin and Astelin.  Also start Tessalon for cough.  Encourage good hydration.  Can use over-the-counter meds as needed.  Discussed reasons to return to care.  Follow-up as needed.  Chronic Problems Addressed Today: Essential hypertension At goal today per JNC 8 on amlodipine 10 mg daily.  Was recently elevated likely due to acute illness and decongestions.  He will continue to monitor at home and let me know if persistently elevated.     Subjective:  HPI:  Patient here to follow up. He went to urgent with sore throat and otalgia on 01/11/2022. He was having throat pain while swallowing at that time. He was treated with Zithromax for ear infection. His blood pressure was elevated in the urgent care. He was tested negative for Covid. Strep and flu test was negative as well.   He still have sore throat and cough. Symptoms has improved than the last few days. He notes earache has been improved. However, cough with plegum and sore throat is still present. He has developed headache as well. Had some congestion. He notes his blood pressure was 166/79 in the urgent care. He has tried over the counter cough and cold to help alleviate the pain. No fever, nausea or chills. No other symptoms. No sick contact.  He has had issue with numbness in the hands. He notes he recently moved and his hands go numb whenever he moved boxes. No obvious precipitating events.  No other obvious alleviating or aggravating factors.        Objective:  Physical Exam: BP 140/72 Comment: right arm   Pulse 83    Temp (!) 97.2 F (36.2 C)    Ht 5\' 10"  (1.778 m)    Wt 183 lb 12.8 oz (83.4 kg)    SpO2 98%    BMI 26.37 kg/m   Gen: No acute distress, resting comfortably HEENT: TMs with clear effusion.  OP erythematous.  Nasal mucosa  erythematous and boggy bilaterally.  Decreased maxillary sinus transillumination bilaterally. CV: Regular rate and rhythm with no murmurs appreciated Pulm: Normal work of breathing, clear to auscultation bilaterally with no crackles, wheezes, or rhonchi Neuro: Grossly normal, moves all extremities Psych: Normal affect and thought content       I,Savera Zaman,acting as a scribe for , MD.,have documented all relevant documentation on the behalf of Jacquiline Doe, MD,as directed by  Jacquiline Doe, MD while in the presence of Jacquiline Doe, MD.   I, Jacquiline Doe, MD, have reviewed all documentation for this visit. The documentation on 01/14/22 for the exam, diagnosis, procedures, and orders are all accurate and complete.  Time Spent: 30 minutes of total time was spent on the date of the encounter performing the following actions: chart review prior to seeing the patient including his recent urgent care visit, obtaining history, performing a medically necessary exam, counseling on the treatment plan, placing orders, and documenting in our EHR.   01/16/22. Katina Degree, MD 01/14/2022 11:51 AM

## 2022-01-14 NOTE — Patient Instructions (Addendum)
It was very nice to see you today!  I think you have a sinus infeciton   Please start the Astelin and Augmentin.  Use Tessalon as needed for your cough.  Let me know if not improving by next week.  Take care, Dr Jimmey Ralph  PLEASE NOTE:  If you had any lab tests please let us know if you have not heard back within a few days. You may see your results on mychart before we have a chance to review them but we will give you a call once they are reviewed by [place]. If we ordered any referrals today, please let us know if you have not heard from their office within the next week.   Please try these tips to maintain a healthy lifestyle:  Eat at least 3 REAL meals and 1-2 snacks per day.  Aim for no more than 5 hours between eating.  If you eat breakfast, please do so within one hour of getting up.   Each meal should contain half fruits/vegetables, one quarter protein, and one quarter carbs (no bigger than a computer mouse)  Cut down on sweet beverages. This includes juice, soda, and sweet tea.   Drink at least 1 glass of water with each meal and aim for at least 8 glasses per day  Exercise at least 150 minutes every week.

## 2022-01-19 ENCOUNTER — Ambulatory Visit: Payer: PPO | Admitting: Family Medicine

## 2022-01-28 ENCOUNTER — Encounter: Payer: Self-pay | Admitting: Family Medicine

## 2022-01-28 ENCOUNTER — Ambulatory Visit (INDEPENDENT_AMBULATORY_CARE_PROVIDER_SITE_OTHER): Payer: PPO | Admitting: Family Medicine

## 2022-01-28 ENCOUNTER — Other Ambulatory Visit: Payer: Self-pay

## 2022-01-28 VITALS — BP 132/72 | HR 73 | Temp 98.2°F | Ht 70.0 in | Wt 182.2 lb

## 2022-01-28 DIAGNOSIS — J329 Chronic sinusitis, unspecified: Secondary | ICD-10-CM | POA: Diagnosis not present

## 2022-01-28 DIAGNOSIS — I10 Essential (primary) hypertension: Secondary | ICD-10-CM | POA: Diagnosis not present

## 2022-01-28 DIAGNOSIS — R059 Cough, unspecified: Secondary | ICD-10-CM

## 2022-01-28 DIAGNOSIS — F419 Anxiety disorder, unspecified: Secondary | ICD-10-CM | POA: Diagnosis not present

## 2022-01-28 DIAGNOSIS — J029 Acute pharyngitis, unspecified: Secondary | ICD-10-CM | POA: Diagnosis not present

## 2022-01-28 LAB — POC COVID19 BINAXNOW: SARS Coronavirus 2 Ag: NEGATIVE

## 2022-01-28 LAB — POCT RAPID STREP A (OFFICE): Rapid Strep A Screen: NEGATIVE

## 2022-01-28 MED ORDER — PREDNISONE 50 MG PO TABS: ORAL_TABLET | ORAL | 0 refills | Status: DC

## 2022-01-28 NOTE — Assessment & Plan Note (Signed)
At goal on amlodipine 10 mg daily. 

## 2022-01-28 NOTE — Patient Instructions (Signed)
It was very nice to see you today!  Please start the prednisone.  Make sure that you are getting plenty of fluids.  You can continue with the nasal spray.  Let me know if not improving by next week.  Take care, Dr Jimmey Ralph  PLEASE NOTE:  If you had any lab tests please let us know if you have not heard back within a few days. You may see your results on mychart before we have a chance to review them but we will give you a call once they are reviewed by [place]. If we ordered any referrals today, please let us know if you have not heard from their office within the next week.   Please try these tips to maintain a healthy lifestyle:  Eat at least 3 REAL meals and 1-2 snacks per day.  Aim for no more than 5 hours between eating.  If you eat breakfast, please do so within one hour of getting up.   Each meal should contain half fruits/vegetables, one quarter protein, and one quarter carbs (no bigger than a computer mouse)  Cut down on sweet beverages. This includes juice, soda, and sweet tea.   Drink at least 1 glass of water with each meal and aim for at least 8 glasses per day  Exercise at least 150 minutes every week.

## 2022-01-28 NOTE — Assessment & Plan Note (Signed)
Stable on doxepin 25 mg daily.  Do not anticipate he will have any issues with prednisone.

## 2022-01-28 NOTE — Progress Notes (Signed)
° °  Jim Mathis. is a 71 y.o. male who presents today for an office visit.  Assessment/Plan:  New/Acute Problems: Sinusitis He has completed course of azithromycin and Augmentin.  Still has mild persistent symptoms.  He does have some evidence of eustachian tube dysfunction and rhinitis today.  We will start prednisone burst.  He can continue Astelin.  He will let me know if symptoms do not continue to improve and we can consider imaging or referral to ENT.  Chronic Problems Addressed Today: Anxiety Stable on doxepin 25 mg daily.  Do not anticipate he will have any issues with prednisone.  Essential hypertension At goal on amlodipine 10 mg daily.     Subjective:  HPI:  Patient here to follow up. We last saw him in the office on 01/14/2022. He was having some URI symptoms. His symptoms included sore throat, cough and congestion in that visit. At that time, he was started on Augmentin and Astelin. He was also started on Tessalon for cough. Today, he notes he still have some sore throat and ear pressure. He completed his  antibiotics. Symptoms have been better than before.However, Symptoms are not resolved. He still have some dry cough. Symptoms are worse in the morning. He get lightheadedness and dizziness while doing normal things at his house. Had Covid test in the office which was negative. Have some nasal congestion and postnasal drip. No fever or chills       Objective:  Physical Exam: BP 132/72 (BP Location: Right Arm)    Pulse 73    Temp 98.2 F (36.8 C) (Temporal)    Ht 5\' 10"  (1.778 m)    Wt 182 lb 3.2 oz (82.6 kg)    SpO2 98%    BMI 26.14 kg/m   Gen: No acute distress, resting comfortably HEENT: TMs with clear effusion.  OP erythematous.  Nasal mucosa erythematous and boggy bilaterally with clear discharge. CV: Regular rate and rhythm with no murmurs appreciated Pulm: Normal work of breathing, clear to auscultation bilaterally with no crackles, wheezes, or rhonchi Neuro:  Grossly normal, moves all extremities Psych: Normal affect and thought content       I,Jim Mathis,acting as a scribe for , MD.,have documented all relevant documentation on the behalf of Jim Doe, MD,as directed by  Jim Doe, MD while in the presence of Jim Doe, MD.   I, Jim Doe, MD, have reviewed all documentation for this visit. The documentation on 01/28/22 for the exam, diagnosis, procedures, and orders are all accurate and complete.  03/28/22. Katina Degree, MD 01/28/2022 2:59 PM

## 2022-02-03 ENCOUNTER — Telehealth: Payer: Self-pay

## 2022-02-03 ENCOUNTER — Encounter: Payer: Self-pay | Admitting: Family Medicine

## 2022-02-03 NOTE — Telephone Encounter (Signed)
Ok to write letter.  Algis Greenhouse. Jerline Pain, MD 02/03/2022 10:37 AM

## 2022-02-03 NOTE — Telephone Encounter (Signed)
Please see note.

## 2022-02-03 NOTE — Telephone Encounter (Signed)
Patient letter has been completed. Called to advise it would be at front desk for pick-up.

## 2022-02-03 NOTE — Telephone Encounter (Signed)
Patients wife called in and states she needs a letter from Dr. Jimmey Ralph for when she missed her flight back in 05/10/21 since her husband was in the hospital she has to cancel her flight, Patient wife states to get her money back she needs a letter stating her husband was in the hospital from 05/07/21 and discharged on 05/10/21. If any questions can call wife at [phone].

## 2022-03-11 ENCOUNTER — Other Ambulatory Visit: Payer: Self-pay | Admitting: Family Medicine

## 2022-03-21 ENCOUNTER — Other Ambulatory Visit: Payer: Self-pay | Admitting: Family Medicine

## 2022-04-13 ENCOUNTER — Other Ambulatory Visit: Payer: Self-pay | Admitting: Family Medicine

## 2022-04-24 ENCOUNTER — Encounter: Payer: Self-pay | Admitting: Family Medicine

## 2022-04-26 ENCOUNTER — Ambulatory Visit (INDEPENDENT_AMBULATORY_CARE_PROVIDER_SITE_OTHER): Payer: PPO

## 2022-04-26 VITALS — BP 114/64 | HR 75 | Temp 98.4°F | Wt 187.4 lb

## 2022-04-26 DIAGNOSIS — Z0000 Encounter for general adult medical examination without abnormal findings: Secondary | ICD-10-CM | POA: Diagnosis not present

## 2022-04-26 NOTE — Patient Instructions (Signed)
Jim Mathis , ?Thank you for taking time to come for your Medicare Wellness Visit. I appreciate your ongoing commitment to your health goals. Please review the following plan we discussed and let me know if I can assist you in the future.  ? ?Screening recommendations/referrals: ?Colonoscopy: Done 01/26/17 repeat every 10 years  ?Recommended yearly ophthalmology/optometry visit for glaucoma screening and checkup ?Recommended yearly dental visit for hygiene and checkup ? ?Vaccinations: ?Influenza vaccine: Done 08/20/21 repeat every year  ?Pneumococcal vaccine: Up to date ?Tdap vaccine: Done 02/08/18 repeat every 10 years  ?Shingles vaccine: 07/15/19 & 04/09/20   ?Covid-19: Completed 2/13, 3/10, 09/23/20 & 5/6, 10/24/21 ? ?Advanced directives: Copies in chart  ? ?Conditions/risks identified: Stay health and active  ? ?Next appointment: Follow up in one year for your annual wellness visit.  ? ?Preventive Care 60 Years and Older, Male ?Preventive care refers to lifestyle choices and visits with your health care provider that can promote health and wellness. ?What does preventive care include? ?A yearly physical exam. This is also called an annual well check. ?Dental exams once or twice a year. ?Routine eye exams. Ask your health care provider how often you should have your eyes checked. ?Personal lifestyle choices, including: ?Daily care of your teeth and gums. ?Regular physical activity. ?Eating a healthy diet. ?Avoiding tobacco and drug use. ?Limiting alcohol use. ?Practicing safe sex. ?Taking low doses of aspirin every day. ?Taking vitamin and mineral supplements as recommended by your health care provider. ?What happens during an annual well check? ?The services and screenings done by your health care provider during your annual well check will depend on your age, overall health, lifestyle risk factors, and family history of disease. ?Counseling  ?Your health care provider may ask you questions about your: ?Alcohol  use. ?Tobacco use. ?Drug use. ?Emotional well-being. ?Home and relationship well-being. ?Sexual activity. ?Eating habits. ?History of falls. ?Memory and ability to understand (cognition). ?Work and work Astronomer. ?Screening  ?You may have the following tests or measurements: ?Height, weight, and BMI. ?Blood pressure. ?Lipid and cholesterol levels. These may be checked every 5 years, or more frequently if you are over 14 years old. ?Skin check. ?Lung cancer screening. You may have this screening every year starting at age 61 if you have a 30-pack-year history of smoking and currently smoke or have quit within the past 15 years. ?Fecal occult blood test (FOBT) of the stool. You may have this test every year starting at age 68. ?Flexible sigmoidoscopy or colonoscopy. You may have a sigmoidoscopy every 5 years or a colonoscopy every 10 years starting at age 67. ?Prostate cancer screening. Recommendations will vary depending on your family history and other risks. ?Hepatitis C blood test. ?Hepatitis B blood test. ?Sexually transmitted disease (STD) testing. ?Diabetes screening. This is done by checking your blood sugar (glucose) after you have not eaten for a while (fasting). You may have this done every 1-3 years. ?Abdominal aortic aneurysm (AAA) screening. You may need this if you are a current or former smoker. ?Osteoporosis. You may be screened starting at age 76 if you are at high risk. ?Talk with your health care provider about your test results, treatment options, and if necessary, the need for more tests. ?Vaccines  ?Your health care provider may recommend certain vaccines, such as: ?Influenza vaccine. This is recommended every year. ?Tetanus, diphtheria, and acellular pertussis (Tdap, Td) vaccine. You may need a Td booster every 10 years. ?Zoster vaccine. You may need this after age 74. ?Pneumococcal  13-valent conjugate (PCV13) vaccine. One dose is recommended after age 21. ?Pneumococcal polysaccharide  (PPSV23) vaccine. One dose is recommended after age 54. ?Talk to your health care provider about which screenings and vaccines you need and how often you need them. ?This information is not intended to replace advice given to you by your health care provider. Make sure you discuss any questions you have with your health care provider. ?Document Released: 01/02/2016 Document Revised: 08/25/2016 Document Reviewed: 10/07/2015 ?Elsevier Interactive Patient Education ? 2017 [place]. ? ?Fall Prevention in the Home ?Falls can cause injuries. They can happen to people of all ages. There are many things you can do to make your home safe and to help prevent falls. ?What can I do on the outside of my home? ?Regularly fix the edges of walkways and driveways and fix any cracks. ?Remove anything that might make you trip as you walk through a door, such as a raised step or threshold. ?Trim any bushes or trees on the path to your home. ?Use bright outdoor lighting. ?Clear any walking paths of anything that might make someone trip, such as rocks or tools. ?Regularly check to see if handrails are loose or broken. Make sure that both sides of any steps have handrails. ?Any raised decks and porches should have guardrails on the edges. ?Have any leaves, snow, or ice cleared regularly. ?Use sand or salt on walking paths during winter. ?Clean up any spills in your garage right away. This includes oil or grease spills. ?What can I do in the bathroom? ?Use night lights. ?Install grab bars by the toilet and in the tub and shower. Do not use towel bars as grab bars. ?Use non-skid mats or decals in the tub or shower. ?If you need to sit down in the shower, use a plastic, non-slip stool. ?Keep the floor dry. Clean up any water that spills on the floor as soon as it happens. ?Remove soap buildup in the tub or shower regularly. ?Attach bath mats securely with double-sided non-slip rug tape. ?Do not have throw rugs and other things on the  floor that can make you trip. ?What can I do in the bedroom? ?Use night lights. ?Make sure that you have a light by your bed that is easy to reach. ?Do not use any sheets or blankets that are too big for your bed. They should not hang down onto the floor. ?Have a firm chair that has side arms. You can use this for support while you get dressed. ?Do not have throw rugs and other things on the floor that can make you trip. ?What can I do in the kitchen? ?Clean up any spills right away. ?Avoid walking on wet floors. ?Keep items that you use a lot in easy-to-reach places. ?If you need to reach something above you, use a strong step stool that has a grab bar. ?Keep electrical cords out of the way. ?Do not use floor polish or wax that makes floors slippery. If you must use wax, use non-skid floor wax. ?Do not have throw rugs and other things on the floor that can make you trip. ?What can I do with my stairs? ?Do not leave any items on the stairs. ?Make sure that there are handrails on both sides of the stairs and use them. Fix handrails that are broken or loose. Make sure that handrails are as long as the stairways. ?Check any carpeting to make sure that it is firmly attached to the stairs. Fix any carpet  that is loose or worn. ?Avoid having throw rugs at the top or bottom of the stairs. If you do have throw rugs, attach them to the floor with carpet tape. ?Make sure that you have a light switch at the top of the stairs and the bottom of the stairs. If you do not have them, ask someone to add them for you. ?What else can I do to help prevent falls? ?Wear shoes that: ?Do not have high heels. ?Have rubber bottoms. ?Are comfortable and fit you well. ?Are closed at the toe. Do not wear sandals. ?If you use a stepladder: ?Make sure that it is fully opened. Do not climb a closed stepladder. ?Make sure that both sides of the stepladder are locked into place. ?Ask someone to hold it for you, if possible. ?Clearly mark and make  sure that you can see: ?Any grab bars or handrails. ?First and last steps. ?Where the edge of each step is. ?Use tools that help you move around (mobility aids) if they are needed. These include: ?Canes. ?Walkers. ?Scoote

## 2022-04-26 NOTE — Progress Notes (Addendum)
? ?Subjective:  ? Jim Mathis. is a 71 y.o. male who presents for Medicare Annual/Subsequent preventive examination. ? ?Review of Systems    ? ?Cardiac Risk Factors include: advanced age (>52men, >65 women);dyslipidemia;hypertension;male gender ? ?   ?Objective:  ?  ?Today's Vitals  ? 04/26/22 1347  ?BP: 114/64  ?Pulse: 75  ?Temp: 98.4 ?F (36.9 ?C)  ?SpO2: 97%  ?Weight: 187 lb 6.4 oz (85 kg)  ? ?Body mass index is 26.89 kg/m?. ? ? ?  04/26/2022  ?  1:54 PM 05/08/2021  ?  2:24 AM 05/07/2021  ?  9:59 PM 04/20/2021  ?  2:02 PM 03/11/2020  ?  3:16 PM 12/19/2019  ?  9:59 AM 08/15/2019  ? 11:49 AM  ?Advanced Directives  ?Does Patient Have a Medical Advance Directive? Yes Yes No Yes Yes Yes Yes  ?Type of Estate agent of [employer] Power of [place];Living will  Living will Living will;Healthcare Power of [employer] Power of [employer] Power of [place];Living will  ?Does patient want to make changes to medical advance directive?  Yes (Inpatient - patient defers changing a medical advance directive and declines information at this time)   No - Patient declined No - Patient declined   ?Copy of Healthcare Power of Attorney in Chart? Yes - validated most recent copy scanned in chart (See row information) No - copy requested   No - copy requested Yes - validated most recent copy scanned in chart (See row information) Yes - validated most recent copy scanned in chart (See row information)  ? ? ?Current Medications (verified) ?Outpatient Encounter Medications as of 04/26/2022  ?Medication Sig  ? amLODipine (NORVASC) 10 MG tablet Take 1 tablet by mouth daily  ? atorvastatin (LIPITOR) 40 MG tablet Take 1 tablet (40 mg total) by mouth daily.  ? doxepin (SINEQUAN) 25 MG capsule Take 1 capsule by mouth daily  ? finasteride (PROSCAR) 5 MG tablet Take 1 tablet (5 mg total) by mouth at bedtime.  ? Naphazoline-Pheniramine (ALLERGY EYE OP) Place 1 drop into both eyes daily as needed.  ?  tadalafil (CIALIS) 20 MG tablet TAKE ONE TABLET BY MOUTH DAILY  ? tizanidine (ZANAFLEX) 2 MG capsule Take 1 capsule (2 mg total) by mouth 3 (three) times daily.  ? triamcinolone ointment (KENALOG) 0.5 % APPLY TO AFFECTED AREA(S) THREE TIMES A DAY TOPICALLY  ? PFIZER COVID-19 VAC BIVALENT injection   ? [DISCONTINUED] aspirin EC 81 MG tablet Take 1 tablet (81 mg total) by mouth daily.  ? [DISCONTINUED] azelastine (ASTELIN) 0.1 % nasal spray Place 2 sprays into both nostrils 2 (two) times daily.  ? [DISCONTINUED] predniSONE (DELTASONE) 50 MG tablet Take 1 tablet daily for 5 days.  ? ?No facility-administered encounter medications on file as of 04/26/2022.  ? ? ?Allergies (verified) ?Pollen extract  ? ?History: ?Past Medical History:  ?Diagnosis Date  ? BPH with obstruction/lower urinary tract symptoms   ? Carotid stenosis, right   ? per pt was told he has some stenosis but not enough for surgery;   in epic under media ,  carotid ultrasound result right ICA 50-69% and incidental finding thyroid nodule  ? Cervicalgia   ? ED (erectile dysfunction)   ? History of colon resection   ? per pt 12 hours after partial knee arthroplasty 2017,  s/p partial colectomy was possible from divertiulitis  ? Hyperlipidemia   ? Hypertension   ? Migraines   ? PAD (peripheral artery disease) (HCC) 08-14-2019  per pt when he walks (walks 2 miles a day) right foot gets numb but recovers quickly and when he rides his bike left hand gets numb by recovers quickly ;   denies claudication or swelling  ? per pt s/p  stenting to bilateral lower legs in [place] one in 2003 and the other 2007,  no follow up since moved to [place]  ? Wears glasses   ? ?Past Surgical History:  ?Procedure Laterality Date  ? [place]OST[place]Y REVERSAL  02[place]21-2017     [place] in epic[place]  ? w/ sigmoid and descending colectomy with appendectomy  ? CYSTOSCOPY WITH INJECTION N/A 12/19/2019  ? Procedure: CYSTOSCOPY WITH INJECTION;  Surgeon: Sebastian AcheManny, Theodore, MD;  [facility] [place];  Service: Urology;  Laterality: N/A;  ? CYSTOSCOPY WITH URETHRAL DILATATION N/A 12/19/2019  ? Procedure: CYSTOSCOPY WITH URETHRAL DILATATION;  Surgeon: Sebastian AcheManny, Theodore, MD;  [facility] [place];  Service: Urology;  Laterality: N/A;  45 MINS  ? ENDOVASCULAR STENT INSERTION  2003 and 2007  --- both done in [place]  ? bilateral legs  ? PARTIAL COLECTOMY  09-04-2016  [place]  ? w/  colostomy for perferated divierticulitis  ? PARTIAL KNEE ARTHROPLASTY Left 2017  ? TOTAL KNEE ARTHROPLASTY Right 2012  ? TRANSURETHRAL RESECTION OF PROSTATE N/A 08/15/2019  ? Procedure: TRANSURETHRAL RESECTION OF THE PROSTATE (TURP);  Surgeon: Sebastian AcheManny, Theodore, MD;  [facility] ;  Service: Urology;  Laterality: N/A;  ? ?Family History  ?Problem Relation Age of Onset  ? Alzheimer's disease Mother   ? Lung cancer Father   ?     non smoker  ? ?Social History  ? ?Socioeconomic History  ? Marital status: Married  ?  Spouse name: Not on file  ? Number of children: Not on file  ? Years of education: Not on file  ? Highest education level: Not on file  ?Occupational History  ? Not on file  ?Tobacco Use  ? Smoking status: Former  ?  Years: 20.00  ?  Types: Cigarettes  ?  Quit date: 05/23/1994  ?  Years since quitting: 27.9  ? Smokeless tobacco: Never  ?Vaping Use  ? Vaping Use: Never used  ?Substance and Sexual Activity  ? Alcohol use: Yes  ?  Comment: occasionally  ? Drug use: Never  ? Sexual activity: Yes  ?Other Topics Concern  ? Not on file  ?Social History Narrative  ? Right handed  ? Lives in two story home with wife  ? ?Social Determinants of Health  ? ?Financial Resource Strain: Low Risk   ? Difficulty of Paying Living Expenses: Not hard at all  ?Food Insecurity: No Food Insecurity  ? Worried About Programme researcher, broadcasting/film/videounning Out of Food in the Last Year: Never true  ? Ran Out of Food in the Last Year: Never true  ?Transportation Needs: No Transportation Needs  ? Lack of Transportation  (Medical): No  ? Lack of Transportation (Non-Medical): No  ?Physical Activity: Sufficiently Active  ? Days of Exercise per Week: 3 days  ? Minutes of Exercise per Session: 150+ min  ?Stress: No Stress Concern Present  ? Feeling of Stress : Not at all  ?Social Connections: Socially Integrated  ? Frequency of Communication with Friends and Family: More than three times a week  ? Frequency of Social Gatherings with Friends and Family: More than three times a week  ? Attends Religious Services: 1 to 4 times per year  ?  Active Member of Clubs or Organizations: Yes  ? Attends Banker Meetings: 1 to 4 times per year  ? Marital Status: Married  ? ? ?Tobacco Counseling ?Counseling given: Not Answered ? ? ?Clinical Intake: ? ?Pre-visit preparation completed: Yes ? ?Pain : No/denies pain ? ?  ? ?BMI - recorded: 26.89 ?Nutritional Status: BMI 25 -29 Overweight ?Nutritional Risks: None ?Diabetes: No ? ?How often do you need to have someone help you when you read instructions, pamphlets, or other written materials from your doctor or pharmacy?: 1 - Never ? ?Diabetic?no ? ?Interpreter Needed?: No ? ?Information entered by :: Lanier Ensign, LPN ? ? ?Activities of Daily Living ? ?  04/26/2022  ?  1:57 PM 05/08/2021  ?  2:28 AM  ?In your present state of health, do you have any difficulty performing the following activities:  ?Hearing? 1   ?Comment slight loss   ?Vision? 0   ?Difficulty concentrating or making decisions? 0   ?Walking or climbing stairs? 0   ?Dressing or bathing? 1   ?Comment a little stiff at times   ?Doing errands, shopping? 0 0  ?Preparing Food and eating ? N   ?Using the Toilet? N   ?In the past six months, have you accidently leaked urine? N   ?Do you have problems with loss of bowel control? N   ?Managing your Medications? N   ?Managing your Finances? N   ?Housekeeping or managing your Housekeeping? N   ? ? ?Patient Care Team: ?Ardith Dark, MD as PCP - General (Family Medicine) ?Drema Dallas, DO  as Consulting Physician (Neurology) ?Sebastian Ache, MD as Consulting Physician (Urology) ?[name], [facility] as Pharmacist (Pharmacist) ? ?Indicate any recent Medical Services you may have received from other than

## 2022-05-24 ENCOUNTER — Ambulatory Visit (INDEPENDENT_AMBULATORY_CARE_PROVIDER_SITE_OTHER): Payer: PPO | Admitting: Family Medicine

## 2022-05-24 ENCOUNTER — Encounter: Payer: Self-pay | Admitting: Family Medicine

## 2022-05-24 VITALS — BP 131/72 | HR 77 | Temp 98.3°F | Ht 70.0 in | Wt 187.3 lb

## 2022-05-24 DIAGNOSIS — I10 Essential (primary) hypertension: Secondary | ICD-10-CM | POA: Diagnosis not present

## 2022-05-24 DIAGNOSIS — M1990 Unspecified osteoarthritis, unspecified site: Secondary | ICD-10-CM | POA: Diagnosis not present

## 2022-05-24 MED ORDER — MELOXICAM 15 MG PO TABS: 15.0000 mg | ORAL_TABLET | Freq: Every day | ORAL | 0 refills | Status: DC

## 2022-05-24 NOTE — Assessment & Plan Note (Signed)
At goal today on amlodipine 10 mg daily. 

## 2022-05-24 NOTE — Patient Instructions (Signed)
It was very nice to see you today!  You have arthritis in your hands.  Please use the Voltaren gel to help with this.  You probably have a little bit of arthritis in your hips.  I think you probably also have a strain of your hip flexors.  Please work on the exercises.  Take the meloxicam.  Let me know if not proving in the next 1 to 2 weeks.  Take care, Dr Jerline Pain  PLEASE NOTE:  If you had any lab tests please let us know if you have not heard back within a few days. You may see your results on mychart before we have a chance to review them but we will give you a call once they are reviewed by [place]. If we ordered any referrals today, please let us know if you have not heard from their office within the next week.   Please try these tips to maintain a healthy lifestyle:  Eat at least 3 REAL meals and 1-2 snacks per day.  Aim for no more than 5 hours between eating.  If you eat breakfast, please do so within one hour of getting up.   Each meal should contain half fruits/vegetables, one quarter protein, and one quarter carbs (no bigger than a computer mouse)  Cut down on sweet beverages. This includes juice, soda, and sweet tea.   Drink at least 1 glass of water with each meal and aim for at least 8 glasses per day  Exercise at least 150 minutes every week.

## 2022-05-24 NOTE — Progress Notes (Signed)
   Qualyn Oyervides. is a 71 y.o. male who presents today for an office visit.  Assessment/Plan:  New/Acute Problems: Hip pain No red flags.  Likely has some underlying osteoarthritis though also does have some signs of hip flexor strain.  We will start with 1 to 2-week course of meloxicam 15 mg daily.  We also discussed home exercise program and handout was given.  He will let me know if not proving in the next couple weeks and we can refer to PT and/or sports med.  Chronic Problems Addressed Today: Osteoarthritis Pain in bilateral hands secondary to arthritis.  Also probably has little bit of bilateral hip arthritis as well.  We will be starting meloxicam as above for his hip pain.  He can also use Voltaren gel as needed for his hand pain.  He will let me know if not improving and we can refer to sports medicine or PT.  Essential hypertension At goal today on amlodipine 10 mg daily.     Subjective:  HPI:  See A/P for status of chronic conditions.  Patient main concern today is bilateral hip and hand pain  Over the last few weeks he has noticed increasing swelling and stiffness in his hands.  Sometimes will wake him up at night.  Hands are very stiff first thing in the morning it takes him a while to start to loosen up.  He has been tried taking ibuprofen for this which is helped.  No obvious injuries or precipitating events.  He has also noticed more pain in his bilateral groin.  He has been doing more outdoor work and thinks he may have overdone it.  Ibuprofen seems to help this as well.  Hurts worse when bending over.       Objective:  Physical Exam: BP 131/72   Pulse 77   Temp 98.3 F (36.8 C) (Temporal)   Ht 5\' 10"  (1.778 m)   Wt 187 lb 4.8 oz (85 kg)   SpO2 98%   BMI 26.87 kg/m   Gen: No acute distress, resting comfortably CV: Regular rate and rhythm with no murmurs appreciated Pulm: Normal work of breathing, clear to auscultation bilaterally with no crackles,  wheezes, or rhonchi MSK: -Hands: Mild joint hypertrophy noted throughout.  Limited ability to make a fist due to edema.  No focal areas of tenderness -Hips: No deformities.  Decreased range of motion with internal and external rotation left greater than right.  Neurovascular intact distally.  4 out of 5 hip flexor strength bilaterally.  Resisted hip flexion reproduces his pain. Neuro: Grossly normal, moves all extremities Psych: Normal affect and thought content      Kynzlee Hucker M. , MD 05/24/2022 11:22 AM

## 2022-05-24 NOTE — Assessment & Plan Note (Signed)
Pain in bilateral hands secondary to arthritis.  Also probably has little bit of bilateral hip arthritis as well.  We will be starting meloxicam as above for his hip pain.  He can also use Voltaren gel as needed for his hand pain.  He will let me know if not improving and we can refer to sports medicine or PT.

## 2022-06-20 ENCOUNTER — Other Ambulatory Visit: Payer: Self-pay | Admitting: Family Medicine

## 2022-06-25 ENCOUNTER — Encounter: Payer: Self-pay | Admitting: Family Medicine

## 2022-06-25 ENCOUNTER — Ambulatory Visit (INDEPENDENT_AMBULATORY_CARE_PROVIDER_SITE_OTHER): Payer: PPO | Admitting: Family Medicine

## 2022-06-25 ENCOUNTER — Ambulatory Visit (INDEPENDENT_AMBULATORY_CARE_PROVIDER_SITE_OTHER)
Admission: RE | Admit: 2022-06-25 | Discharge: 2022-06-25 | Disposition: A | Discharge status: Home / Self Care | Payer: PPO | Source: Ambulatory Visit | Attending: Family Medicine | Admitting: Family Medicine

## 2022-06-25 VITALS — BP 120/88 | HR 76 | Temp 98.1°F | Ht 70.0 in | Wt 184.8 lb

## 2022-06-25 DIAGNOSIS — M79641 Pain in right hand: Secondary | ICD-10-CM | POA: Diagnosis not present

## 2022-06-25 DIAGNOSIS — I10 Essential (primary) hypertension: Secondary | ICD-10-CM

## 2022-06-25 DIAGNOSIS — M1990 Unspecified osteoarthritis, unspecified site: Secondary | ICD-10-CM

## 2022-06-25 DIAGNOSIS — M79642 Pain in left hand: Secondary | ICD-10-CM | POA: Diagnosis not present

## 2022-06-25 DIAGNOSIS — M19012 Primary osteoarthritis, left shoulder: Secondary | ICD-10-CM | POA: Diagnosis not present

## 2022-06-25 DIAGNOSIS — M160 Bilateral primary osteoarthritis of hip: Secondary | ICD-10-CM | POA: Diagnosis not present

## 2022-06-25 MED ORDER — PREDNISONE 50 MG PO TABS: ORAL_TABLET | ORAL | 0 refills | Status: DC

## 2022-06-25 NOTE — Patient Instructions (Signed)
It was very nice to see you today!  Please start the prednisone.  We will get x-rays of your hips, hands, and shoulders today.  I would like for you to see the physical therapist.  Please let me know if not improving in the next week or so and we can refer you to see sports medicine orthopedics.  Take care, Dr Jimmey Ralph  PLEASE NOTE:  If you had any lab tests please let us know if you have not heard back within a few days. You may see your results on mychart before we have a chance to review them but we will give you a call once they are reviewed by [place]. If we ordered any referrals today, please let us know if you have not heard from their office within the next week.   Please try these tips to maintain a healthy lifestyle:  Eat at least 3 REAL meals and 1-2 snacks per day.  Aim for no more than 5 hours between eating.  If you eat breakfast, please do so within one hour of getting up.   Each meal should contain half fruits/vegetables, one quarter protein, and one quarter carbs (no bigger than a computer mouse)  Cut down on sweet beverages. This includes juice, soda, and sweet tea.   Drink at least 1 glass of water with each meal and aim for at least 8 glasses per day  Exercise at least 150 minutes every week.

## 2022-06-25 NOTE — Assessment & Plan Note (Addendum)
He has not responded as hoped with meloxicam and home exercises.  Given length of symptoms we will check plain films today to further assess underlying osteoarthritis.  We will also place referral for him to see physical therapy.  We will give short prednisone burst as well.  We discussed potential side effects.  He will check with me in the next 5 to 7 days.  If not improving will need referral to orthopedics or sports medicine.

## 2022-06-25 NOTE — Progress Notes (Signed)
   Jim Mathis. is a 71 y.o. male who presents today for an office visit.  Assessment/Plan:  Chronic Problems Addressed Today: Osteoarthritis He has not responded as hoped with meloxicam and home exercises.  Given length of symptoms we will check plain films today to further assess underlying osteoarthritis.  We will also place referral for him to see physical therapy.  We will give short prednisone burst as well.  We discussed potential side effects.  He will check with me in the next 5 to 7 days.  If not improving will need referral to orthopedics or sports medicine.   Essential hypertension At goal today on amlodipine 10 mg daily.     Subjective:  HPI:  Patient here for follow-up.  We saw him about a month ago for bilateral hip pain and hand pain.  There was concern for possible muscular strain and possibly some underlying osteoarthritis.  We started meloxicam and discussed home exercises.  Symptoms have unfortunately not improved.  He thinks symptoms may have worsened a little bit.  Pain has been bothersome and is interfering with his activities of daily living.  He has attempted to do the home exercises but is not sure if it is helped much.  He has also noticed more pain in his left shoulder for the last 4 to 6 weeks as well.  Seems to be worse at night.  Worse with certain motions.  No obvious injuries or precipitating events.        Objective:  Physical Exam: BP 120/88   Pulse 76   Temp 98.1 F (36.7 C) (Temporal)   Ht 5\' 10"  (1.778 m)   Wt 184 lb 12.8 oz (83.8 kg)   SpO2 98%   BMI 26.52 kg/m   Gen: No acute distress, resting comfortably MSK: - Shoulder: No deformities.  Tenderness palpation along the lateral and anterior aspect.  Positive Neer test.  Negative Hawkins test.  No pain with resisted supraspinatus testing.  Normal internal and external rotation.  Neurovascular intact distally - Hands: Mild joint hypertrophy noted throughout. - Hips: No deformities.   Decreased range of motion with internal and external rotation bilaterally.  Neurovascular intact distally.  Neuro: Grossly normal, moves all extremities Psych: Normal affect and thought content      Jim Mathis M. , MD 06/25/2022 2:14 PM

## 2022-06-25 NOTE — Assessment & Plan Note (Signed)
At goal today on amlodipine 10 mg daily. 

## 2022-06-28 ENCOUNTER — Encounter: Payer: Self-pay | Admitting: Family Medicine

## 2022-06-28 NOTE — Progress Notes (Signed)
Please inform patient of the following:  His x-rays did not show anything acute but they do confirm that he has arthritis in all of the joints that we examined.  Hopefully he should have some improvement with prednisone and physical therapy.  He should let us know if not improving and we can refer him to see sports medicine.

## 2022-06-29 ENCOUNTER — Ambulatory Visit: Payer: PPO | Admitting: Family Medicine

## 2022-06-29 NOTE — Telephone Encounter (Signed)
See note

## 2022-06-29 NOTE — Telephone Encounter (Signed)
I appreciate the update. I am glad his pain is improving. Can we check on his PT referral? It was placed at his last office visit.  Katina Degree. Jimmey Ralph, MD 06/29/2022 12:39 PM

## 2022-07-05 ENCOUNTER — Encounter: Payer: Self-pay | Admitting: Family Medicine

## 2022-07-05 NOTE — Telephone Encounter (Signed)
We should avoid long term use of prednisone. He can go back to the meloxicam until he sees PT.  Katina Degree. Jimmey Ralph, MD 07/05/2022 3:38 PM

## 2022-07-05 NOTE — Telephone Encounter (Signed)
Please advise 

## 2022-07-05 NOTE — Telephone Encounter (Signed)
See note

## 2022-07-14 ENCOUNTER — Encounter: Payer: Self-pay | Admitting: Family Medicine

## 2022-07-14 NOTE — Telephone Encounter (Signed)
See note

## 2022-07-15 NOTE — Telephone Encounter (Signed)
Sounds like it could be carpal tunnel syndrome or some other form of pinched nerve. HE can schedule an appointment here to discuss or we can refer him to the sports medicine clinic.  Katina Degree. Jimmey Ralph, MD 07/15/2022 12:52 PM

## 2022-07-19 ENCOUNTER — Telehealth: Payer: Self-pay | Admitting: Family Medicine

## 2022-07-19 ENCOUNTER — Encounter: Payer: Self-pay | Admitting: Family Medicine

## 2022-07-19 ENCOUNTER — Ambulatory Visit (INDEPENDENT_AMBULATORY_CARE_PROVIDER_SITE_OTHER): Payer: PPO | Admitting: Family Medicine

## 2022-07-19 VITALS — BP 133/71 | HR 90 | Temp 97.2°F | Ht 70.0 in | Wt 187.2 lb

## 2022-07-19 DIAGNOSIS — R202 Paresthesia of skin: Secondary | ICD-10-CM

## 2022-07-19 DIAGNOSIS — M1990 Unspecified osteoarthritis, unspecified site: Secondary | ICD-10-CM | POA: Diagnosis not present

## 2022-07-19 MED ORDER — PREDNISONE 50 MG PO TABS: ORAL_TABLET | ORAL | 0 refills | Status: DC

## 2022-07-19 NOTE — Assessment & Plan Note (Signed)
He had significant improvement with prednisone however this is mostly worn off and he is now taking meloxicam with modest benefit.  He has physical therapy scheduled for tomorrow.  We will place referral to sports medicine as above.

## 2022-07-19 NOTE — Assessment & Plan Note (Signed)
Likely has had a flareup of carpal tunnel syndrome.  He did have a nerve conduction study done a few years ago which showed mild carpal tunnel and also ulnar neuropathy.  He does have underlying osteoarthritis which is probably contributing as well.  We will send in another prednisone burst.  We also discussed nighttime splinting.  We will refer him to sports medicine for further evaluation and treatment.

## 2022-07-19 NOTE — Patient Instructions (Signed)
It was very nice to see you today!  I think your carpal tunnel is probably coming back.  We will send in another round of prednisone.  Physical therapy will help with this as well.  I will refer you to see sports medicine.  Take care, Dr Jimmey Ralph  PLEASE NOTE:  If you had any lab tests please let us know if you have not heard back within a few days. You may see your results on mychart before we have a chance to review them but we will give you a call once they are reviewed by [place]. If we ordered any referrals today, please let us know if you have not heard from their office within the next week.   Please try these tips to maintain a healthy lifestyle:  Eat at least 3 REAL meals and 1-2 snacks per day.  Aim for no more than 5 hours between eating.  If you eat breakfast, please do so within one hour of getting up.   Each meal should contain half fruits/vegetables, one quarter protein, and one quarter carbs (no bigger than a computer mouse)  Cut down on sweet beverages. This includes juice, soda, and sweet tea.   Drink at least 1 glass of water with each meal and aim for at least 8 glasses per day  Exercise at least 150 minutes every week.

## 2022-07-19 NOTE — Telephone Encounter (Signed)
FYI, see message. Pt scheduled today.

## 2022-07-19 NOTE — Telephone Encounter (Signed)
FYI--Called patient in regards to VV scheduled via Mychart for possible tingling down arms. Patient clarified that he is not experiencing further symptoms that warrant triage. States "I believe there is an underlying arthritis and pinched nerve problem". Patient's VV has been switched to an OV for further evaluation.

## 2022-07-19 NOTE — Progress Notes (Signed)
   Jim Mathis. is a 71 y.o. male who presents today for an office visit.  Assessment/Plan:  Chronic Problems Addressed Today: Paresthesias Likely has had a flareup of carpal tunnel syndrome.  He did have a nerve conduction study done a few years ago which showed mild carpal tunnel and also ulnar neuropathy.  He does have underlying osteoarthritis which is probably contributing as well.  We will send in another prednisone burst.  We also discussed nighttime splinting.  We will refer him to sports medicine for further evaluation and treatment.  Osteoarthritis He had significant improvement with prednisone however this is mostly worn off and he is now taking meloxicam with modest benefit.  He has physical therapy scheduled for tomorrow.  We will place referral to sports medicine as above.     Subjective:  HPI:  Patient here for follow-up.  We saw him a few months ago for joint pain secondary to arthritis.  He had not responded to a course of meloxicam and home exercises and we started him on a prednisone burst.  He had significant improvement in symptoms after doing this however then started noticing the pain return in his muscles.  Since our last visit he has also had a numbness and tingling sensation in his upper extremities.  It is most severe in the morning and it can take up to an hour to dissipate.  He has had some neck pain. His paresthesias are more predominant in his left hand but seem to start developing in his right arm the last several days. HE has been taking meloxicam  but he is not sure if it is helping.       Objective:  Physical Exam: BP 133/71   Pulse 90   Temp (!) 97.2 F (36.2 C) (Temporal)   Ht 5\' 10"  (1.778 m)   Wt 187 lb 3.2 oz (84.9 kg)   SpO2 98%   BMI 26.86 kg/m   Gen: No acute distress, resting comfortably CV: Regular rate and rhythm with no murmurs appreciated Pulm: Normal work of breathing, clear to auscultation bilaterally with no crackles, wheezes,  or rhonchi MSK:  - NecK : No deformities.  Slightly decreased range of motion to the left secondary to pain.  Spurling test negative bilaterally.  Neurovascular intact distally. - Arms: Tinel sign positive at wrist bilaterally.  Limited ability to close fist bilaterally secondary to pain and swelling.  Neurovascular intact distally. Neuro: Grossly normal, moves all extremities Psych: Normal affect and thought content      Kaytlynn Kochan M. , MD 07/19/2022 3:23 PM

## 2022-07-20 ENCOUNTER — Ambulatory Visit: Payer: PPO | Attending: Family Medicine | Admitting: Physical Therapy

## 2022-07-20 ENCOUNTER — Encounter: Payer: Self-pay | Admitting: Physical Therapy

## 2022-07-20 DIAGNOSIS — M25512 Pain in left shoulder: Secondary | ICD-10-CM | POA: Diagnosis not present

## 2022-07-20 DIAGNOSIS — M25551 Pain in right hip: Secondary | ICD-10-CM | POA: Insufficient documentation

## 2022-07-20 DIAGNOSIS — M1990 Unspecified osteoarthritis, unspecified site: Secondary | ICD-10-CM | POA: Diagnosis not present

## 2022-07-20 NOTE — Therapy (Signed)
OUTPATIENT PHYSICAL THERAPY THORACOLUMBAR EVALUATION   Patient Name: Jim Mathis. MRN: [id number] DOB:[birth year], 71 y.o., male Today's Date: 07/20/2022    Past Medical History:  Diagnosis Date   BPH with obstruction/lower urinary tract symptoms    Carotid stenosis, right    per pt was told he has some stenosis but not enough for surgery;   in epic under media ,  carotid ultrasound result right ICA 50-69% and incidental finding thyroid nodule   Cervicalgia    ED (erectile dysfunction)    History of colon resection    per pt 12 hours after partial knee arthroplasty 2017,  s/p partial colectomy was possible from divertiulitis   Hyperlipidemia    Hypertension    Migraines    PAD (peripheral artery disease) (HCC) 08-14-2019  per pt when he walks (walks 2 miles a day) right foot gets numb but recovers quickly and when he rides his bike left hand gets numb by recovers quickly ;   denies claudication or swelling   per pt s/p  stenting to bilateral lower legs in [place] one in 2003 and the other 2007,  no follow up since moved to [place]   Wears glasses    Past Surgical History:  Procedure Laterality Date   COLOSTOMY REVERSAL  02-21-2017     [place], [place] (OR record scanned in epic)   w/ sigmoid and descending colectomy with appendectomy   CYSTOSCOPY WITH INJECTION N/A 12/19/2019   Procedure: CYSTOSCOPY WITH INJECTION;  Surgeon: Sebastian Ache, MD;  Location: [facility];  Service: Urology;  Laterality: N/A;   CYSTOSCOPY WITH URETHRAL DILATATION N/A 12/19/2019   Procedure: CYSTOSCOPY WITH URETHRAL DILATATION;  Surgeon: Sebastian Ache, MD;  Location: [facility];  Service: Urology;  Laterality: N/A;  45 MINS   ENDOVASCULAR STENT INSERTION  2003 and 2007  --- both done in [place]   bilateral legs   PARTIAL COLECTOMY  09-04-2016  in [place], [place]   w/  colostomy for perferated divierticulitis   PARTIAL KNEE ARTHROPLASTY Left 2017   TOTAL KNEE ARTHROPLASTY Right  2012   TRANSURETHRAL RESECTION OF PROSTATE N/A 08/15/2019   Procedure: TRANSURETHRAL RESECTION OF THE PROSTATE (TURP);  Surgeon: Sebastian Ache, MD;  Location: [facility];  Service: Urology;  Laterality: N/A;   Patient Active Problem List   Diagnosis Date Noted   Paresthesias 07/19/2022   SBO (small bowel obstruction) (HCC) 05/08/2021   PAD (peripheral artery disease) (HCC)    Actinic keratosis 05/06/2020   Nonintractable headache 08/22/2019   BPH with ED and OAB 05/24/2019   Essential hypertension 05/24/2019   Dyslipidemia 05/24/2019   Anxiety 05/24/2019   Seborrheic dermatitis 05/24/2019   Osteoarthritis 07/13/2012    PCP: Jacquiline Doe MD  REFERRING PROVIDER: Jacquiline Doe MD  REFERRING DIAG: R hip pain/paresthesia   Rationale for Evaluation and Treatment Rehabilitation  THERAPY DIAG:  No diagnosis found.  ONSET DATE: Late May/Early June  SUBJECTIVE:  SUBJECTIVE STATEMENT: Pt presents with hip flexor pain. Pain had resolved with predisone taper, worsened without. Reports known OA of R hip and bilat shoulders that are also giving him pain   PERTINENT HISTORY:  Pt is a 71 year old male presenting with L hip pain (pt reports is groin pain), denies n/t. Pt had L TKA 12 years ago, reports he doesn't think he has full mobility with this contributing to hip pain. Hip pain began in late May/early June. Had a predisone bout 2x since and this most recent bout a couple weeks ago helped pain tremendously, returned following. Pain is aggravated by active bending such as car transfers, transferring from the floor, sit to stand, and lifting leg to put on socks and shoes. Reports pain is 0/10 currently and at best; worst 4/10. Describes this as sharp, coming on and going quickly. No popping or  clicking. Pt is retired, enjoys hiking and biking. Road biking or hiking on level trails 3x/week, before hip pain was biking 3x/week and hiking 2x/week. Pt lives with wife in duplex with no stairs to enter or inside. Pt has bilat shoulder pain as well that has come on at the same time, with some carpel tunnel symptoms.   PAIN:  Are you having pain? Yes: NPRS scale: 0/10 Pain location: R hip flexor Pain description: sharp, quick Aggravating factors: floor and chair transfers, lifting leg for car transfer and to don/doff socks/shoes Relieving factors: rest   PRECAUTIONS: None  WEIGHT BEARING RESTRICTIONS No  FALLS:  Has patient fallen in last 6 months? No  LIVING ENVIRONMENT: Lives with: lives with their spouse Lives in: House/apartment Stairs: No Has following equipment at home: None  OCCUPATION: Retired  PLOF: Independent  PATIENT GOALS being able to get something off the floor, and put my socks on   OBJECTIVE:   DIAGNOSTIC FINDINGS:  Xray negative  PATIENT SURVEYS:  FOTO 49 goal 28  SCREENING FOR RED FLAGS: Bowel or bladder incontinence: No Spinal tumors: No Cauda equina syndrome: No Compression fracture: No Abdominal aneurysm: No  COGNITION:  Overall cognitive status: Within functional limits for tasks assessed     SENSATION: WFL  MUSCLE LENGTH: Hamstrings: Shortened bilat between 50-75% mobility; less mobility R hamstring Elys R approx 90d L approx 100d Thomas test: Right shortened 50% deg; Left shortened 25%  POSTURE: anterior pelvic tilt and lower crossed   PALPATION: Tension to lumbar paraspinals with trigger points bilat Concordant pain to iliopsoas insertion   LUMBAR ROM:   Active  A/PROM  eval  Flexion WNL with concordant pain  Extension ~10% limited  Right lateral flexion WNL  Left lateral flexion WNL  Right rotation WNL  Left rotation WLN   (Blank rows = not tested)  LOWER EXTREMITY ROM:     Passive  Right eval Left eval  Hip  flexion 114 114  Hip extension 15 25  Hip abduction wnl wnl  Hip adduction wnl wnl  Hip internal rotation 30 44  Hip external rotation wnl wnl  Knee flexion 115 130  Knee extension 0 0  Ankle dorsiflexion 12 14  Ankle plantarflexion wnl wnl     (Blank rows = not tested)  ACTIVE Right eval Left eval  Hip flexion 104 114  Hip extension 10 10  Hip abduction wnl wnl  Hip adduction wnl wnl  Hip internal rotation 30 44  Hip external rotation WNL WNL  Knee flexion 115 128  Knee extension 5 5  Ankle dorsiflexion 10 10  Ankle  plantarflexion WNL WNL   (Blank rows = not tested)  LOWER EXTREMITY MMT:    MMT Right eval Left eval  Hip flexion 4+ 5  Hip extension 4- 4-  Hip abduction 4+ 4+  Hip adduction 5 5  Hip internal rotation 5 5  Hip external rotation 4+ 4+  Knee flexion 4 4+  Knee extension 5 5  Ankle dorsiflexion 5 5  Ankle plantarflexion 5 5  Ankle inversion NT NT  Ankle eversion NT NT   (Blank rows = not tested)  **All MMTs measured within available range 5xSTS 10sec Hip flex 1RM L: 65# R 45# Knee flex 1RM L: 35# R 25#  LUMBAR SPECIAL TESTS:  Straight leg raise test: Negative, FABER test: Negative, and Thomas test: Positive  FUNCTIONAL TESTS:  5 times sit to stand: 10sec 10 meter walk test: self selected: 1.71m/s; fastest 1.64m/s Squat: to parallel with bilat heel raise, ant pelvic tilt, reports unable to go lower feels limited by R hip   GAIT: Distance walked: 50ft Assistive device utilized: None Level of assistance: Complete Independence Comments: decreased terminal hip ext bilat, maintained lower crossed throughout gait, decreased trunk rotation     TODAY'S TREATMENT  PT reviewed the following HEP with patient with patient able to demonstrate a set of the following with min cuing for correction needed. PT educated patient on parameters of therex (how/when to inc/decrease intensity, frequency, rep/set range, stretch hold time, and purpose of therex)  with verbalized understanding.  Access Code: [email or web address] - Modified Thomas Stretch  - 2-3 x daily - 7 x weekly - 30sec hold - Prone Hip Extension  - 1 x daily - 7 x weekly - 2 sets - 12 reps  Education on muscle lengthening/strengthening for corrective posture from lower crossed syndrome with visual aid and understanding   PATIENT EDUCATION:  Education details: Patient was educated on diagnosis, anatomy and pathology involved, prognosis, role of PT, and was given an HEP, demonstrating exercise with proper form following verbal and tactile cues, and was given a paper hand out to continue exercise at home. Pt was educated on and agreed to plan of care.  Person educated: Patient Education method: Explanation, Demonstration, Verbal cues, and Handouts Education comprehension: verbalized understanding, returned demonstration, and verbal cues required   HOME EXERCISE PROGRAM: [email or web address]  ASSESSMENT:  CLINICAL IMPRESSION: Patient is a 71 y.o. male who was seen today for physical therapy evaluation and treatment for R hip pain. Pt with signs and symptoms of R iliopsoas tendinitis. Impairments in lower crossed posture, decreased activation and strength of R hip flexor, hamstring, glute max, and core musculature, decreased power as evidenced in 1RM difference R to L, muscle tension of iliopsoas and lumbar paraspinals, gait abnormalities, decreased motor control of functional transfers and squat, and pain. Activity limitations in biking, squatting, retrieving objects from floor, donning/doffing shoes and socks, floor to stand transfer, car and STS transfer;inhibiting full participation in functional ADLs. Would benefit from skilled PT to address above deficits and promote optimal return to PLOF.    OBJECTIVE IMPAIRMENTS Abnormal gait, decreased activity tolerance, decreased coordination, decreased endurance, decreased mobility, difficulty walking, decreased ROM, decreased strength, hypomobility, increased  fascial restrictions, increased muscle spasms, impaired flexibility, impaired tone, improper body mechanics, postural dysfunction, and pain.   ACTIVITY LIMITATIONS lifting, bending, squatting, stairs, transfers, and biking  PARTICIPATION LIMITATIONS: cleaning, driving, community activity, and yard work  PERSONAL FACTORS Past/current experiences, Time since onset of injury/illness/exacerbation, and 3+ comorbidities: PAD, OA, anxiety  are also affecting  patient's functional outcome.   REHAB POTENTIAL: Good  CLINICAL DECISION MAKING: Evolving/moderate complexity  EVALUATION COMPLEXITY: Moderate   GOALS: Goals reviewed with patient? Yes  SHORT TERM GOALS: Target date: 08/17/2022  Pt will be independent with HEP in order to improve strength and balance in order to decrease fall risk and improve function at home and work. Baseline: 07/20/22 HEP given Goal status: INITIAL   LONG TERM GOALS: Target date: 09/14/2022  Patient will increase FOTO score to 63 to demonstrate predicted increase in functional mobility to complete ADLs Baseline: 07/20/22 49 Goal status: INITIAL  2.  Pt will demonstrate 1RM hamstring curl of RLE to at least 31.5# in order to demonstrate at least 90% of PLOF, CLLE in order to complete heavy ADLs Baseline: L: 35# R 25# Goal status: INITIAL  3.  Pt will demonstrate 1RM leg press of RLE to at least 58.5# in order to demonstrate at least 90% of PLOF, CLLE in order to complete heavy ADLs Baseline: 07/20/22 L: 65# R 45# Goal status: INITIAL  4.  Pt will decrease worst pain as reported on NPRS by at least 3 points in order to demonstrate clinically significant reduction in pain. Baseline: 07/20/22 4/10 Goal status: INITIAL    PLAN: PT FREQUENCY: 1-2x/week  PT DURATION: 8 weeks  PLANNED INTERVENTIONS: Therapeutic exercises, Therapeutic activity, Neuromuscular re-education, Balance training, Gait training, Patient/Family education, Self Care, Joint mobilization, Joint  manipulation, Stair training, Aquatic Therapy, Dry Needling, Electrical stimulation, Spinal manipulation, Spinal mobilization, Cryotherapy, Moist heat, Taping, Ultrasound, Manual therapy, and Re-evaluation.  PLAN FOR NEXT SESSION: stair assessment, further squat assessment with look at ankle mobility, HEP review  Hilda Lias DPT Hilda Lias, PT 07/20/2022, 11:15 AM

## 2022-07-21 ENCOUNTER — Other Ambulatory Visit: Payer: Self-pay | Admitting: Family Medicine

## 2022-07-26 NOTE — Progress Notes (Unsigned)
   I, [name], LAT, ATC acting as a scribe for [name], MD.  Subjective:    CC: wrist pain?  HPI: Pt is a 71 y/o male c/o ???Pt was seen by PCP on 07/19/22 and ws prescribed prednisone and was to wear night splints. Pt locates pain to   Grip strength: Numbness/tingling: Aggravates: Treatments tried:  Dx testing: 06/25/22 R & L hand XR, bilat hips and L shoulder XR  07/03/19 NCV study  Pertinent review of Systems: ***  Relevant historical information: ***   Objective:   There were no vitals filed for this visit. General: Well Developed, well nourished, and in no acute distress.   MSK: ***  Lab and Radiology Results No results found for this or any previous visit (from the past 72 hour(s)). No results found.    Impression and Recommendations:    Assessment and Plan: 71 y.o. male with ***.  PDMP not reviewed this encounter. No orders of the defined types were placed in this encounter.  No orders of the defined types were placed in this encounter.   Discussed warning signs or symptoms. Please see discharge instructions. Patient expresses understanding.   ***

## 2022-07-27 ENCOUNTER — Encounter: Payer: Self-pay | Admitting: Physical Therapy

## 2022-07-27 ENCOUNTER — Ambulatory Visit (INDEPENDENT_AMBULATORY_CARE_PROVIDER_SITE_OTHER): Payer: PPO | Admitting: Family Medicine

## 2022-07-27 ENCOUNTER — Ambulatory Visit: Payer: Self-pay

## 2022-07-27 ENCOUNTER — Ambulatory Visit: Payer: PPO | Admitting: Physical Therapy

## 2022-07-27 VITALS — BP 150/88 | HR 93 | Ht 70.0 in | Wt 187.4 lb

## 2022-07-27 DIAGNOSIS — G8929 Other chronic pain: Secondary | ICD-10-CM

## 2022-07-27 DIAGNOSIS — M25551 Pain in right hip: Secondary | ICD-10-CM

## 2022-07-27 DIAGNOSIS — M25512 Pain in left shoulder: Secondary | ICD-10-CM | POA: Diagnosis not present

## 2022-07-27 DIAGNOSIS — G5602 Carpal tunnel syndrome, left upper limb: Secondary | ICD-10-CM | POA: Diagnosis not present

## 2022-07-27 NOTE — Patient Instructions (Addendum)
Thank you for coming in today.   I've referred you to Physical Therapy.  Let us know if you don't hear from them in one week.   Wear the night wrist splints  Check back in 6 weeks

## 2022-07-27 NOTE — Therapy (Signed)
OUTPATIENT PHYSICAL THERAPY TREATMENT NOTE   Patient Name: Jim Mathis. MRN: [id number] DOB:Oct 25, 1951, 71 y.o., male Today's Date: 07/27/2022  PCP: Jacquiline Doe MD REFERRING PROVIDER: Jacquiline Doe MD  END OF SESSION:   PT End of Session - 07/27/22 1950     Visit Number 2    Number of Visits 17    Date for PT Re-Evaluation 09/17/22    Authorization - Visit Number 3    Authorization - Number of Visits 10    Progress Note Due on Visit 10    PT Start Time 0919    PT Stop Time 1000    PT Time Calculation (min) 41 min    Activity Tolerance Patient tolerated treatment well    Behavior During Therapy [facility] for tasks assessed/performed             Past Medical History:  Diagnosis Date   BPH with obstruction/lower urinary tract symptoms    Carotid stenosis, right    per pt was told he has some stenosis but not enough for surgery;   in epic under media ,  carotid ultrasound result right ICA 50-69% and incidental finding thyroid nodule   Cervicalgia    ED (erectile dysfunction)    History of colon resection    per pt 12 hours after partial knee arthroplasty 2017,  s/p partial colectomy was possible from divertiulitis   Hyperlipidemia    Hypertension    Migraines    PAD (peripheral artery disease) (HCC) 08-14-2019  per pt when he walks (walks 2 miles a day) right foot gets numb but recovers quickly and when he rides his bike left hand gets numb by recovers quickly ;   denies claudication or swelling   per pt s/p  stenting to bilateral lower legs in [place] one in 2003 and the other 2007,  no follow up since moved to [facility]   Wears glasses    Past Surgical History:  Procedure Laterality Date   COLOSTOMY REVERSAL  02-21-2017     [place], [place] (OR record scanned in epic)   w/ sigmoid and descending colectomy with appendectomy   CYSTOSCOPY WITH INJECTION N/A 12/19/2019   Procedure: CYSTOSCOPY WITH INJECTION;  Surgeon: Sebastian Ache, MD;  Location: [facility];   Service: Urology;  Laterality: N/A;   CYSTOSCOPY WITH URETHRAL DILATATION N/A 12/19/2019   Procedure: CYSTOSCOPY WITH URETHRAL DILATATION;  Surgeon: Sebastian Ache, MD;  Location: [facility];  Service: Urology;  Laterality: N/A;  45 MINS   ENDOVASCULAR STENT INSERTION  2003 and 2007  --- both done in [place]   bilateral legs   PARTIAL COLECTOMY  09-04-2016  in [place], [place]   w/  colostomy for perferated divierticulitis   PARTIAL KNEE ARTHROPLASTY Left 2017   TOTAL KNEE ARTHROPLASTY Right 2012   TRANSURETHRAL RESECTION OF PROSTATE N/A 08/15/2019   Procedure: TRANSURETHRAL RESECTION OF THE PROSTATE (TURP);  Surgeon: Sebastian Ache, MD;  Location: [facility];  Service: Urology;  Laterality: N/A;   Patient Active Problem List   Diagnosis Date Noted   Paresthesias 07/19/2022   SBO (small bowel obstruction) (HCC) 05/08/2021   PAD (peripheral artery disease) (HCC)    Actinic keratosis 05/06/2020   Nonintractable headache 08/22/2019   BPH with ED and OAB 05/24/2019   Essential hypertension 05/24/2019   Dyslipidemia 05/24/2019   Anxiety 05/24/2019   Seborrheic dermatitis 05/24/2019   Osteoarthritis 07/13/2012    REFERRING DIAG: R hip pain   THERAPY DIAG:  Pain in  right hip  Rationale for Evaluation and Treatment Rehabilitation  PERTINENT HISTORY: PERTINENT HISTORY:  Pt is a 71 year old male presenting with R hip pain (pt reports is groin pain), denies n/t. Pt had R TKA 12 years ago, reports he doesn't think he has full mobility with this contributing to hip pain. Hip pain began in late May/early June. Had a predisone bout 2x since and this most recent bout a couple weeks ago helped pain tremendously, returned following. Pain is aggravated by active bending such as car transfers, transferring from the floor, sit to stand, and lifting leg to put on socks and shoes. Reports pain is 0/10 currently and at best; worst 4/10. Describes this as sharp, coming on and  going quickly. No popping or clicking. Pt is retired, enjoys hiking and biking. Road biking or hiking on level trails 3x/week, before hip pain was biking 3x/week and hiking 2x/week. Pt lives with wife in duplex with no stairs to enter or inside. Pt has bilat shoulder pain as well that has come on at the same time, with some carpel tunnel symptoms.   PRECAUTIONS: none  SUBJECTIVE: Pt reports 2/10 R hip pain today. Is enjoying his HEP stretch, having difficulty with prone hip ext. Had company this weekend and did not complete as often as prescribed.  PAIN:  Are you having pain? Yes: NPRS scale: 2/10 Pain location: R hip    OBJECTIVE: (objective measures completed at initial evaluation unless otherwise dated)   DIAGNOSTIC FINDINGS:  Xray negative   PATIENT SURVEYS:  FOTO 49 goal 20   SCREENING FOR RED FLAGS: Bowel or bladder incontinence: No Spinal tumors: No Cauda equina syndrome: No Compression fracture: No Abdominal aneurysm: No   COGNITION:           Overall cognitive status: Within functional limits for tasks assessed                          SENSATION: WFL   MUSCLE LENGTH: Hamstrings: Shortened bilat between 50-75% mobility; less mobility R hamstring Elys R approx 90d L approx 100d Thomas test: Right shortened 50% deg; Left shortened 25%   POSTURE: anterior pelvic tilt and lower crossed     PALPATION: Tension to lumbar paraspinals with trigger points bilat Concordant pain to iliopsoas insertion     LUMBAR ROM:    Active  A/PROM  eval  Flexion WNL with concordant pain  Extension ~10% limited  Right lateral flexion WNL  Left lateral flexion WNL  Right rotation WNL  Left rotation WLN   (Blank rows = not tested)   LOWER EXTREMITY ROM:      Passive  Right eval Left eval  Hip flexion 114 114  Hip extension 15 25  Hip abduction wnl wnl  Hip adduction wnl wnl  Hip internal rotation 30 44  Hip external rotation wnl wnl  Knee flexion 115 130  Knee  extension 0 0  Ankle dorsiflexion 12 14  Ankle plantarflexion wnl wnl      (Blank rows = not tested)   ACTIVE Right eval Left eval  Hip flexion 104 114  Hip extension 10 10  Hip abduction wnl wnl  Hip adduction wnl wnl  Hip internal rotation 30 44  Hip external rotation WNL WNL  Knee flexion 115 128  Knee extension 5 5  Ankle dorsiflexion 10 10  Ankle plantarflexion WNL WNL   (Blank rows = not tested)   LOWER EXTREMITY MMT:  MMT Right eval Left eval  Hip flexion 4+ 5  Hip extension 4- 4-  Hip abduction 4+ 4+  Hip adduction 5 5  Hip internal rotation 5 5  Hip external rotation 4+ 4+  Knee flexion 4 4+  Knee extension 5 5  Ankle dorsiflexion 5 5  Ankle plantarflexion 5 5  Ankle inversion NT NT  Ankle eversion NT NT   (Blank rows = not tested)   **All MMTs measured within available range 5xSTS 10sec Hip flex 1RM L: 65# R 45# Knee flex 1RM L: 35# R 25#   LUMBAR SPECIAL TESTS:  Straight leg raise test: Negative, FABER test: Negative, and Thomas test: Positive   FUNCTIONAL TESTS:  5 times sit to stand: 10sec 10 meter walk test: self selected: 1.41m/s; fastest 1.10m/s Squat: to parallel with bilat heel raise, ant pelvic tilt, reports unable to go lower feels limited by R hip     GAIT: Distance walked: 7ft Assistive device utilized: None Level of assistance: Complete Independence Comments: decreased terminal hip ext bilat, maintained lower crossed throughout gait, decreased trunk rotation        TODAY'S TREATMENT  Nustep L3 seat 9 UE 13 for gentle movement and strengthening  Half kneeling with overhead reach stretch 2sec hold x12  TA activation hooklying x12 with pt able to demonstrate excellent carry over Bridge 2x 10 with carry over of TA activaiton in prior exercise Hip hinge with dowel at occiput and sacrum x12; with dowel in front (attempted initially unable to complete this way);  Straight leg deadlift 10# DB in each hand with cuing  throughout OMEGA leg press 2x 10 25#it min cuing to push through heel [employer] 30sec     PATIENT EDUCATION:  Education details: Patient was educated on diagnosis, anatomy and pathology involved, prognosis, role of PT, and was given an HEP, demonstrating exercise with proper form following verbal and tactile cues, and was given a paper hand out to continue exercise at home. Pt was educated on and agreed to plan of care.   Person educated: Patient Education method: Explanation, Demonstration, Verbal cues, and Handouts Education comprehension: verbalized understanding, returned demonstration, and verbal cues required     HOME EXERCISE PROGRAM: [id number]   ASSESSMENT:   CLINICAL IMPRESSION: PT initiated therex progression for increased hip and low back mobility and core strengthening with success. Pt is able to comply with all cuing for proper technique of therex with excellent effort throughout session. Pt with difficulty with hip ext d/t ant tension of hip flexors with often attempts at low back ext/ant pelvic tilt that he is somewhat able to be cued out of. PT will continue progression as able.        OBJECTIVE IMPAIRMENTS Abnormal gait, decreased activity tolerance, decreased coordination, decreased endurance, decreased mobility, difficulty walking, decreased ROM, decreased strength, hypomobility, increased fascial restrictions, increased muscle spasms, impaired flexibility, impaired tone, improper body mechanics, postural dysfunction, and pain.    ACTIVITY LIMITATIONS lifting, bending, squatting, stairs, transfers, and biking   PARTICIPATION LIMITATIONS: cleaning, driving, community activity, and yard work   PERSONAL FACTORS Past/current experiences, Time since onset of injury/illness/exacerbation, and 3+ comorbidities: PAD, OA, anxiety  are also affecting patient's functional outcome.    REHAB POTENTIAL: Good   CLINICAL DECISION MAKING: Evolving/moderate complexity    EVALUATION COMPLEXITY: Moderate     GOALS: Goals reviewed with patient? Yes   SHORT TERM GOALS: Target date: 08/17/2022   Pt will be independent with HEP in order to improve  strength and balance in order to decrease fall risk and improve function at home and work. Baseline: 07/20/22 HEP given Goal status: INITIAL     LONG TERM GOALS: Target date: 09/14/2022   Patient will increase FOTO score to 63 to demonstrate predicted increase in functional mobility to complete ADLs Baseline: 07/20/22 49 Goal status: INITIAL   2.  Pt will demonstrate 1RM hamstring curl of RLE to at least 31.5# in order to demonstrate at least 90% of PLOF, CLLE in order to complete heavy ADLs Baseline: L: 35# R 25# Goal status: INITIAL   3.  Pt will demonstrate 1RM leg press of RLE to at least 58.5# in order to demonstrate at least 90% of PLOF, CLLE in order to complete heavy ADLs Baseline: 07/20/22 L: 65# R 45# Goal status: INITIAL   4.  Pt will decrease worst pain as reported on NPRS by at least 3 points in order to demonstrate clinically significant reduction in pain. Baseline: 07/20/22 4/10 Goal status: INITIAL       PLAN: PT FREQUENCY: 1-2x/week   PT DURATION: 8 weeks   PLANNED INTERVENTIONS: Therapeutic exercises, Therapeutic activity, Neuromuscular re-education, Balance training, Gait training, Patient/Family education, Self Care, Joint mobilization, Joint manipulation, Stair training, Aquatic Therapy, Dry Needling, Electrical stimulation, Spinal manipulation, Spinal mobilization, Cryotherapy, Moist heat, Taping, Ultrasound, Manual therapy, and Re-evaluation.   PLAN FOR NEXT SESSION: stair assessment, further squat assessment with look at ankle mobility, HEP review     Hilda Lias DPT  Hilda Lias, PT 07/27/2022, 1:11 PM

## 2022-07-29 ENCOUNTER — Encounter: Payer: Self-pay | Admitting: Physical Therapy

## 2022-07-29 ENCOUNTER — Ambulatory Visit: Payer: PPO | Admitting: Physical Therapy

## 2022-07-29 DIAGNOSIS — M25551 Pain in right hip: Secondary | ICD-10-CM | POA: Diagnosis not present

## 2022-07-29 NOTE — Therapy (Signed)
OUTPATIENT PHYSICAL THERAPY TREATMENT NOTE   Patient Name: Jim Mathis. MRN: [id number] DOB:Jul 20, 1951, 71 y.o., male Today's Date: 07/29/2022  PCP: Jacquiline Doe MD REFERRING PROVIDER: Jacquiline Doe MD  END OF SESSION:   PT End of Session - 07/29/22 1052     Visit Number 3    Number of Visits 17    Date for PT Re-Evaluation 09/17/22    Authorization - Visit Number 3    Authorization - Number of Visits 10    Progress Note Due on Visit 10    PT Start Time 1049    PT Stop Time 1128    PT Time Calculation (min) 39 min    Activity Tolerance Patient tolerated treatment well    Behavior During Therapy WFL for tasks assessed/performed              Past Medical History:  Diagnosis Date   BPH with obstruction/lower urinary tract symptoms    Carotid stenosis, right    per pt was told he has some stenosis but not enough for surgery;   in epic under media ,  carotid ultrasound result right ICA 50-69% and incidental finding thyroid nodule   Cervicalgia    ED (erectile dysfunction)    History of colon resection    per pt 12 hours after partial knee arthroplasty 2017,  s/p partial colectomy was possible from divertiulitis   Hyperlipidemia    Hypertension    Migraines    PAD (peripheral artery disease) (HCC) 08-14-2019  per pt when he walks (walks 2 miles a day) right foot gets numb but recovers quickly and when he rides his bike left hand gets numb by recovers quickly ;   denies claudication or swelling   per pt s/p  stenting to bilateral lower legs in [place] one in 2003 and the other 2007,  no follow up since moved to [facility]   Wears glasses    Past Surgical History:  Procedure Laterality Date   COLOSTOMY REVERSAL  02-21-2017     [place], [place] (OR record scanned in epic)   w/ sigmoid and descending colectomy with appendectomy   CYSTOSCOPY WITH INJECTION N/A 12/19/2019   Procedure: CYSTOSCOPY WITH INJECTION;  Surgeon: Sebastian Ache, MD;  Location: [facility];   Service: Urology;  Laterality: N/A;   CYSTOSCOPY WITH URETHRAL DILATATION N/A 12/19/2019   Procedure: CYSTOSCOPY WITH URETHRAL DILATATION;  Surgeon: Sebastian Ache, MD;  Location: [facility];  Service: Urology;  Laterality: N/A;  45 MINS   ENDOVASCULAR STENT INSERTION  2003 and 2007  --- both done in [place]   bilateral legs   PARTIAL COLECTOMY  09-04-2016  in [place], [place]   w/  colostomy for perferated divierticulitis   PARTIAL KNEE ARTHROPLASTY Left 2017   TOTAL KNEE ARTHROPLASTY Right 2012   TRANSURETHRAL RESECTION OF PROSTATE N/A 08/15/2019   Procedure: TRANSURETHRAL RESECTION OF THE PROSTATE (TURP);  Surgeon: Sebastian Ache, MD;  Location: [facility];  Service: Urology;  Laterality: N/A;   Patient Active Problem List   Diagnosis Date Noted   Paresthesias 07/19/2022   SBO (small bowel obstruction) (HCC) 05/08/2021   PAD (peripheral artery disease) (HCC)    Actinic keratosis 05/06/2020   Nonintractable headache 08/22/2019   BPH with ED and OAB 05/24/2019   Essential hypertension 05/24/2019   Dyslipidemia 05/24/2019   Anxiety 05/24/2019   Seborrheic dermatitis 05/24/2019   Osteoarthritis 07/13/2012    REFERRING DIAG: R hip pain   THERAPY DIAG:  Pain  in right hip  Rationale for Evaluation and Treatment Rehabilitation  PERTINENT HISTORY: PERTINENT HISTORY:  Pt is a 71 year old male presenting with R hip pain (pt reports is groin pain), denies n/t. Pt had R TKA 12 years ago, reports he doesn't think he has full mobility with this contributing to hip pain. Hip pain began in late May/early June. Had a predisone bout 2x since and this most recent bout a couple weeks ago helped pain tremendously, returned following. Pain is aggravated by active bending such as car transfers, transferring from the floor, sit to stand, and lifting leg to put on socks and shoes. Reports pain is 0/10 currently and at best; worst 4/10. Describes this as sharp, coming on and  going quickly. No popping or clicking. Pt is retired, enjoys hiking and biking. Road biking or hiking on level trails 3x/week, before hip pain was biking 3x/week and hiking 2x/week. Pt lives with wife in duplex with no stairs to enter or inside. Pt has bilat shoulder pain as well that has come on at the same time, with some carpel tunnel symptoms.   PRECAUTIONS: none  SUBJECTIVE: Pt reports doing well overall. Minimal pain 2/10, reports compliance with HEP.   PAIN:  Are you having pain? Yes: NPRS scale: 2/10 Pain location: R hip    OBJECTIVE: (objective measures completed at initial evaluation unless otherwise dated)   DIAGNOSTIC FINDINGS:  Xray negative   PATIENT SURVEYS:  FOTO 49 goal 19   SCREENING FOR RED FLAGS: Bowel or bladder incontinence: No Spinal tumors: No Cauda equina syndrome: No Compression fracture: No Abdominal aneurysm: No   COGNITION:           Overall cognitive status: Within functional limits for tasks assessed                          SENSATION: WFL   MUSCLE LENGTH: Hamstrings: Shortened bilat between 50-75% mobility; less mobility R hamstring Elys R approx 90d L approx 100d Thomas test: Right shortened 50% deg; Left shortened 25%   POSTURE: anterior pelvic tilt and lower crossed     PALPATION: Tension to lumbar paraspinals with trigger points bilat Concordant pain to iliopsoas insertion     LUMBAR ROM:    Active  A/PROM  eval  Flexion WNL with concordant pain  Extension ~10% limited  Right lateral flexion WNL  Left lateral flexion WNL  Right rotation WNL  Left rotation WLN   (Blank rows = not tested)   LOWER EXTREMITY ROM:      Passive  Right eval Left eval  Hip flexion 114 114  Hip extension 15 25  Hip abduction wnl wnl  Hip adduction wnl wnl  Hip internal rotation 30 44  Hip external rotation wnl wnl  Knee flexion 115 130  Knee extension 0 0  Ankle dorsiflexion 12 14  Ankle plantarflexion wnl wnl      (Blank rows = not  tested)   ACTIVE Right eval Left eval  Hip flexion 104 114  Hip extension 10 10  Hip abduction wnl wnl  Hip adduction wnl wnl  Hip internal rotation 30 44  Hip external rotation WNL WNL  Knee flexion 115 128  Knee extension 5 5  Ankle dorsiflexion 10 10  Ankle plantarflexion WNL WNL   (Blank rows = not tested)   LOWER EXTREMITY MMT:     MMT Right eval Left eval  Hip flexion 4+ 5  Hip extension 4-  4-  Hip abduction 4+ 4+  Hip adduction 5 5  Hip internal rotation 5 5  Hip external rotation 4+ 4+  Knee flexion 4 4+  Knee extension 5 5  Ankle dorsiflexion 5 5  Ankle plantarflexion 5 5  Ankle inversion NT NT  Ankle eversion NT NT   (Blank rows = not tested)   **All MMTs measured within available range 5xSTS 10sec Hip flex 1RM L: 65# R 45# Knee flex 1RM L: 35# R 25#   LUMBAR SPECIAL TESTS:  Straight leg raise test: Negative, FABER test: Negative, and Thomas test: Positive   FUNCTIONAL TESTS:  5 times sit to stand: 10sec 10 meter walk test: self selected: 1.45m/s; fastest 1.16m/s Squat: to parallel with bilat heel raise, ant pelvic tilt, reports unable to go lower feels limited by R hip     GAIT: Distance walked: 106ft Assistive device utilized: None Level of assistance: Complete Independence Comments: decreased terminal hip ext bilat, maintained lower crossed throughout gait, decreased trunk rotation        TODAY'S TREATMENT  Nustep L3 seat 9 UE 13 for gentle movement and strengthening  Dead bug 2x 10 with demo and max cuing for technique; better carry over on third set where theraball was used to maintain TA activation  Alt reverse lunge 2x 12 with unilateral UE support; good carry over of initial cuing for technique Straight leg deadlift 15# DB in each hand 2x 10 with cuing throughout for thoracic ext with good carry over OMEGA leg press 2x 10 35# with min cuing for true return without ER compensation  L foot on second step with overhead reach stretch  2sec hold x12; final hold 30sec stretch      PATIENT EDUCATION:  Education details: Patient was educated on diagnosis, anatomy and pathology involved, prognosis, role of PT, and was given an HEP, demonstrating exercise with proper form following verbal and tactile cues, and was given a paper hand out to continue exercise at home. Pt was educated on and agreed to plan of care.   Person educated: Patient Education method: Explanation, Demonstration, Verbal cues, and Handouts Education comprehension: verbalized understanding, returned demonstration, and verbal cues required     HOME EXERCISE PROGRAM: [email or web address]   ASSESSMENT:   CLINICAL IMPRESSION: PT initiated therex progression for increased hip and low back mobility and core strengthening with success. Pt is able to comply with all cuing for proper technique of therex with excellent effort throughout session. Pt with continued difficulty with TA activation without excessive lumbar ext  and hip flexor activation ,which he Is able to be cued from somewhat. Pt with no increased pain throughout session. PT will continue progression as able.        OBJECTIVE IMPAIRMENTS Abnormal gait, decreased activity tolerance, decreased coordination, decreased endurance, decreased mobility, difficulty walking, decreased ROM, decreased strength, hypomobility, increased fascial restrictions, increased muscle spasms, impaired flexibility, impaired tone, improper body mechanics, postural dysfunction, and pain.    ACTIVITY LIMITATIONS lifting, bending, squatting, stairs, transfers, and biking   PARTICIPATION LIMITATIONS: cleaning, driving, community activity, and yard work   PERSONAL FACTORS Past/current experiences, Time since onset of injury/illness/exacerbation, and 3+ comorbidities: PAD, OA, anxiety  are also affecting patient's functional outcome.    REHAB POTENTIAL: Good   CLINICAL DECISION MAKING: Evolving/moderate complexity   EVALUATION COMPLEXITY:  Moderate     GOALS: Goals reviewed with patient? Yes   SHORT TERM GOALS: Target date: 08/17/2022   Pt will be independent with HEP in order  to improve strength and balance in order to decrease fall risk and improve function at home and work. Baseline: 07/20/22 HEP given Goal status: INITIAL     LONG TERM GOALS: Target date: 09/14/2022   Patient will increase FOTO score to 63 to demonstrate predicted increase in functional mobility to complete ADLs Baseline: 07/20/22 49 Goal status: INITIAL   2.  Pt will demonstrate 1RM hamstring curl of RLE to at least 31.5# in order to demonstrate at least 90% of PLOF, CLLE in order to complete heavy ADLs Baseline: L: 35# R 25# Goal status: INITIAL   3.  Pt will demonstrate 1RM leg press of RLE to at least 58.5# in order to demonstrate at least 90% of PLOF, CLLE in order to complete heavy ADLs Baseline: 07/20/22 L: 65# R 45# Goal status: INITIAL   4.  Pt will decrease worst pain as reported on NPRS by at least 3 points in order to demonstrate clinically significant reduction in pain. Baseline: 07/20/22 4/10 Goal status: INITIAL       PLAN: PT FREQUENCY: 1-2x/week   PT DURATION: 8 weeks   PLANNED INTERVENTIONS: Therapeutic exercises, Therapeutic activity, Neuromuscular re-education, Balance training, Gait training, Patient/Family education, Self Care, Joint mobilization, Joint manipulation, Stair training, Aquatic Therapy, Dry Needling, Electrical stimulation, Spinal manipulation, Spinal mobilization, Cryotherapy, Moist heat, Taping, Ultrasound, Manual therapy, and Re-evaluation.   PLAN FOR NEXT SESSION: stair assessment, further squat assessment with look at ankle mobility, HEP review     Hilda Lias DPT  Hilda Lias, PT 07/29/2022, 11:47 AM

## 2022-08-03 ENCOUNTER — Encounter: Payer: Self-pay | Admitting: Physical Therapy

## 2022-08-03 ENCOUNTER — Ambulatory Visit: Payer: PPO | Admitting: Physical Therapy

## 2022-08-03 DIAGNOSIS — M25551 Pain in right hip: Secondary | ICD-10-CM

## 2022-08-03 NOTE — Therapy (Signed)
OUTPATIENT PHYSICAL THERAPY TREATMENT NOTE   Patient Name: Jim Mathis. MRN: [id number] DOB:Nov 12, 1951, 71 y.o., male Today's Date: 08/03/2022  PCP: Dimas Chyle MD REFERRING PROVIDER: Dimas Chyle MD  END OF SESSION:   PT End of Session - 08/03/22 0926     Visit Number 4    Number of Visits 17    Date for PT Re-Evaluation 09/17/22    Authorization - Visit Number 4    Authorization - Number of Visits 10    Progress Note Due on Visit 10    PT Start Time 0920    PT Stop Time 1000    PT Time Calculation (min) 40 min    Activity Tolerance Patient tolerated treatment well    Behavior During Therapy [facility] for tasks assessed/performed               Past Medical History:  Diagnosis Date   BPH with obstruction/lower urinary tract symptoms    Carotid stenosis, right    per pt was told he has some stenosis but not enough for surgery;   in epic under media ,  carotid ultrasound result right ICA 50-69% and incidental finding thyroid nodule   Cervicalgia    ED (erectile dysfunction)    History of colon resection    per pt 12 hours after partial knee arthroplasty 2017,  s/p partial colectomy was possible from divertiulitis   Hyperlipidemia    Hypertension    Migraines    PAD (peripheral artery disease) ([place]) 08-14-2019  per pt when he walks (walks 2 miles a day) right foot gets numb but recovers quickly and when he rides his bike left hand gets numb by recovers quickly ;   denies claudication or swelling   per pt s/p  stenting to bilateral lower legs in [place] one in 2003 and the other 2007,  no follow up since moved to [facility]   Wears glasses    Past Surgical History:  Procedure Laterality Date   COLOSTOMY REVERSAL  02-21-2017     [place], [place] (OR record scanned in epic)   w/ sigmoid and descending colectomy with appendectomy   CYSTOSCOPY WITH INJECTION N/A 12/19/2019   Procedure: CYSTOSCOPY WITH INJECTION;  Surgeon: Alexis Frock, MD;  Location: [facility];   Service: Urology;  Laterality: N/A;   CYSTOSCOPY WITH URETHRAL DILATATION N/A 12/19/2019   Procedure: CYSTOSCOPY WITH URETHRAL DILATATION;  Surgeon: Alexis Frock, MD;  Location: [facility];  Service: Urology;  Laterality: N/A;  76 MINS   ENDOVASCULAR STENT INSERTION  2003 and 2007  --- both done in [place]   bilateral legs   PARTIAL COLECTOMY  09-04-2016  in [place], [place]   w/  colostomy for perferated divierticulitis   PARTIAL KNEE ARTHROPLASTY Left 2017   TOTAL KNEE ARTHROPLASTY Right 2012   TRANSURETHRAL RESECTION OF PROSTATE N/A 08/15/2019   Procedure: TRANSURETHRAL RESECTION OF THE PROSTATE (TURP);  Surgeon: Alexis Frock, MD;  Location: [facility];  Service: Urology;  Laterality: N/A;   Patient Active Problem List   Diagnosis Date Noted   Paresthesias 07/19/2022   SBO (small bowel obstruction) ([place]) 05/08/2021   PAD (peripheral artery disease) ([place])    Actinic keratosis 05/06/2020   Nonintractable headache 08/22/2019   BPH with ED and OAB 05/24/2019   Essential hypertension 05/24/2019   Dyslipidemia 05/24/2019   Anxiety 05/24/2019   Seborrheic dermatitis 05/24/2019   Osteoarthritis 07/13/2012    REFERRING DIAG: R hip pain   THERAPY DIAG:  Pain in right hip  Rationale for Evaluation and Treatment Rehabilitation  PERTINENT HISTORY: PERTINENT HISTORY:  Pt is a 71 year old male presenting with R hip pain (pt reports is groin pain), denies n/t. Pt had R TKA 12 years ago, reports he doesn't think he has full mobility with this contributing to hip pain. Hip pain began in late May/early June. Had a predisone bout 2x since and this most recent bout a couple weeks ago helped pain tremendously, returned following. Pain is aggravated by active bending such as car transfers, transferring from the floor, sit to stand, and lifting leg to put on socks and shoes. Reports pain is 0/10 currently and at best; worst 4/10. Describes this as sharp, coming on and  going quickly. No popping or clicking. Pt is retired, enjoys hiking and biking. Road biking or hiking on level trails 3x/week, before hip pain was biking 3x/week and hiking 2x/week. Pt lives with wife in duplex with no stairs to enter or inside. Pt has bilat shoulder pain as well that has come on at the same time, with some carpel tunnel symptoms.   PRECAUTIONS: none  SUBJECTIVE: Pt reports that his R hip is a little sore but has no pain in this area. Most pain and area of difficulty is his shoulder at this time. Would like to d/c PT for R hip pain, and resume for L shoulder (order in Epic)  PAIN:  Are you having pain? Yes: NPRS scale: 2/10 Pain location: R hip    OBJECTIVE: (objective measures completed at initial evaluation unless otherwise dated)   DIAGNOSTIC FINDINGS:  Xray negative   PATIENT SURVEYS:  FOTO 49 goal 45   SCREENING FOR RED FLAGS: Bowel or bladder incontinence: No Spinal tumors: No Cauda equina syndrome: No Compression fracture: No Abdominal aneurysm: No   COGNITION:           Overall cognitive status: Within functional limits for tasks assessed                          SENSATION: WFL   MUSCLE LENGTH: Hamstrings: Shortened bilat between 50-75% mobility; less mobility R hamstring Elys R approx 90d L approx 100d Thomas test: Right shortened 50% deg; Left shortened 25%   POSTURE: anterior pelvic tilt and lower crossed     PALPATION: Tension to lumbar paraspinals with trigger points bilat Concordant pain to iliopsoas insertion     LUMBAR ROM:    Active  A/PROM  eval  Flexion WNL with concordant pain  Extension ~10% limited  Right lateral flexion WNL  Left lateral flexion WNL  Right rotation WNL  Left rotation WLN   (Blank rows = not tested)   LOWER EXTREMITY ROM:      Passive  Right eval Left eval  Hip flexion 114 114  Hip extension 15 25  Hip abduction wnl wnl  Hip adduction wnl wnl  Hip internal rotation 30 44  Hip external rotation  wnl wnl  Knee flexion 115 130  Knee extension 0 0  Ankle dorsiflexion 12 14  Ankle plantarflexion wnl wnl      (Blank rows = not tested)   ACTIVE Right eval Left eval  Hip flexion 104 114  Hip extension 10 10  Hip abduction wnl wnl  Hip adduction wnl wnl  Hip internal rotation 30 44  Hip external rotation WNL WNL  Knee flexion 115 128  Knee extension 5 5  Ankle dorsiflexion 10 10  Ankle plantarflexion WNL WNL   (Blank rows = not tested)   LOWER EXTREMITY MMT:     MMT Right eval Left eval  Hip flexion 4+ 5  Hip extension 4- 4-  Hip abduction 4+ 4+  Hip adduction 5 5  Hip internal rotation 5 5  Hip external rotation 4+ 4+  Knee flexion 4 4+  Knee extension 5 5  Ankle dorsiflexion 5 5  Ankle plantarflexion 5 5  Ankle inversion NT NT  Ankle eversion NT NT   (Blank rows = not tested)   **All MMTs measured within available range 5xSTS 10sec Hip flex 1RM L: 65# R 45# Knee flex 1RM L: 35# R 25#   LUMBAR SPECIAL TESTS:  Straight leg raise test: Negative, FABER test: Negative, and Thomas test: Positive   FUNCTIONAL TESTS:  5 times sit to stand: 10sec 10 meter walk test: self selected: 1.20ms; fastest 1.231m Squat: to parallel with bilat heel raise, ant pelvic tilt, reports unable to go lower feels limited by R hip     GAIT: Distance walked: [id number] device utilized: None Level of assistance: Complete Independence Comments: decreased terminal hip ext bilat, maintained lower crossed throughout gait, decreased trunk rotation        TODAY'S TREATMENT  Nustep L3 seat 9 UE 13 [id number] gentle movement and strengthening  PT reviewed the following HEP with patient with patient able to demonstrate a set of the following with min cuing for correction needed. PT educated patient on parameters of therex (how/when to inc/decrease intensity, frequency, rep/set range, stretch hold time, and purpose of therex) with verbalized understanding.  Access Code: [email or web address] URL:  [email or web address] Date: 08/03/2022 Prepared by: ChelDurwin Regesercises - Prone Hip Extension  - 1 x daily - 2 x weekly - 3 sets - 10-12 reps - Single Leg Sit to Stand with Arms Extended  - 1 x daily - 2 x weekly - 3 sets - 6-12 reps - Reverse Lunge  - 1 x daily - 2 x weekly - 3 sets - 12-20 reps - Forward T with Weight  - 1 x daily - 2 x weekly - 3 sets - 6-12 reps - Modified Thomas Stretch  - 1-2 x daily - 7 x weekly - 30-60sec hold      PATIENT EDUCATION:  Education details: HEP and d/c recs   Person educated: Patient Education method: ExplConsulting civil engineermonstration, Verbal cues, and Handouts Education comprehension: verbalized understanding, returned demonstration, and verbal cues required     HOME EXERCISE PROGRAM: [email or web address]:   CLINICAL IMPRESSION: PT reassessed goals for R hip where patient has met all goals to safely d/c to robust HEP to allow to formal PT to focus on assessment of L shoulder pain. Patient is able to verbalize and demonstrate understanding of all HEP recommendations. Pt will be evaluated for shoulder pain next visit.        OBJECTIVE IMPAIRMENTS Abnormal gait, decreased activity tolerance, decreased coordination, decreased endurance, decreased mobility, difficulty walking, decreased ROM, decreased strength, hypomobility, increased fascial restrictions, increased muscle spasms, impaired flexibility, impaired tone, improper body mechanics, postural dysfunction, and pain.    ACTIVITY LIMITATIONS lifting, bending, squatting, stairs, transfers, and biking   PARTICIPATION LIMITATIONS: cleaning, driving, community activity, and yard work   PERSONAL FACTORS Past/current experiences, Time since onset of injury/illness/exacerbation, and 3+ comorbidities: PAD, OA, anxiety  are also affecting patient's functional outcome.    REHAB POTENTIAL: Good   CLINICAL DECISION MAKING: Evolving/moderate complexity   EVALUATION  COMPLEXITY:  Moderate     GOALS: Goals reviewed with patient? Yes   SHORT TERM GOALS: Target date: 08/17/2022   Pt will be independent with HEP in order to improve strength and balance in order to decrease fall risk and improve function at home and work. Baseline: 07/20/22 HEP given Goal status: ACHIEVED     LONG TERM GOALS: Target date: 09/14/2022   Patient will increase FOTO score to 63 to demonstrate predicted increase in functional mobility to complete ADLs Baseline: 07/20/22 49; 08/03/22 69 Goal status: ACHIEVED   2.  Pt will demonstrate 1RM hamstring curl of RLE to at least 31.5# in order to demonstrate at least 90% of PLOF, CLLE in order to complete heavy ADLs Baseline: L: 35# R 25#;  08/03/22 L: 35# R 35# Goal status: ACHIEVED   3.  Pt will demonstrate 1RM leg press of RLE to at least 58.5# in order to demonstrate at least 90% of PLOF, CLLE in order to complete heavy ADLs Baseline: 07/20/22 L: 65# R 45#; 08/03/22 L: 65# R 45# Goal status: ACHIEVED   4.  Pt will decrease worst pain as reported on NPRS by at least 3 points in order to demonstrate clinically significant reduction in pain. Baseline: 07/20/22 4/10; 8/15 1/10 Goal status: ACHIEVED       PLAN: PT FREQUENCY: 1-2x/week   PT DURATION: 8 weeks   PLANNED INTERVENTIONS: Therapeutic exercises, Therapeutic activity, Neuromuscular re-education, Balance training, Gait training, Patient/Family education, Self Care, Joint mobilization, Joint manipulation, Stair training, Aquatic Therapy, Dry Needling, Electrical stimulation, Spinal manipulation, Spinal mobilization, Cryotherapy, Moist heat, Taping, Ultrasound, Manual therapy, and Re-evaluation.   PLAN FOR NEXT SESSION: stair assessment, further squat assessment with look at ankle mobility, HEP review     Durwin Reges DPT  Durwin Reges, PT 08/03/2022, 2:38 PM

## 2022-08-05 ENCOUNTER — Encounter: Payer: PPO | Admitting: Physical Therapy

## 2022-08-06 ENCOUNTER — Ambulatory Visit: Payer: PPO | Admitting: Physical Therapy

## 2022-08-06 DIAGNOSIS — M25551 Pain in right hip: Secondary | ICD-10-CM | POA: Diagnosis not present

## 2022-08-06 DIAGNOSIS — M25512 Pain in left shoulder: Secondary | ICD-10-CM

## 2022-08-06 NOTE — Therapy (Addendum)
OUTPATIENT PHYSICAL THERAPY SHOULDER EVALUATION   Patient Name: Jim Mathis. MRN: [id number] DOB:12/09/1951, 71 y.o., male Today's Date: 08/06/2022   PT End of Session - 08/06/22 0918     Visit Number 1    Number of Visits 17    Date for PT Re-Evaluation 10/15/22    Authorization - Visit Number 1    Authorization - Number of Visits 10    Progress Note Due on Visit 10    PT Start Time 0915    PT Stop Time 1000    PT Time Calculation (min) 45 min    Activity Tolerance Patient tolerated treatment well    Behavior During Therapy WFL for tasks assessed/performed             Past Medical History:  Diagnosis Date   BPH with obstruction/lower urinary tract symptoms    Carotid stenosis, right    per pt was told he has some stenosis but not enough for surgery;   in epic under media ,  carotid ultrasound result right ICA 50-69% and incidental finding thyroid nodule   Cervicalgia    ED (erectile dysfunction)    History of colon resection    per pt 12 hours after partial knee arthroplasty 2017,  s/p partial colectomy was possible from divertiulitis   Hyperlipidemia    Hypertension    Migraines    PAD (peripheral artery disease) ([facility]) 08-14-2019  per pt when he walks (walks 2 miles a day) right foot gets numb but recovers quickly and when he rides his bike left hand gets numb by recovers quickly ;   denies claudication or swelling   per pt s/p  stenting to bilateral lower legs in [place] one in 2003 and the other 2007,  no follow up since moved to [place]   Wears glasses    Past Surgical History:  Procedure Laterality Date   COLOSTOMY REVERSAL  02-21-2017     [place], [place] (OR record scanned in epic)   w/ sigmoid and descending colectomy with appendectomy   CYSTOSCOPY WITH INJECTION N/A 12/19/2019   Procedure: CYSTOSCOPY WITH INJECTION;  Surgeon: Sebastian Ache, MD;  Location: [facility];  Service: Urology;  Laterality: N/A;   CYSTOSCOPY WITH URETHRAL DILATATION N/A  12/19/2019   Procedure: CYSTOSCOPY WITH URETHRAL DILATATION;  Surgeon: Sebastian Ache, MD;  Location: [facility];  Service: Urology;  Laterality: N/A;  45 MINS   ENDOVASCULAR STENT INSERTION  2003 and 2007  --- both done in [place]   bilateral legs   PARTIAL COLECTOMY  09-04-2016  in [place], [place]   w/  colostomy for perferated divierticulitis   PARTIAL KNEE ARTHROPLASTY Left 2017   TOTAL KNEE ARTHROPLASTY Right 2012   TRANSURETHRAL RESECTION OF PROSTATE N/A 08/15/2019   Procedure: TRANSURETHRAL RESECTION OF THE PROSTATE (TURP);  Surgeon: Sebastian Ache, MD;  Location: [facility];  Service: Urology;  Laterality: N/A;   Patient Active Problem List   Diagnosis Date Noted   Paresthesias 07/19/2022   SBO (small bowel obstruction) ([facility]) 05/08/2021   PAD (peripheral artery disease) ([facility])    Actinic keratosis 05/06/2020   Nonintractable headache 08/22/2019   BPH with ED and OAB 05/24/2019   Essential hypertension 05/24/2019   Dyslipidemia 05/24/2019   Anxiety 05/24/2019   Seborrheic dermatitis 05/24/2019   Osteoarthritis 07/13/2012    PCP: Jacquiline Doe MD  REFERRING PROVIDER: Earma Reading MD  REFERRING DIAG: L shoulder pain   THERAPY DIAG:  Acute pain of left shoulder -  Plan: PT plan of care cert/re-cert  Rationale for Evaluation and Treatment Rehabilitation  ONSET DATE: May 2023  SUBJECTIVE:                                                                                                                                                                                      SUBJECTIVE STATEMENT: L shoulder pain; redicular nerve pain with carpel tunnel syndrome  PERTINENT HISTORY: Pt familiar with this clinic, recent d/c for R hip pain, being seen today for L shoulder pain. Pain with insidious onset. Has had carpel tunnel at the wrist on and off for years in L wrist. Has "nerve pain" pins and needles down the arm that feels like it starts in the shoulder  and comes down the front of the UE to the palm side of the ring and middle finger. Currently is having L carpel tunnel pain that is severely debilitating. Pain is made worse by riding his bike, driving, holding his kindle, anything that makes him hold his arm out while gripping. Has trouble sleeping, wakes up every hour with pain coming down the arm that is worst in the wrist. Current pain 4/10; worst 8/10; best 2/10. Pain is the electrical, burning sensations only. Was previously road biking 3x/week, but is unable to do so currently. Is R handed. Pt denies N/V, B&B changes, unexplained weight fluctuation, saddle paresthesia, fever, night sweats, or unrelenting night pain at this time.   PAIN:  Are you having pain? Yes: NPRS scale: 4/10 Pain location: LUE shoulder to wrist Pain description: electrical, burning Aggravating factors: riding his bike, driving, holding his kindle, anything that makes him hold his arm out while gripping. Relieving factors: getting upright after laying down  PRECAUTIONS: None  WEIGHT BEARING RESTRICTIONS No  FALLS:  Has patient fallen in last 6 months? No  LIVING ENVIRONMENT: Lives with: lives with their family and lives with their spouse Lives in: House/apartment Stairs: No Has following equipment at home: None  OCCUPATION: retired  PLOF: Independent  PATIENT GOALS Be able to drive and ride his bike without pain  OBJECTIVE:   DIAGNOSTIC FINDINGS:  Xrays L shoulder, and bilat hands: negative  PATIENT SURVEYS:  FOTO 45 goal: 54  COGNITION:  Overall cognitive status: Within functional limits for tasks assessed     SENSATION: Normal sensation at baseline; with flared symptoms cannot feel hands  POSTURE: forward head, rounded shoulders, increased thoracic kyphones  UPPER EXTREMITY ROM:   Bilat shoulder, elbow, wrist  UPPER EXTREMITY MMT:  MMT Right eval Left eval  Shoulder flexion 5 5  Shoulder extension 5 5  Shoulder abduction 5 5   Shoulder adduction  Shoulder internal rotation 5 5  Shoulder external rotation 5 5  Middle trapezius 4 4-  Lower trapezius 3+ 3+  Elbow flexion 5 5  Elbow extension 5 5  Wrist flexion 5 5  Wrist extension 5 5  Wrist ulnar deviation 5 5  Wrist radial deviation 5 5  Wrist pronation 5 5  Wrist supination 5 5  Grip strength (lbs) 78lbs 50lbs  (Blank rows = not tested)  SHOULDER SPECIAL TESTS:  Impingement tests: Neer impingement test: positive , Hawkins/Kennedy impingement test: positive , and Painful arc test: positive   SLAP lesions: Biceps load test: negative  Rotator cuff assessment: Drop arm test: negative, Empty can test: positive , Full can test: positive , External rotation lag sign: negative, Internal rotation lag sign: negative, and Infraspinatus test: positive   Biceps assessment: Speed's test: negative     Carpel Tunnel:  Phalens Test: POSITIVE  Carpel Compression Test: POSITIVE  Hoffmann-Tinel sign: POSITIVE   TOS:  Adsons: negative  Wright: negative  Roos Test: POSITIVE  Cyriax Release: negative Supraclavicular Pressure: POSITIVE Costoclavicular Maneuver: POSITIVE Median nerve: POSITIVE  Ulnar nerve: negative  Radial nerve: negative   JOINT MOBILITY TESTING:  Normal Increased nerve symptoms with 1st rib G3 glide (inf/med direction), subsides with sustained gliding  PALPATION:  Increased tension at pec major/minor, supraspinatus (concordant pain sign)   TODAY'S TREATMENT:  PT reviewed the following HEP with patient with patient able to demonstrate a set of the following with min cuing for correction needed. PT educated patient on parameters of therex (how/when to inc/decrease intensity, frequency, rep/set range, stretch hold time, and purpose of therex) with verbalized understanding.  Median nerve glide 12-20x every hour or as symptoms arise Postural correction Scalene stretch x30sec 3x/day   PATIENT EDUCATION: Education details: Patient was  educated on diagnosis, anatomy and pathology involved, prognosis, role of PT, and was given an HEP, demonstrating exercise with proper form following verbal and tactile cues, and was given a paper hand out to continue exercise at home. Pt was educated on and agreed to plan of care. Person educated: Patient Education method: Programmer, multimedia, Demonstration, Verbal cues, and Handouts Education comprehension: verbalized understanding, returned demonstration, and verbal cues required   HOME EXERCISE PROGRAM: Median nerve glide 12-20x every hour or as symptoms arise Postural correction Scalene stretch x30sec 3x/day  ASSESSMENT:  CLINICAL IMPRESSION: Patient is a 71 y.o. male who was seen today for physical therapy evaluation and treatment for L shoulder with redicular nerve pain. Examination reveals L subacromial impingement of supraspinatus, with signs and symptoms of median nerve entrapment at TOS, and at carpel tunnel; contributing to diffuse shoulder pain and nerve pain that compromises the entirety of the upper extremity with exacerbation at the wrist/hand. Impairments in L shoulder pain, sensation changes due to nerve pain, increased muscle tension, abnormal posture, and scapular dyskinesis. Activity limitations in overhead reaching, grasping, driving,  bike riding, pulling, and pushing; inhibiting participation in community and household ADLs. Would benefit from skilled PT to address above deficits and promote optimal return to PLOF.    OBJECTIVE IMPAIRMENTS Abnormal gait, decreased activity tolerance, decreased balance, decreased coordination, decreased endurance, decreased mobility, difficulty walking, decreased ROM, decreased strength, decreased safety awareness, increased fascial restrictions, impaired flexibility, impaired sensation, impaired tone, impaired UE functional use, improper body mechanics, postural dysfunction, and pain.   ACTIVITY LIMITATIONS carrying, lifting, sleeping, transfers,  bathing, dressing, reach over head, and locomotion level  PARTICIPATION LIMITATIONS: meal prep, cleaning, laundry, driving, community activity, and yard work, driving  PERSONAL FACTORS Age, Fitness, Past/current experiences, Time since onset of injury/illness/exacerbation, and 1-2 comorbidities: HLD, anxiety  are also affecting patient's functional outcome.   REHAB POTENTIAL: Good  CLINICAL DECISION MAKING: Evolving/moderate complexity  EVALUATION COMPLEXITY: Moderate   GOALS: Goals reviewed with patient? Yes  SHORT TERM GOALS: Target date: 09/03/2022  (Remove Blue Hyperlink)  Pt will be independent with HEP in order to improve strength and balance in order to decrease fall risk and improve function at home and work.  Baseline: 08/06/22 HEP given Goal status: INITIAL   LONG TERM GOALS: Target date: 10/01/2022  (Remove Blue Hyperlink)  Patient will increase FOTO score to 63  to demonstrate predicted increase in functional mobility to complete ADLs  Baseline: 08/06/22 45 Goal status: INITIAL  2.  Pt will demonstrate periscapular strength of at least 4+/5 MMT grade in order to demonstrate improvement in strength and function  Baseline:  08/06/22 R/L Mid trap T 4/4-; Lower trap Y 3+/3+  Goal status: INITIAL  3.  Pt will demonstrate L grip strength of 70.2lbs or more in order to demonstrate 90% grip strength of dominant hand in order to complete heavy ADLs Baseline: 08/06/22 50lbs Goal status: INITIAL  4.  Pt will decrease worst pain as reported on NPRS by at least 3 points in order to demonstrate clinically significant reduction in pain.  Baseline: 08/06/22 8/10 Goal status: INITIAL    PLAN: PT FREQUENCY: 1-2x/week  PT DURATION: 8 weeks  PLANNED INTERVENTIONS: Therapeutic exercises, Therapeutic activity, Neuromuscular re-education, Balance training, Gait training, Patient/Family education, Self Care, Joint mobilization, Joint manipulation, DME instructions, Dry Needling,  Electrical stimulation, Spinal manipulation, Spinal mobilization, Cryotherapy, Moist heat, Taping, Traction, Ultrasound, Ionotophoresis 4mg /ml Dexamethasone, Manual therapy, and Re-evaluation  PLAN FOR NEXT SESSION: HEP review; TDN   DPT Hilda Lias, PT 08/06/2022, 11:08 AM

## 2022-08-07 NOTE — Telephone Encounter (Signed)
See note

## 2022-08-09 ENCOUNTER — Encounter: Payer: Self-pay | Admitting: Physical Therapy

## 2022-08-09 ENCOUNTER — Ambulatory Visit: Payer: PPO | Admitting: Physical Therapy

## 2022-08-09 ENCOUNTER — Ambulatory Visit: Payer: PPO | Admitting: Family Medicine

## 2022-08-09 ENCOUNTER — Ambulatory Visit: Payer: Self-pay

## 2022-08-09 VITALS — BP 140/78 | HR 73 | Ht 70.0 in | Wt 187.8 lb

## 2022-08-09 DIAGNOSIS — M25512 Pain in left shoulder: Secondary | ICD-10-CM

## 2022-08-09 DIAGNOSIS — M25551 Pain in right hip: Secondary | ICD-10-CM | POA: Diagnosis not present

## 2022-08-09 DIAGNOSIS — G5602 Carpal tunnel syndrome, left upper limb: Secondary | ICD-10-CM | POA: Diagnosis not present

## 2022-08-09 NOTE — Progress Notes (Signed)
   I, [name], LAT, ATC acting as a scribe for [name], MD.  Jim Mathis. is a 71 y.o. male who presents to [facility] at [facility] today for cont'd L wrist pain due to CTS. Pt was last seen by Dr. Denyse Amass on 07/27/22 for L shoulder and L wrist pain and treating his L shoulder was added to his PT order. Today, pt reports he has had a few instances where he exacerbated his L wrist pain; bike ride and installing some shelves. He notes numbness and tingling distally into his fingers.  Grip strength: normal  Numbness/tingling: yes  Dx testing: 06/25/22 R & L hand XR, bilat hips and L shoulder XR             07/03/19 NCV study  Pertinent review of systems: No fevers or chills  Relevant historical information: PAD.   Exam:  BP (!) 140/78   Pulse 73   Ht 5\' 10"  (1.778 m)   Wt 187 lb 12.8 oz (85.2 kg)   SpO2 97%   BMI 26.95 kg/m  General: Well Developed, well nourished, and in no acute distress.   MSK: Left wrist normal appearing Positive Tinel's carpal tunnel.  Intact grip strength.    Lab and Radiology Results  Procedure: Real-time Ultrasound Guided hydrodissection median nerve at left carpal tunnel Device: Philips Affiniti 50G Images permanently stored and available for review in PACS Verbal informed consent obtained.  Discussed risks and benefits of procedure. Warned about infection, bleeding, hyperglycemia damage to structures among others. Patient expresses understanding and agreement Time-out conducted.   Noted no overlying erythema, induration, or other signs of local infection.   Skin prepped in a sterile fashion.   Local anesthesia: Topical Ethyl chloride.   With sterile technique and under real time ultrasound guidance: 40 mg of Kenalog and 1 mL of lidocaine injected into carpal tunnel around median nerve. Fluid seen entering the carpal tunnel.   Completed without difficulty   Pain immediately resolved suggesting accurate placement of the  medication.   Advised to call if fevers/chills, erythema, induration, drainage, or persistent bleeding.   Images permanently stored and available for review in the ultrasound unit.  Impression: Technically successful ultrasound guided injection.        Assessment and Plan: 72 y.o. male with left carpal tunnel syndrome.  Failing typical conservative management strategy with wrist brace at night.  We will try injection/median nerve hydrodissection today.  If not improving he will let me know and I can proceed with nerve conduction study.   PDMP not reviewed this encounter. Orders Placed This Encounter  Procedures   62 LIMITED JOINT SPACE STRUCTURES UP LEFT(NO LINKED CHARGES)    Order Specific Question:   Reason for Exam (SYMPTOM  OR DIAGNOSIS REQUIRED)    Answer:   left wrist pain    Order Specific Question:   Preferred imaging location?    Answer:   [facility]   No orders of the defined types were placed in this encounter.    Discussed warning signs or symptoms. Please see discharge instructions. Patient expresses understanding.   The above documentation has been reviewed and is accurate and complete US, M.D.

## 2022-08-09 NOTE — Patient Instructions (Addendum)
Thank you for coming in today.   You received an injection today. Seek immediate medical attention if the joint becomes red, extremely painful, or is oozing fluid.   Check back as needed  If the shot is not working well in a few weeks let me know. Next step is a nerve conduction study. I can just order it.

## 2022-08-09 NOTE — Therapy (Signed)
OUTPATIENT PHYSICAL THERAPY SHOULDER EVALUATION   Patient Name: Jim Mathis. MRN: [id number] DOB:[birth year], 71 y.o., male Today's Date: 08/09/2022   PT End of Session - 08/09/22 0939     Visit Number 2    Number of Visits 17    Date for PT Re-Evaluation 10/15/22    Authorization - Visit Number 2    Authorization - Number of Visits 10    Progress Note Due on Visit 10    PT Start Time 0917    PT Stop Time 0955    PT Time Calculation (min) 38 min    Activity Tolerance Patient tolerated treatment well    Behavior During Therapy [facility] for tasks assessed/performed              Past Medical History:  Diagnosis Date   BPH with obstruction/lower urinary tract symptoms    Carotid stenosis, right    per pt was told he has some stenosis but not enough for surgery;   in epic under media ,  carotid ultrasound result right ICA 50-69% and incidental finding thyroid nodule   Cervicalgia    ED (erectile dysfunction)    History of colon resection    per pt 12 hours after partial knee arthroplasty 2017,  s/p partial colectomy was possible from divertiulitis   Hyperlipidemia    Hypertension    Migraines    PAD (peripheral artery disease) ([place]) 08-14-2019  per pt when he walks (walks 2 miles a day) right foot gets numb but recovers quickly and when he rides his bike left hand gets numb by recovers quickly ;   denies claudication or swelling   per pt s/p  stenting to bilateral lower legs in [place] one in 2003 and the other 2007,  no follow up since moved to [place]   Wears glasses    Past Surgical History:  Procedure Laterality Date   COLOSTOMY REVERSAL  02-21-2017     [place], [place] (OR record scanned in epic)   w/ sigmoid and descending colectomy with appendectomy   CYSTOSCOPY WITH INJECTION N/A 12/19/2019   Procedure: CYSTOSCOPY WITH INJECTION;  Surgeon: Alexis Frock, MD;  Location: [facility];  Service: Urology;  Laterality: N/A;   CYSTOSCOPY WITH URETHRAL DILATATION  N/A 12/19/2019   Procedure: CYSTOSCOPY WITH URETHRAL DILATATION;  Surgeon: Alexis Frock, MD;  Location: [facility];  Service: Urology;  Laterality: N/A;  74 MINS   ENDOVASCULAR STENT INSERTION  2003 and 2007  --- both done in [place]   bilateral legs   PARTIAL COLECTOMY  09-04-2016  in [place], [place]   w/  colostomy for perferated divierticulitis   PARTIAL KNEE ARTHROPLASTY Left 2017   TOTAL KNEE ARTHROPLASTY Right 2012   TRANSURETHRAL RESECTION OF PROSTATE N/A 08/15/2019   Procedure: TRANSURETHRAL RESECTION OF THE PROSTATE (TURP);  Surgeon: Alexis Frock, MD;  Location: [facility];  Service: Urology;  Laterality: N/A;   Patient Active Problem List   Diagnosis Date Noted   Paresthesias 07/19/2022   SBO (small bowel obstruction) ([place]) 05/08/2021   PAD (peripheral artery disease) ([place])    Actinic keratosis 05/06/2020   Nonintractable headache 08/22/2019   BPH with ED and OAB 05/24/2019   Essential hypertension 05/24/2019   Dyslipidemia 05/24/2019   Anxiety 05/24/2019   Seborrheic dermatitis 05/24/2019   Osteoarthritis 07/13/2012    PCP: Dimas Chyle MD  REFERRING PROVIDER: Sherene Sires MD  REFERRING DIAG: L shoulder pain   THERAPY DIAG:  Acute pain of left  shoulder  Rationale for Evaluation and Treatment Rehabilitation  ONSET DATE: May 2023  SUBJECTIVE:                                                                                                                                                                                      SUBJECTIVE STATEMENT: Pt reports he had a bad night Saturday with carpel tunnel, is having a shot today. Reports less so pain down the entire arm, but is still having this. Currently very minimal symptoms. Compliance with HEP, but reports the nerve glide did not help.   PERTINENT HISTORY: Pt familiar with this clinic, recent d/c for R hip pain, being seen today for L shoulder pain. Pain with insidious onset. Has had  carpel tunnel at the wrist on and off for years in L wrist. Has "nerve pain" pins and needles down the arm that feels like it starts in the shoulder and comes down the front of the UE to the palm side of the ring and middle finger. Currently is having L carpel tunnel pain that is severely debilitating. Pain is made worse by riding his bike, driving, holding his kindle, anything that makes him hold his arm out while gripping. Has trouble sleeping, wakes up every hour with pain coming down the arm that is worst in the wrist. Current pain 4/10; worst 8/10; best 2/10. Pain is the electrical, burning sensations only. Was previously road biking 3x/week, but is unable to do so currently. Is R handed. Pt denies N/V, B&B changes, unexplained weight fluctuation, saddle paresthesia, fever, night sweats, or unrelenting night pain at this time.   PAIN:  Are you having pain? Yes: NPRS scale: 4/10 Pain location: LUE shoulder to wrist Pain description: electrical, burning Aggravating factors: riding his bike, driving, holding his kindle, anything that makes him hold his arm out while gripping. Relieving factors: getting upright after laying down  PRECAUTIONS: None  WEIGHT BEARING RESTRICTIONS No  FALLS:  Has patient fallen in last 6 months? No  LIVING ENVIRONMENT: Lives with: lives with their family and lives with their spouse Lives in: House/apartment Stairs: No Has following equipment at home: None  OCCUPATION: retired  PLOF: Independent  PATIENT GOALS Be able to drive and ride his bike without pain  OBJECTIVE:   DIAGNOSTIC FINDINGS:  Xrays L shoulder, and bilat hands: negative  PATIENT SURVEYS:  FOTO 45 goal: 54  COGNITION:  Overall cognitive status: Within functional limits for tasks assessed     SENSATION: Normal sensation at baseline; with flared symptoms cannot feel hands  POSTURE: forward head, rounded shoulders, increased thoracic kyphones  UPPER EXTREMITY ROM:   Bilat  shoulder, elbow, wrist  UPPER EXTREMITY MMT:  MMT Right eval Left eval  Shoulder flexion 5 5  Shoulder extension 5 5  Shoulder abduction 5 5  Shoulder adduction    Shoulder internal rotation 5 5  Shoulder external rotation 5 5  Middle trapezius 4 4-  Lower trapezius 3+ 3+  Elbow flexion 5 5  Elbow extension 5 5  Wrist flexion 5 5  Wrist extension 5 5  Wrist ulnar deviation 5 5  Wrist radial deviation 5 5  Wrist pronation 5 5  Wrist supination 5 5  Grip strength (lbs) 78lbs 50lbs  (Blank rows = not tested)  SHOULDER SPECIAL TESTS:  Impingement tests: Neer impingement test: positive , Hawkins/Kennedy impingement test: positive , and Painful arc test: positive   SLAP lesions: Biceps load test: negative  Rotator cuff assessment: Drop arm test: negative, Empty can test: positive , Full can test: positive , External rotation lag sign: negative, Internal rotation lag sign: negative, and Infraspinatus test: positive   Biceps assessment: Speed's test: negative     Carpel Tunnel:  Phalens Test: POSITIVE  Carpel Compression Test: POSITIVE  Hoffmann-Tinel sign: POSITIVE   TOS:  Adsons: negative  Wright: negative  Roos Test: POSITIVE  Cyriax Release: negative Supraclavicular Pressure: POSITIVE Costoclavicular Maneuver: POSITIVE Median nerve: POSITIVE  Ulnar nerve: negative  Radial nerve: negative   JOINT MOBILITY TESTING:  Normal Increased nerve symptoms with 1st rib G3 glide (inf/med direction), subsides with sustained gliding  PALPATION:  Increased tension at pec major/minor, supraspinatus (concordant pain sign)   TODAY'S TREATMENT:  Manual STM with trigger point release to L UT, scalene group and pec major/minor Following: Dry Needling: (1/1) 56mm .25 needles placed along the L UT and pec minor with pincer grasp to decrease increased muscular spasms and trigger points with the patient positioned in supine. Patient was educated on risks and benefits of therapy and  verbally consents to PT.    Ther-Ex Thoracic ext over towel roll x12 with hands behind head for pec stretch, good carry over of cuing for breath control T in prone 2x 10 with cuing for scapular retraction with good carry over of Prone hands behind head (modified to avoid excessive wrist ext) ext lift 2x 10 with good carry over of cuing for technique Scapular retraction with post shoulder roll x12; carry over into RTB bilat ER x12 Post shoulder rolls x12    Invention at [employer] PT reviewed the following HEP with patient with patient able to demonstrate a set of the following with min cuing for correction needed. PT educated patient on parameters of therex (how/when to inc/decrease intensity, frequency, rep/set range, stretch hold time, and purpose of therex) with verbalized understanding.  Median nerve glide 12-20x every hour or as symptoms arise Postural correction Scalene stretch x30sec 3x/day   PATIENT EDUCATION: Education details: Patient was educated on diagnosis, anatomy and pathology involved, prognosis, role of PT, and was given an HEP, demonstrating exercise with proper form following verbal and tactile cues, and was given a paper hand out to continue exercise at home. Pt was educated on and agreed to plan of care. Person educated: Patient Education method: Explanation, Demonstration, Verbal cues, and Handouts Education comprehension: verbalized understanding, returned demonstration, and verbal cues required   HOME EXERCISE PROGRAM: Median nerve glide 12-20x every hour or as symptoms arise Postural correction Scalene stretch x30sec 3x/day  ASSESSMENT:  CLINICAL IMPRESSION: PT initiated manual and therex techniques to restore neutral length/tension relationship of the scapuloumeral complex with success. Pt with markable decreased  shoulder height in supine following TDN and manual techniques, allowing for reciprocal inhibition with periscapular strengthening with success. Pt is  able to comply with all cuing for proper technique with good motivation throughout session. PT will continue progression as able.     OBJECTIVE IMPAIRMENTS Abnormal gait, decreased activity tolerance, decreased balance, decreased coordination, decreased endurance, decreased mobility, difficulty walking, decreased ROM, decreased strength, decreased safety awareness, increased fascial restrictions, impaired flexibility, impaired sensation, impaired tone, impaired UE functional use, improper body mechanics, postural dysfunction, and pain.   ACTIVITY LIMITATIONS carrying, lifting, sleeping, transfers, bathing, dressing, reach over head, and locomotion level  PARTICIPATION LIMITATIONS: meal prep, cleaning, laundry, driving, community activity, and yard work, driving  [place] Age, Fitness, Past/current experiences, Time since onset of injury/illness/exacerbation, and 1-2 comorbidities: HLD, anxiety  are also affecting patient's functional outcome.   REHAB POTENTIAL: Good  CLINICAL DECISION MAKING: Evolving/moderate complexity  EVALUATION COMPLEXITY: Moderate   GOALS: Goals reviewed with patient? Yes  SHORT TERM GOALS: Target date: 09/06/2022  (Remove Blue Hyperlink)  Pt will be independent with HEP in order to improve strength and balance in order to decrease fall risk and improve function at home and work.  Baseline: 08/06/22 HEP given Goal status: INITIAL   LONG TERM GOALS: Target date: 10/04/2022  (Remove Blue Hyperlink)  Patient will increase FOTO score to 63  to demonstrate predicted increase in functional mobility to complete ADLs  Baseline: 08/06/22 45 Goal status: INITIAL  2.  Pt will demonstrate periscapular strength of at least 4+/5 MMT grade in order to demonstrate improvement in strength and function  Baseline:  08/06/22 R/L Mid trap T 4/4-; Lower trap Y 3+/3+  Goal status: INITIAL  3.  Pt will demonstrate L grip strength of 70.2lbs or more in order to  demonstrate 90% grip strength of dominant hand in order to complete heavy ADLs Baseline: 08/06/22 50lbs Goal status: INITIAL  4.  Pt will decrease worst pain as reported on NPRS by at least 3 points in order to demonstrate clinically significant reduction in pain.  Baseline: 08/06/22 8/10 Goal status: INITIAL    PLAN: PT FREQUENCY: 1-2x/week  PT DURATION: 8 weeks  PLANNED INTERVENTIONS: Therapeutic exercises, Therapeutic activity, Neuromuscular re-education, Balance training, Gait training, Patient/Family education, Self Care, Joint mobilization, Joint manipulation, DME instructions, Dry Needling, Electrical stimulation, Spinal manipulation, Spinal mobilization, Cryotherapy, Moist heat, Taping, Traction, Ultrasound, Ionotophoresis 4mg /ml Dexamethasone, Manual therapy, and Re-evaluation  PLAN FOR NEXT SESSION: HEP review; TDN   Durwin Reges DPT Durwin Reges, PT 08/09/2022, 10:27 AM

## 2022-08-09 NOTE — Telephone Encounter (Signed)
Yes it is ok for him to take it for now but I will defer further management to sports medicine.  Katina Degree. Jimmey Ralph, MD 08/09/2022 8:43 AM

## 2022-08-11 ENCOUNTER — Encounter: Payer: Self-pay | Admitting: Physical Therapy

## 2022-08-11 ENCOUNTER — Ambulatory Visit: Payer: PPO | Admitting: Physical Therapy

## 2022-08-11 DIAGNOSIS — M25551 Pain in right hip: Secondary | ICD-10-CM | POA: Diagnosis not present

## 2022-08-11 DIAGNOSIS — M25512 Pain in left shoulder: Secondary | ICD-10-CM

## 2022-08-11 NOTE — Therapy (Addendum)
OUTPATIENT PHYSICAL THERAPY SHOULDER TREATMENT   Patient Name: Jim Mathis. MRN: [id number] DOB:[birth year], 71 y.o., male Today's Date: 08/11/2022   PT End of Session - 08/11/22 0940     Visit Number 3    Number of Visits 17    Date for PT Re-Evaluation 10/15/22    Authorization - Visit Number 3    Authorization - Number of Visits 10    Progress Note Due on Visit 10    PT Start Time 0917    PT Stop Time 0957    PT Time Calculation (min) 40 min    Activity Tolerance Patient tolerated treatment well    Behavior During Therapy [facility] for tasks assessed/performed               Past Medical History:  Diagnosis Date   BPH with obstruction/lower urinary tract symptoms    Carotid stenosis, right    per pt was told he has some stenosis but not enough for surgery;   in epic under media ,  carotid ultrasound result right ICA 50-69% and incidental finding thyroid nodule   Cervicalgia    ED (erectile dysfunction)    History of colon resection    per pt 12 hours after partial knee arthroplasty 2017,  s/p partial colectomy was possible from divertiulitis   Hyperlipidemia    Hypertension    Migraines    PAD (peripheral artery disease) (HCC) 08-14-2019  per pt when he walks (walks 2 miles a day) right foot gets numb but recovers quickly and when he rides his bike left hand gets numb by recovers quickly ;   denies claudication or swelling   per pt s/p  stenting to bilateral lower legs in [place] one in 2003 and the other 2007,  no follow up since moved to [place]   Wears glasses    Past Surgical History:  Procedure Laterality Date   COLOSTOMY REVERSAL  02-21-2017     [place], [place] (OR record scanned in epic)   w/ sigmoid and descending colectomy with appendectomy   CYSTOSCOPY WITH INJECTION N/A 12/19/2019   Procedure: CYSTOSCOPY WITH INJECTION;  Surgeon: Sebastian Ache, MD;  Location: [facility];  Service: Urology;  Laterality: N/A;   CYSTOSCOPY WITH URETHRAL DILATATION  N/A 12/19/2019   Procedure: CYSTOSCOPY WITH URETHRAL DILATATION;  Surgeon: Sebastian Ache, MD;  Location: [facility];  Service: Urology;  Laterality: N/A;  45 MINS   ENDOVASCULAR STENT INSERTION  2003 and 2007  --- both done in [place]   bilateral legs   PARTIAL COLECTOMY  09-04-2016  in [place], [place]   w/  colostomy for perferated divierticulitis   PARTIAL KNEE ARTHROPLASTY Left 2017   TOTAL KNEE ARTHROPLASTY Right 2012   TRANSURETHRAL RESECTION OF PROSTATE N/A 08/15/2019   Procedure: TRANSURETHRAL RESECTION OF THE PROSTATE (TURP);  Surgeon: Sebastian Ache, MD;  Location: [facility];  Service: Urology;  Laterality: N/A;   Patient Active Problem List   Diagnosis Date Noted   Paresthesias 07/19/2022   SBO (small bowel obstruction) (HCC) 05/08/2021   PAD (peripheral artery disease) (HCC)    Actinic keratosis 05/06/2020   Nonintractable headache 08/22/2019   BPH with ED and OAB 05/24/2019   Essential hypertension 05/24/2019   Dyslipidemia 05/24/2019   Anxiety 05/24/2019   Seborrheic dermatitis 05/24/2019   Osteoarthritis 07/13/2012    PCP: Jacquiline Doe MD  REFERRING PROVIDER: Earma Reading MD  REFERRING DIAG: L shoulder pain   THERAPY DIAG:  Acute pain of  left shoulder  Rationale for Evaluation and Treatment Rehabilitation  ONSET DATE: May 2023  SUBJECTIVE:                                                                                                                                                                                      SUBJECTIVE STATEMENT: Pt reports has done better with the carpel tunnel symptoms since shot- slept well last night following. Reports he biked yesterday and that went well; had some tingling down the arm but did not have have the extreme numbness following he had the week following. He reports he is working on his posutre, only 2/10 pain today.   PERTINENT HISTORY: Pt familiar with this clinic, recent d/c for R hip  pain, being seen today for L shoulder pain. Pain with insidious onset. Has had carpel tunnel at the wrist on and off for years in L wrist. Has "nerve pain" pins and needles down the arm that feels like it starts in the shoulder and comes down the front of the UE to the palm side of the ring and middle finger. Currently is having L carpel tunnel pain that is severely debilitating. Pain is made worse by riding his bike, driving, holding his kindle, anything that makes him hold his arm out while gripping. Has trouble sleeping, wakes up every hour with pain coming down the arm that is worst in the wrist. Current pain 4/10; worst 8/10; best 2/10. Pain is the electrical, burning sensations only. Was previously road biking 3x/week, but is unable to do so currently. Is R handed. Pt denies N/V, B&B changes, unexplained weight fluctuation, saddle paresthesia, fever, night sweats, or unrelenting night pain at this time.   PAIN:  Are you having pain? Yes: NPRS scale: 4/10 Pain location: LUE shoulder to wrist Pain description: electrical, burning Aggravating factors: riding his bike, driving, holding his kindle, anything that makes him hold his arm out while gripping. Relieving factors: getting upright after laying down  PRECAUTIONS: None  WEIGHT BEARING RESTRICTIONS No  FALLS:  Has patient fallen in last 6 months? No  LIVING ENVIRONMENT: Lives with: lives with their family and lives with their spouse Lives in: House/apartment Stairs: No Has following equipment at home: None  OCCUPATION: retired  PLOF: Independent  PATIENT GOALS Be able to drive and ride his bike without pain  OBJECTIVE:   DIAGNOSTIC FINDINGS:  Xrays L shoulder, and bilat hands: negative  PATIENT SURVEYS:  FOTO 45 goal: 27  COGNITION:  Overall cognitive status: Within functional limits for tasks assessed     SENSATION: Normal sensation at baseline; with flared symptoms cannot feel hands  POSTURE: forward head, rounded  shoulders, increased thoracic kyphones  UPPER EXTREMITY ROM:   Bilat shoulder, elbow, wrist  UPPER EXTREMITY MMT:  MMT Right eval Left eval  Shoulder flexion 5 5  Shoulder extension 5 5  Shoulder abduction 5 5  Shoulder adduction    Shoulder internal rotation 5 5  Shoulder external rotation 5 5  Middle trapezius 4 4-  Lower trapezius 3+ 3+  Elbow flexion 5 5  Elbow extension 5 5  Wrist flexion 5 5  Wrist extension 5 5  Wrist ulnar deviation 5 5  Wrist radial deviation 5 5  Wrist pronation 5 5  Wrist supination 5 5  Grip strength (lbs) 78lbs 50lbs  (Blank rows = not tested)  SHOULDER SPECIAL TESTS:  Impingement tests: Neer impingement test: positive , Hawkins/Kennedy impingement test: positive , and Painful arc test: positive   SLAP lesions: Biceps load test: negative  Rotator cuff assessment: Drop arm test: negative, Empty can test: positive , Full can test: positive , External rotation lag sign: negative, Internal rotation lag sign: negative, and Infraspinatus test: positive   Biceps assessment: Speed's test: negative     Carpel Tunnel:  Phalens Test: POSITIVE  Carpel Compression Test: POSITIVE  Hoffmann-Tinel sign: POSITIVE   TOS:  Adsons: negative  Wright: negative  Roos Test: POSITIVE  Cyriax Release: negative Supraclavicular Pressure: POSITIVE Costoclavicular Maneuver: POSITIVE Median nerve: POSITIVE  Ulnar nerve: negative  Radial nerve: negative   JOINT MOBILITY TESTING:  Normal Increased nerve symptoms with 1st rib G3 glide (inf/med direction), subsides with sustained gliding  PALPATION:  Increased tension at pec major/minor, supraspinatus (concordant pain sign)   TODAY'S TREATMENT:  Manual STM with trigger point release to L UT, scalene group and pec major/minor Grade 3 GHJ mob with ER 2x 30sec bouts  Ther-Ex Snow angel on wall x12 with difficulty maintaining contact Thoracic ext over towel roll x12 with hands behind head for pec stretch,  good carry over of cuing for breath control Full superman 2x 8 with good carry over of initial cuing for scapular retraction Good mornings with dowel across shoulders x12; with 10# DB held at shoulders 2x 6  Cat/camel x12 with good cuing needed for initial technique with good carry over  Post shoulder rolls x12    Invention at [employer] PT reviewed the following HEP with patient with patient able to demonstrate a set of the following with min cuing for correction needed. PT educated patient on parameters of therex (how/when to inc/decrease intensity, frequency, rep/set range, stretch hold time, and purpose of therex) with verbalized understanding.  Median nerve glide 12-20x every hour or as symptoms arise Postural correction Scalene stretch x30sec 3x/day   PATIENT EDUCATION: Education details: Patient was educated on diagnosis, anatomy and pathology involved, prognosis, role of PT, and was given an HEP, demonstrating exercise with proper form following verbal and tactile cues, and was given a paper hand out to continue exercise at home. Pt was educated on and agreed to plan of care. Person educated: Patient Education method: Explanation, Demonstration, Verbal cues, and Handouts Education comprehension: verbalized understanding, returned demonstration, and verbal cues required   HOME EXERCISE PROGRAM: Median nerve glide 12-20x every hour or as symptoms arise Postural correction Scalene stretch x30sec 3x/day  ASSESSMENT:  CLINICAL IMPRESSION: PT initiated manual and therex techniques to restore neutral length/tension relationship of the scapuloumeral complex with success. Pt is able to comply with all cuing for proper technique of therex with good effort throughout session, without increase in symptoms. PT will continue progression  as able.     OBJECTIVE IMPAIRMENTS Abnormal gait, decreased activity tolerance, decreased balance, decreased coordination, decreased endurance, decreased  mobility, difficulty walking, decreased ROM, decreased strength, decreased safety awareness, increased fascial restrictions, impaired flexibility, impaired sensation, impaired tone, impaired UE functional use, improper body mechanics, postural dysfunction, and pain.   ACTIVITY LIMITATIONS carrying, lifting, sleeping, transfers, bathing, dressing, reach over head, and locomotion level  PARTICIPATION LIMITATIONS: meal prep, cleaning, laundry, driving, community activity, and yard work, driving  PERSONAL FACTORS Age, Fitness, Past/current experiences, Time since onset of injury/illness/exacerbation, and 1-2 comorbidities: HLD, anxiety  are also affecting patient's functional outcome.   REHAB POTENTIAL: Good  CLINICAL DECISION MAKING: Evolving/moderate complexity  EVALUATION COMPLEXITY: Moderate   GOALS: Goals reviewed with patient? Yes  SHORT TERM GOALS: Target date: 09/08/2022  (Remove Blue Hyperlink)  Pt will be independent with HEP in order to improve strength and balance in order to decrease fall risk and improve function at home and work.  Baseline: 08/06/22 HEP given Goal status: INITIAL   LONG TERM GOALS: Target date: 10/06/2022  (Remove Blue Hyperlink)  Patient will increase FOTO score to 63  to demonstrate predicted increase in functional mobility to complete ADLs  Baseline: 08/06/22 45 Goal status: INITIAL  2.  Pt will demonstrate periscapular strength of at least 4+/5 MMT grade in order to demonstrate improvement in strength and function  Baseline:  08/06/22 R/L Mid trap T 4/4-; Lower trap Y 3+/3+  Goal status: INITIAL  3.  Pt will demonstrate L grip strength of 70.2lbs or more in order to demonstrate 90% grip strength of dominant hand in order to complete heavy ADLs Baseline: 08/06/22 50lbs Goal status: INITIAL  4.  Pt will decrease worst pain as reported on NPRS by at least 3 points in order to demonstrate clinically significant reduction in pain.  Baseline: 08/06/22  8/10 Goal status: INITIAL    PLAN: PT FREQUENCY: 1-2x/week  PT DURATION: 8 weeks  PLANNED INTERVENTIONS: Therapeutic exercises, Therapeutic activity, Neuromuscular re-education, Balance training, Gait training, Patient/Family education, Self Care, Joint mobilization, Joint manipulation, DME instructions, Dry Needling, Electrical stimulation, Spinal manipulation, Spinal mobilization, Cryotherapy, Moist heat, Taping, Traction, Ultrasound, Ionotophoresis 4mg /ml Dexamethasone, Manual therapy, and Re-evaluation  PLAN FOR NEXT SESSION: HEP review; TDN   DPT Hilda Lias, PT 08/11/2022, 10:00 AM

## 2022-08-13 ENCOUNTER — Other Ambulatory Visit: Payer: Self-pay | Admitting: Family Medicine

## 2022-08-16 ENCOUNTER — Encounter: Payer: Self-pay | Admitting: Physical Therapy

## 2022-08-16 ENCOUNTER — Ambulatory Visit: Payer: PPO | Admitting: Physical Therapy

## 2022-08-16 DIAGNOSIS — M25512 Pain in left shoulder: Secondary | ICD-10-CM

## 2022-08-16 DIAGNOSIS — M25551 Pain in right hip: Secondary | ICD-10-CM | POA: Diagnosis not present

## 2022-08-16 NOTE — Therapy (Signed)
OUTPATIENT PHYSICAL THERAPY SHOULDER TREATMENT   Patient Name: Jim Mathis. MRN: [id number] DOB:1951-08-17, 71 y.o., male Today's Date: 08/16/2022   PT End of Session - 08/16/22 0946     Visit Number 4    Number of Visits 17    Date for PT Re-Evaluation 10/15/22    Authorization - Visit Number 4    Authorization - Number of Visits 10    Progress Note Due on Visit 10    PT Start Time 0915    PT Stop Time 0953    PT Time Calculation (min) 38 min    Activity Tolerance Patient tolerated treatment well    Behavior During Therapy [facility] for tasks assessed/performed                Past Medical History:  Diagnosis Date   BPH with obstruction/lower urinary tract symptoms    Carotid stenosis, right    per pt was told he has some stenosis but not enough for surgery;   in epic under media ,  carotid ultrasound result right ICA 50-69% and incidental finding thyroid nodule   Cervicalgia    ED (erectile dysfunction)    History of colon resection    per pt 12 hours after partial knee arthroplasty 2017,  s/p partial colectomy was possible from divertiulitis   Hyperlipidemia    Hypertension    Migraines    PAD (peripheral artery disease) (HCC) 08-14-2019  per pt when he walks (walks 2 miles a day) right foot gets numb but recovers quickly and when he rides his bike left hand gets numb by recovers quickly ;   denies claudication or swelling   per pt s/p  stenting to bilateral lower legs in [place] one in 2003 and the other 2007,  no follow up since moved to [place]   Wears glasses    Past Surgical History:  Procedure Laterality Date   COLOSTOMY REVERSAL  02-21-2017     [place], [place] (OR record scanned in epic)   w/ sigmoid and descending colectomy with appendectomy   CYSTOSCOPY WITH INJECTION N/A 12/19/2019   Procedure: CYSTOSCOPY WITH INJECTION;  Surgeon: Sebastian Ache, MD;  Location: [facility];  Service: Urology;  Laterality: N/A;   CYSTOSCOPY WITH URETHRAL DILATATION  N/A 12/19/2019   Procedure: CYSTOSCOPY WITH URETHRAL DILATATION;  Surgeon: Sebastian Ache, MD;  Location: [facility];  Service: Urology;  Laterality: N/A;  45 MINS   ENDOVASCULAR STENT INSERTION  2003 and 2007  --- both done in [place]   bilateral legs   PARTIAL COLECTOMY  09-04-2016  in [place], [place]   w/  colostomy for perferated divierticulitis   PARTIAL KNEE ARTHROPLASTY Left 2017   TOTAL KNEE ARTHROPLASTY Right 2012   TRANSURETHRAL RESECTION OF PROSTATE N/A 08/15/2019   Procedure: TRANSURETHRAL RESECTION OF THE PROSTATE (TURP);  Surgeon: Sebastian Ache, MD;  Location: [facility];  Service: Urology;  Laterality: N/A;   Patient Active Problem List   Diagnosis Date Noted   Paresthesias 07/19/2022   SBO (small bowel obstruction) (HCC) 05/08/2021   PAD (peripheral artery disease) (HCC)    Actinic keratosis 05/06/2020   Nonintractable headache 08/22/2019   BPH with ED and OAB 05/24/2019   Essential hypertension 05/24/2019   Dyslipidemia 05/24/2019   Anxiety 05/24/2019   Seborrheic dermatitis 05/24/2019   Osteoarthritis 07/13/2012    PCP: Jacquiline Doe MD  REFERRING PROVIDER: Earma Reading MD  REFERRING DIAG: L shoulder pain   THERAPY DIAG:  Acute pain  of left shoulder  Pain in right hip  Rationale for Evaluation and Treatment Rehabilitation  ONSET DATE: May 2023  SUBJECTIVE:                                                                                                                                                                                      SUBJECTIVE STATEMENT: Pt reports carpel tunnel in the L is better, his shoulder has been bothering him more since last week. Felt good for a day or so following therapy. L shoulder is keeping him up at night. When n/t comes on his nerve glide helps this- but hasn't done it much. Reports 2/10 this am in fingers.   PERTINENT HISTORY: Pt familiar with this clinic, recent d/c for R hip pain, being  seen today for L shoulder pain. Pain with insidious onset. Has had carpel tunnel at the wrist on and off for years in L wrist. Has "nerve pain" pins and needles down the arm that feels like it starts in the shoulder and comes down the front of the UE to the palm side of the ring and middle finger. Currently is having L carpel tunnel pain that is severely debilitating. Pain is made worse by riding his bike, driving, holding his kindle, anything that makes him hold his arm out while gripping. Has trouble sleeping, wakes up every hour with pain coming down the arm that is worst in the wrist. Current pain 4/10; worst 8/10; best 2/10. Pain is the electrical, burning sensations only. Was previously road biking 3x/week, but is unable to do so currently. Is R handed. Pt denies N/V, B&B changes, unexplained weight fluctuation, saddle paresthesia, fever, night sweats, or unrelenting night pain at this time.   PAIN:  Are you having pain? Yes: NPRS scale: 4/10 Pain location: LUE shoulder to wrist Pain description: electrical, burning Aggravating factors: riding his bike, driving, holding his kindle, anything that makes him hold his arm out while gripping. Relieving factors: getting upright after laying down  PRECAUTIONS: None  WEIGHT BEARING RESTRICTIONS No  FALLS:  Has patient fallen in last 6 months? No  LIVING ENVIRONMENT: Lives with: lives with their family and lives with their spouse Lives in: House/apartment Stairs: No Has following equipment at home: None  OCCUPATION: retired  PLOF: Independent  PATIENT GOALS Be able to drive and ride his bike without pain  OBJECTIVE:   DIAGNOSTIC FINDINGS:  Xrays L shoulder, and bilat hands: negative  PATIENT SURVEYS:  FOTO 45 goal: 43  COGNITION:  Overall cognitive status: Within functional limits for tasks assessed     SENSATION: Normal sensation at baseline; with flared symptoms cannot feel hands  POSTURE: forward head, rounded shoulders,  increased thoracic kyphones  UPPER EXTREMITY ROM:   Bilat shoulder, elbow, wrist  UPPER EXTREMITY MMT:  MMT Right eval Left eval  Shoulder flexion 5 5  Shoulder extension 5 5  Shoulder abduction 5 5  Shoulder adduction    Shoulder internal rotation 5 5  Shoulder external rotation 5 5  Middle trapezius 4 4-  Lower trapezius 3+ 3+  Elbow flexion 5 5  Elbow extension 5 5  Wrist flexion 5 5  Wrist extension 5 5  Wrist ulnar deviation 5 5  Wrist radial deviation 5 5  Wrist pronation 5 5  Wrist supination 5 5  Grip strength (lbs) 78lbs 50lbs  (Blank rows = not tested)  SHOULDER SPECIAL TESTS:  Impingement tests: Neer impingement test: positive , Hawkins/Kennedy impingement test: positive , and Painful arc test: positive   SLAP lesions: Biceps load test: negative  Rotator cuff assessment: Drop arm test: negative, Empty can test: positive , Full can test: positive , External rotation lag sign: negative, Internal rotation lag sign: negative, and Infraspinatus test: positive   Biceps assessment: Speed's test: negative     Carpel Tunnel:  Phalens Test: POSITIVE  Carpel Compression Test: POSITIVE  Hoffmann-Tinel sign: POSITIVE   TOS:  Adsons: negative  Wright: negative  Roos Test: POSITIVE  Cyriax Release: negative Supraclavicular Pressure: POSITIVE Costoclavicular Maneuver: POSITIVE Median nerve: POSITIVE  Ulnar nerve: negative  Radial nerve: negative   JOINT MOBILITY TESTING:  Normal Increased nerve symptoms with 1st rib G3 glide (inf/med direction), subsides with sustained gliding  PALPATION:  Increased tension at pec major/minor, supraspinatus (concordant pain sign)   TODAY'S TREATMENT:  Manual STM with trigger point release to L UT, scalene group and pec major/minor Following: Dry Needling: (2/1/1) 41mm .25 needles placed along the bilat UT and L pec minor to decrease increased muscular spasms and trigger points with the patient positioned in supine with  pincer grasp technique. Patient was educated on risks and benefits of therapy and verbally consents to PT.   Grade 3 GHJ mob with ER 2x 30sec bouts  Ther-Ex Hook[place] with dowel overhead scaption x12 with cuing for scapulohumeral rhythm focus with good carry over Scaption at wall with T Cfor scapular movement for effiecient scapulohumeral complex with good carry over Y on wall 2x 10 with excellent carry over of technique [employer] with 1/2 foam roll at spine x12 with good carry over of cuing for technique Post shoulder rolls x12      PATIENT EDUCATION: Education details: Patient was educated on diagnosis, anatomy and pathology involved, prognosis, role of PT, and was given an HEP, demonstrating exercise with proper form following verbal and tactile cues, and was given a paper hand out to continue exercise at home. Pt was educated on and agreed to plan of care. Person educated: Patient Education method: Explanation, Demonstration, Verbal cues, and Handouts Education comprehension: verbalized understanding, returned demonstration, and verbal cues required   HOME EXERCISE PROGRAM: Median nerve glide 12-20x every hour or as symptoms arise Postural correction Scalene stretch x30sec 3x/day  ASSESSMENT:  CLINICAL IMPRESSION: PT initiated manual and therex techniques to restore neutral length/tension relationship of the scapuloumeral complex with success. Pt is able to comply with all cuing for proper technique of therex with good effort throughout session, without increase in symptoms. PT will continue progression as able.     OBJECTIVE IMPAIRMENTS Abnormal gait, decreased activity tolerance, decreased balance, decreased coordination, decreased endurance, decreased mobility, difficulty walking, decreased ROM, decreased  strength, decreased safety awareness, increased fascial restrictions, impaired flexibility, impaired sensation, impaired tone, impaired UE functional use, improper body  mechanics, postural dysfunction, and pain.   ACTIVITY LIMITATIONS carrying, lifting, sleeping, transfers, bathing, dressing, reach over head, and locomotion level  PARTICIPATION LIMITATIONS: meal prep, cleaning, laundry, driving, community activity, and yard work, driving  PERSONAL FACTORS Age, Fitness, Past/current experiences, Time since onset of injury/illness/exacerbation, and 1-2 comorbidities: HLD, anxiety  are also affecting patient's functional outcome.   REHAB POTENTIAL: Good  CLINICAL DECISION MAKING: Evolving/moderate complexity  EVALUATION COMPLEXITY: Moderate   GOALS: Goals reviewed with patient? Yes  SHORT TERM GOALS: Target date: 09/13/2022  (Remove Blue Hyperlink)  Pt will be independent with HEP in order to improve strength and balance in order to decrease fall risk and improve function at home and work.  Baseline: 08/06/22 HEP given Goal status: INITIAL   LONG TERM GOALS: Target date: 10/11/2022  (Remove Blue Hyperlink)  Patient will increase FOTO score to 63  to demonstrate predicted increase in functional mobility to complete ADLs  Baseline: 08/06/22 45 Goal status: INITIAL  2.  Pt will demonstrate periscapular strength of at least 4+/5 MMT grade in order to demonstrate improvement in strength and function  Baseline:  08/06/22 R/L Mid trap T 4/4-; Lower trap Y 3+/3+  Goal status: INITIAL  3.  Pt will demonstrate L grip strength of 70.2lbs or more in order to demonstrate 90% grip strength of dominant hand in order to complete heavy ADLs Baseline: 08/06/22 50lbs Goal status: INITIAL  4.  Pt will decrease worst pain as reported on NPRS by at least 3 points in order to demonstrate clinically significant reduction in pain.  Baseline: 08/06/22 8/10 Goal status: INITIAL    PLAN: PT FREQUENCY: 1-2x/week  PT DURATION: 8 weeks  PLANNED INTERVENTIONS: Therapeutic exercises, Therapeutic activity, Neuromuscular re-education, Balance training, Gait training,  Patient/Family education, Self Care, Joint mobilization, Joint manipulation, DME instructions, Dry Needling, Electrical stimulation, Spinal manipulation, Spinal mobilization, Cryotherapy, Moist heat, Taping, Traction, Ultrasound, Ionotophoresis 4mg /ml Dexamethasone, Manual therapy, and Re-evaluation  PLAN FOR NEXT SESSION: HEP review; TDN   DPT Hilda Lias, PT 08/16/2022, 10:16 AM

## 2022-08-18 ENCOUNTER — Encounter: Payer: PPO | Admitting: Physical Therapy

## 2022-08-19 ENCOUNTER — Ambulatory Visit (INDEPENDENT_AMBULATORY_CARE_PROVIDER_SITE_OTHER): Payer: PPO

## 2022-08-19 ENCOUNTER — Ambulatory Visit: Payer: PPO | Admitting: Family Medicine

## 2022-08-19 ENCOUNTER — Ambulatory Visit: Payer: PPO | Admitting: Physical Therapy

## 2022-08-19 ENCOUNTER — Ambulatory Visit: Payer: Self-pay

## 2022-08-19 ENCOUNTER — Encounter: Payer: Self-pay | Admitting: Family Medicine

## 2022-08-19 VITALS — BP 128/78 | HR 98 | Ht 70.0 in | Wt 186.0 lb

## 2022-08-19 DIAGNOSIS — M791 Myalgia, unspecified site: Secondary | ICD-10-CM

## 2022-08-19 DIAGNOSIS — M5412 Radiculopathy, cervical region: Secondary | ICD-10-CM | POA: Diagnosis not present

## 2022-08-19 DIAGNOSIS — R202 Paresthesia of skin: Secondary | ICD-10-CM

## 2022-08-19 DIAGNOSIS — M25511 Pain in right shoulder: Secondary | ICD-10-CM

## 2022-08-19 DIAGNOSIS — M25512 Pain in left shoulder: Secondary | ICD-10-CM | POA: Diagnosis not present

## 2022-08-19 DIAGNOSIS — M25551 Pain in right hip: Secondary | ICD-10-CM

## 2022-08-19 DIAGNOSIS — R200 Anesthesia of skin: Secondary | ICD-10-CM | POA: Diagnosis not present

## 2022-08-19 DIAGNOSIS — M4312 Spondylolisthesis, cervical region: Secondary | ICD-10-CM | POA: Diagnosis not present

## 2022-08-19 LAB — COMPREHENSIVE METABOLIC PANEL
ALT: 15 U/L (ref 0–53)
AST: 17 U/L (ref 0–37)
Albumin: 4 g/dL (ref 3.5–5.2)
Alkaline Phosphatase: 98 U/L (ref 39–117)
BUN: 24 mg/dL — ABNORMAL HIGH (ref 6–23)
CO2: 25 mEq/L (ref 19–32)
Calcium: 9.4 mg/dL (ref 8.4–10.5)
Chloride: 102 mEq/L (ref 96–112)
Creatinine, Ser: 0.84 mg/dL (ref 0.40–1.50)
GFR: 87.49 mL/min (ref >60.00)
Glucose, Bld: 148 mg/dL — ABNORMAL HIGH (ref 70–99)
Potassium: 4.4 mEq/L (ref 3.5–5.1)
Sodium: 135 mEq/L (ref 135–145)
Total Bilirubin: 0.4 mg/dL (ref 0.2–1.2)
Total Protein: 7.5 g/dL (ref 6.0–8.3)

## 2022-08-19 LAB — C-REACTIVE PROTEIN: CRP: 5.9 mg/dL (ref 0.5–20.0)

## 2022-08-19 LAB — SEDIMENTATION RATE: Sed Rate: 90 mm/hr — ABNORMAL HIGH (ref 0–20)

## 2022-08-19 LAB — CK: Total CK: 50 U/L (ref 7–232)

## 2022-08-19 MED ORDER — PREDNISONE 10 MG (48) PO TBPK: ORAL_TABLET | Freq: Every day | ORAL | 0 refills | Status: DC

## 2022-08-19 MED ORDER — GABAPENTIN 300 MG PO CAPS: 300.0000 mg | ORAL_CAPSULE | Freq: Three times a day (TID) | ORAL | 3 refills | Status: DC | PRN

## 2022-08-19 NOTE — Therapy (Signed)
OUTPATIENT PHYSICAL THERAPY SHOULDER TREATMENT   Patient Name: Jim Mathis. MRN: [id number] DOB:1951-01-22, 71 y.o., male Today's Date: 08/20/2022   PT End of Session - 08/20/22 0818     Visit Number 5    Number of Visits 17    Date for PT Re-Evaluation 10/15/22    Authorization - Visit Number 5    Authorization - Number of Visits 10    Progress Note Due on Visit 10    PT Start Time 0915    PT Stop Time 1000    PT Time Calculation (min) 45 min    Activity Tolerance Patient tolerated treatment well    Behavior During Therapy WFL for tasks assessed/performed                 Past Medical History:  Diagnosis Date   BPH with obstruction/lower urinary tract symptoms    Carotid stenosis, right    per pt was told he has some stenosis but not enough for surgery;   in epic under media ,  carotid ultrasound result right ICA 50-69% and incidental finding thyroid nodule   Cervicalgia    ED (erectile dysfunction)    History of colon resection    per pt 12 hours after partial knee arthroplasty 2017,  s/p partial colectomy was possible from divertiulitis   Hyperlipidemia    Hypertension    Migraines    PAD (peripheral artery disease) (HCC) 08-14-2019  per pt when he walks (walks 2 miles a day) right foot gets numb but recovers quickly and when he rides his bike left hand gets numb by recovers quickly ;   denies claudication or swelling   per pt s/p  stenting to bilateral lower legs in [place] one in 2003 and the other 2007,  no follow up since moved to [place]   Wears glasses    Past Surgical History:  Procedure Laterality Date   COLOSTOMY REVERSAL  02-21-2017     [place], [place] (OR record scanned in epic)   w/ sigmoid and descending colectomy with appendectomy   CYSTOSCOPY WITH INJECTION N/A 12/19/2019   Procedure: CYSTOSCOPY WITH INJECTION;  Surgeon: Sebastian Ache, MD;  Location: [facility];  Service: Urology;  Laterality: N/A;   CYSTOSCOPY WITH URETHRAL  DILATATION N/A 12/19/2019   Procedure: CYSTOSCOPY WITH URETHRAL DILATATION;  Surgeon: Sebastian Ache, MD;  Location: [facility];  Service: Urology;  Laterality: N/A;  45 MINS   ENDOVASCULAR STENT INSERTION  2003 and 2007  --- both done in [place]   bilateral legs   PARTIAL COLECTOMY  09-04-2016  in [place], [place]   w/  colostomy for perferated divierticulitis   PARTIAL KNEE ARTHROPLASTY Left 2017   TOTAL KNEE ARTHROPLASTY Right 2012   TRANSURETHRAL RESECTION OF PROSTATE N/A 08/15/2019   Procedure: TRANSURETHRAL RESECTION OF THE PROSTATE (TURP);  Surgeon: Sebastian Ache, MD;  Location: [facility];  Service: Urology;  Laterality: N/A;   Patient Active Problem List   Diagnosis Date Noted   Paresthesias 07/19/2022   SBO (small bowel obstruction) (HCC) 05/08/2021   PAD (peripheral artery disease) (HCC)    Actinic keratosis 05/06/2020   Nonintractable headache 08/22/2019   BPH with ED and OAB 05/24/2019   Essential hypertension 05/24/2019   Dyslipidemia 05/24/2019   Anxiety 05/24/2019   Seborrheic dermatitis 05/24/2019   Osteoarthritis 07/13/2012    PCP: Jacquiline Doe MD  REFERRING PROVIDER: Earma Reading MD  REFERRING DIAG: L shoulder pain   THERAPY DIAG:  Acute  pain of left shoulder  Pain in right hip  Rationale for Evaluation and Treatment Rehabilitation  ONSET DATE: May 2023  SUBJECTIVE:                                                                                                                                                                                      SUBJECTIVE STATEMENT: Pt reports everything is worse this am. Micah Flesher for a hike yesterday and noticed pain was worse after this. His pain was worse through the night, kept him up at night. Over the past couple days his R shoulder/wrist has been just as bad as the L. Feels stiffness in the neck as well. Has a tension headache at this time. Reports pain in bilat Ues 7/10. More severe on R  slightly.   PERTINENT HISTORY: Pt familiar with this clinic, recent d/c for R hip pain, being seen today for L shoulder pain. Pain with insidious onset. Has had carpel tunnel at the wrist on and off for years in L wrist. Has "nerve pain" pins and needles down the arm that feels like it starts in the shoulder and comes down the front of the UE to the palm side of the ring and middle finger. Currently is having L carpel tunnel pain that is severely debilitating. Pain is made worse by riding his bike, driving, holding his kindle, anything that makes him hold his arm out while gripping. Has trouble sleeping, wakes up every hour with pain coming down the arm that is worst in the wrist. Current pain 4/10; worst 8/10; best 2/10. Pain is the electrical, burning sensations only. Was previously road biking 3x/week, but is unable to do so currently. Is R handed. Pt denies N/V, B&B changes, unexplained weight fluctuation, saddle paresthesia, fever, night sweats, or unrelenting night pain at this time.   PAIN:  Are you having pain? Yes: NPRS scale: 4/10 Pain location: LUE shoulder to wrist Pain description: electrical, burning Aggravating factors: riding his bike, driving, holding his kindle, anything that makes him hold his arm out while gripping. Relieving factors: getting upright after laying down  PRECAUTIONS: None  WEIGHT BEARING RESTRICTIONS No  FALLS:  Has patient fallen in last 6 months? No  LIVING ENVIRONMENT: Lives with: lives with their family and lives with their spouse Lives in: House/apartment Stairs: No Has following equipment at home: None  OCCUPATION: retired  PLOF: Independent  PATIENT GOALS Be able to drive and ride his bike without pain  OBJECTIVE:   DIAGNOSTIC FINDINGS:  Xrays L shoulder, and bilat hands: negative  PATIENT SURVEYS:  FOTO 45 goal: 70  COGNITION:  Overall cognitive status: Within functional limits for tasks  assessed     SENSATION: Normal sensation  at baseline; with flared symptoms cannot feel hands  POSTURE: forward head, rounded shoulders, increased thoracic kyphones  UPPER EXTREMITY ROM:   Bilat shoulder, elbow, wrist  UPPER EXTREMITY MMT:  MMT Right eval Left eval  Shoulder flexion 5 5  Shoulder extension 5 5  Shoulder abduction 5 5  Shoulder adduction    Shoulder internal rotation 5 5  Shoulder external rotation 5 5  Middle trapezius 4 4-  Lower trapezius 3+ 3+  Elbow flexion 5 5  Elbow extension 5 5  Wrist flexion 5 5  Wrist extension 5 5  Wrist ulnar deviation 5 5  Wrist radial deviation 5 5  Wrist pronation 5 5  Wrist supination 5 5  Grip strength (lbs) 78lbs 50lbs  (Blank rows = not tested)  SHOULDER SPECIAL TESTS:  Impingement tests: Neer impingement test: positive , Hawkins/Kennedy impingement test: positive , and Painful arc test: positive   SLAP lesions: Biceps load test: negative  Rotator cuff assessment: Drop arm test: negative, Empty can test: positive , Full can test: positive , External rotation lag sign: negative, Internal rotation lag sign: negative, and Infraspinatus test: positive   Biceps assessment: Speed's test: negative     Carpel Tunnel:  Phalens Test: POSITIVE  Carpel Compression Test: POSITIVE  Hoffmann-Tinel sign: POSITIVE   TOS:  Adsons: negative  Wright: negative  Roos Test: POSITIVE  Cyriax Release: negative Supraclavicular Pressure: POSITIVE Costoclavicular Maneuver: POSITIVE Median nerve: POSITIVE  Ulnar nerve: negative  Radial nerve: negative   JOINT MOBILITY TESTING:  Normal Increased nerve symptoms with 1st rib G3 glide (inf/med direction), subsides with sustained gliding  PALPATION:  Increased tension at pec major/minor, supraspinatus (concordant pain sign)   TODAY'S TREATMENT:  Manual STM with trigger point release to upper cervical exts,bilat UT and leavtor Following: Dry Needling: (1/1/1) 25mm .25 needles placed along the bilat UT and L levator to  decrease increased muscular spasms and trigger points with the patient positioned in prone with pincer grasp technique. Patient was educated on risks and benefits of therapy and verbally consents to PT.   Grade 2 CPA C0-3 2x 30sec bouts  Manual Traction x10sec on 10sec off x6; with lateral bias (same)  Ther-Ex Supine cervical retraction with upper cervical flex focus x12   - In standing with wall behind head w/ scapular retraction with good carry over PT reviewed the following HEP with patient with patient able to demonstrate a set of the following with min cuing for correction needed. PT educated patient on parameters of therex (how/when to inc/decrease intensity, frequency, rep/set range, stretch hold time, and purpose of therex) with verbalized understanding.   Access Code: [email or web address] - Seated Passive Cervical Retraction  - 8 x daily - 7 x weekly - 12 reps - Seated Levator Scapulae Stretch  - 1-3 x daily - 7 x weekly - 30-60sec hold - Seated Upper Trapezius Stretch  - 1-3 x daily - 7 x weekly - 30-60sec hold     PATIENT EDUCATION: Education details: Patient was educated on diagnosis, anatomy and pathology involved, prognosis, role of PT, and was given an HEP, demonstrating exercise with proper form following verbal and tactile cues, and was given a paper hand out to continue exercise at home. Pt was educated on and agreed to plan of care. Person educated: Patient Education method: Explanation, Demonstration, Verbal cues, and Handouts Education comprehension: verbalized understanding, returned demonstration, and verbal cues required   HOME EXERCISE PROGRAM: Median nerve  glide 12-20x every hour or as symptoms arise Postural correction Scalene stretch x30sec 3x/day  ASSESSMENT:  CLINICAL IMPRESSION: Pt arriving to clinic with increased signs of cervical radiculopathy/stenosis > double crush syndrome. Still evident carpel tunnel bilat. Pt pain in bilat shoulders with LUE radicular with  treatment aimed at cervical spine tension relief, joint mobility, and postural correction. Patient reports no pain or symptoms following manual techniques. Patient is able to comply with all cuing for proper technique of therex and demonstrate and verbalize understanding of HEP updates to continue this pain reduction.  PT will continue progression as able.     OBJECTIVE IMPAIRMENTS Abnormal gait, decreased activity tolerance, decreased balance, decreased coordination, decreased endurance, decreased mobility, difficulty walking, decreased ROM, decreased strength, decreased safety awareness, increased fascial restrictions, impaired flexibility, impaired sensation, impaired tone, impaired UE functional use, improper body mechanics, postural dysfunction, and pain.   ACTIVITY LIMITATIONS carrying, lifting, sleeping, transfers, bathing, dressing, reach over head, and locomotion level  PARTICIPATION LIMITATIONS: meal prep, cleaning, laundry, driving, community activity, and yard work, driving  PERSONAL FACTORS Age, Fitness, Past/current experiences, Time since onset of injury/illness/exacerbation, and 1-2 comorbidities: HLD, anxiety  are also affecting patient's functional outcome.   REHAB POTENTIAL: Good  CLINICAL DECISION MAKING: Evolving/moderate complexity  EVALUATION COMPLEXITY: Moderate   GOALS: Goals reviewed with patient? Yes  SHORT TERM GOALS: Target date: 09/17/2022  (Remove Blue Hyperlink)  Pt will be independent with HEP in order to improve strength and balance in order to decrease fall risk and improve function at home and work.  Baseline: 08/06/22 HEP given Goal status: INITIAL   LONG TERM GOALS: Target date: 10/15/2022  (Remove Blue Hyperlink)  Patient will increase FOTO score to 63  to demonstrate predicted increase in functional mobility to complete ADLs  Baseline: 08/06/22 45 Goal status: INITIAL  2.  Pt will demonstrate periscapular strength of at least 4+/5 MMT grade in  order to demonstrate improvement in strength and function  Baseline:  08/06/22 R/L Mid trap T 4/4-; Lower trap Y 3+/3+  Goal status: INITIAL  3.  Pt will demonstrate L grip strength of 70.2lbs or more in order to demonstrate 90% grip strength of dominant hand in order to complete heavy ADLs Baseline: 08/06/22 50lbs Goal status: INITIAL  4.  Pt will decrease worst pain as reported on NPRS by at least 3 points in order to demonstrate clinically significant reduction in pain.  Baseline: 08/06/22 8/10 Goal status: INITIAL    PLAN: PT FREQUENCY: 1-2x/week  PT DURATION: 8 weeks  PLANNED INTERVENTIONS: Therapeutic exercises, Therapeutic activity, Neuromuscular re-education, Balance training, Gait training, Patient/Family education, Self Care, Joint mobilization, Joint manipulation, DME instructions, Dry Needling, Electrical stimulation, Spinal manipulation, Spinal mobilization, Cryotherapy, Moist heat, Taping, Traction, Ultrasound, Ionotophoresis 4mg /ml Dexamethasone, Manual therapy, and Re-evaluation  PLAN FOR NEXT SESSION: HEP review; TDN   DPT Hilda Lias, PT 08/20/2022, 9:25 AM

## 2022-08-19 NOTE — Progress Notes (Signed)
I, [name], LAT, ATC acting as a scribe for [name], MD.  Jim Mathis. is a 71 y.o. male who presents to [facility] today for f/u of worsening symptoms and pain. Pt has been seen previously by Dr. Denyse Amass on 08/09/22 and 07/27/22 for L CTS and L shoulder pain. Pt was given a L carpal tunnel steroid injection on 8/21 and on 8/8 pt was referred to PT for his L shoulder, completing 4 visits. Therapy helps for a day or so. Today, pt reports that pain is now present in both shoulder and b wrists. injection did help but pain has returned. Worse at night. Now having a hard time unplugging iphone or opening caps on medication bottles.   He notes pain is like a cape on his proximal shoulders and neck.  He responded very well to prednisone but it was somewhat short-lived.  Dx testing: 06/25/22 R & L hand XR, bilat hips and L shoulder XR             07/03/19 NCV study  Pertinent review of systems: No fevers or chills.  No rash.  Relevant historical information: PAD.  History of cervical neuroforaminal stenosis on prior MRI 2020.  Mild carpal tunnel syndrome prior EMG 2020.   Exam:  BP 128/78   Pulse 98   Ht 5\' 10"  (1.778 m)   Wt 186 lb (84.4 kg)   SpO2 98%   BMI 26.69 kg/m  General: Well Developed, well nourished, and in no acute distress.   MSK: C-spine: Normal appearing Nontender midline. Decreased cervical motion. Upper extremity strength decreased shoulder abduction/C5 4/5.  Otherwise upper extremity strength is equal and normal throughout. Reflexes and sensation are intact bilaterally.  Right shoulder: Normal-appearing normal motion.  Strength as above otherwise intact. Mildly positive Hawkins and Neer's test.  Left shoulder: Normal-appearing Normal motion. Strength intact otherwise.  Weak to abduction as above. Mildly positive Hawkins and Neer's test.  Positive Tinel's and Phalen's test bilateral carpal tunnel.    Lab and Radiology  Results  X-ray images C-spine and right shoulder obtained today personally and independently interpreted.  C-spine: Diffuse multilevel DDD.  No acute fractures.  Right shoulder: Mild calcific change superior portion of glenoid appears rounded.  Mild AC DJD.  No acute fractures.  Await formal radiology review     Assessment and Plan: 71 y.o. male with bilateral shoulder and shoulder girdle pain associated with bilateral upper extremity paresthesias or cervical radiculopathy.  Symptoms are escalating and concerning.  Differential is broad and could include bad cervical radiculopathy.  Based on his MRI from 2020 he certainly has multiple levels that could cause nerve impingement and could cause similar symptoms that he is having now.  Plan for MRI cervical spine to further evaluate for potential cervical radiculopathy or even myelopathy. Additionally will give gabapentin for temporary control of paresthesia or radicular pain.  He also has paresthesias upper extremities that are most consistent with worsening carpal tunnel syndrome.  He did have mild carpal tunnel syndrome on prior nerve conduction study from 3 years ago.  We will go ahead and proceed to repeat nerve conduction study.  If we get a clear answer with the below or above work-up we may be able to pause or cancel the nerve conduction study but for now this is probably a good option.  Lastly he is having worsening myalgias and arthralgias especially along his proximal muscle groups.  This responded previously to steroids and  is concerning for polymyalgia rheumatica.  He does not have a rash that is concerning for dermatomyositis currently but that is a similar condition that may also be present. Plan for rheumatologic work-up listed below.  Of note he was given a steroid injection a week ago which may confound or give false negatives to this work-up.  We will go ahead and give him a course of oral prednisone now since he had good  response to it previously.  He is a scheduled follow-up with me on September 19.  He should check back with me on that day or sooner based on the results of his MRI.  Severe worsening symptoms with unclear etiology at this time.   PDMP not reviewed this encounter. Orders Placed This Encounter  Procedures   DG Cervical Spine 2 or 3 views    Standing Status:   Future    Number of Occurrences:   1    Standing Expiration Date:   08/20/2023    Order Specific Question:   Reason for Exam (SYMPTOM  OR DIAGNOSIS REQUIRED)    Answer:   eval cervical rad BL    Order Specific Question:   Preferred imaging location?    Answer:   [facility]   MR CERVICAL SPINE WO CONTRAST    Standing Status:   Future    Standing Expiration Date:   08/20/2023    Order Specific Question:   What is the patient's sedation requirement?    Answer:   No Sedation    Order Specific Question:   Does the patient have a pacemaker or implanted devices?    Answer:   No    Order Specific Question:   Preferred imaging location?    Answer:   [facility] (table limit-550lbs)   DG Shoulder Right    Standing Status:   Future    Number of Occurrences:   1    Standing Expiration Date:   08/20/2023    Order Specific Question:   Reason for Exam (SYMPTOM  OR DIAGNOSIS REQUIRED)    Answer:   eval shoulder pain r    Order Specific Question:   Preferred imaging location?    Answer:   [facility]   Comprehensive metabolic panel    Standing Status:   Future    Number of Occurrences:   1    Standing Expiration Date:   08/20/2023   Sedimentation rate    Standing Status:   Future    Number of Occurrences:   1    Standing Expiration Date:   08/20/2023   ANA    Standing Status:   Future    Number of Occurrences:   1    Standing Expiration Date:   08/20/2023   Cyclic citrul peptide antibody, IgG    Standing Status:   Future    Number of Occurrences:   1    Standing Expiration Date:   08/20/2023   CK    Standing  Status:   Future    Number of Occurrences:   1    Standing Expiration Date:   08/20/2023   Rheumatoid factor    Standing Status:   Future    Number of Occurrences:   1    Standing Expiration Date:   08/20/2023   C-reactive protein    Standing Status:   Future    Number of Occurrences:   1    Standing Expiration Date:   08/20/2023   Ambulatory referral to  Neurology    Referral Priority:   Routine    Referral Type:   Consultation    Referral Reason:   Specialty Services Required    Requested Specialty:   Neurology    Number of Visits Requested:   1   NCV with EMG(electromyography)    Standing Status:   Future    Standing Expiration Date:   08/20/2023    Order Specific Question:   Where should this test be performed?    Answer:   [facility]   Meds ordered this encounter  Medications   gabapentin (NEURONTIN) 300 MG capsule    Sig: Take 1 capsule (300 mg total) by mouth 3 (three) times daily as needed.    Dispense:  90 capsule    Refill:  3   predniSONE (STERAPRED UNI-PAK 48 TAB) 10 MG (48) TBPK tablet    Sig: Take by mouth daily. 12 day dosepack po    Dispense:  48 tablet    Refill:  0     Discussed warning signs or symptoms. Please see discharge instructions. Patient expresses understanding.   The above documentation has been reviewed and is accurate and complete [name], M.D.

## 2022-08-19 NOTE — Patient Instructions (Addendum)
Thank you for coming in today.   Please get an Xray today before you leave   Please get labs today before you leave   You should hear from MRI scheduling within 1 week. If you do not hear please let me know.    I've sent a prescription for Gabapentin and Prednisone to your pharmacy.   I've ordered a nerve conduction study. You should hear about scheduling soon.  See you back on the 19th or after the MRI

## 2022-08-20 ENCOUNTER — Encounter: Payer: Self-pay | Admitting: Family Medicine

## 2022-08-20 NOTE — Progress Notes (Signed)
Right shoulder x-ray shows mild to medium arthritis changes.

## 2022-08-20 NOTE — Progress Notes (Signed)
Cervical spine x-ray shows arthritis changes in the neck.

## 2022-08-20 NOTE — Addendum Note (Signed)
Addended by: Rodolph Bong on: 08/20/2022 06:25 AM   Modules accepted: Orders

## 2022-08-20 NOTE — Progress Notes (Signed)
Lab results are back.  Sedimentation rate is significantly elevated indicating that polymyalgia rheumatica is quite likely.  You are currently on the right treatment with prednisone.  I will refer you to rheumatology now.  You should hear soon about scheduling.  Expect a phone call from [facility] rheumatology.

## 2022-08-21 LAB — CYCLIC CITRUL PEPTIDE ANTIBODY, IGG: Cyclic Citrullin Peptide Ab: <16 UNITS

## 2022-08-21 LAB — RHEUMATOID FACTOR: Rheumatoid fact SerPl-aCnc: <14 IU/mL (ref <14)

## 2022-08-21 LAB — ANA: Anti Nuclear Antibody (ANA): NEGATIVE

## 2022-08-24 ENCOUNTER — Other Ambulatory Visit: Payer: Self-pay | Admitting: Family Medicine

## 2022-08-24 ENCOUNTER — Ambulatory Visit: Payer: PPO | Admitting: Physical Therapy

## 2022-08-24 NOTE — Progress Notes (Signed)
Remaining labs are negative

## 2022-08-26 ENCOUNTER — Ambulatory Visit: Payer: PPO | Admitting: Physical Therapy

## 2022-08-29 ENCOUNTER — Ambulatory Visit
Admission: RE | Admit: 2022-08-29 | Discharge: 2022-08-29 | Disposition: A | Discharge status: Home / Self Care | Payer: PPO | Source: Ambulatory Visit | Attending: Family Medicine | Admitting: Family Medicine

## 2022-08-29 DIAGNOSIS — R202 Paresthesia of skin: Secondary | ICD-10-CM

## 2022-08-29 DIAGNOSIS — M25512 Pain in left shoulder: Secondary | ICD-10-CM

## 2022-08-29 DIAGNOSIS — M791 Myalgia, unspecified site: Secondary | ICD-10-CM

## 2022-08-29 DIAGNOSIS — M4802 Spinal stenosis, cervical region: Secondary | ICD-10-CM | POA: Diagnosis not present

## 2022-08-29 DIAGNOSIS — M25511 Pain in right shoulder: Secondary | ICD-10-CM

## 2022-08-29 DIAGNOSIS — M5412 Radiculopathy, cervical region: Secondary | ICD-10-CM

## 2022-08-30 ENCOUNTER — Encounter: Payer: PPO | Admitting: Physical Therapy

## 2022-08-31 ENCOUNTER — Ambulatory Visit (INDEPENDENT_AMBULATORY_CARE_PROVIDER_SITE_OTHER): Payer: PPO | Admitting: Family Medicine

## 2022-08-31 ENCOUNTER — Encounter: Payer: Self-pay | Admitting: Family Medicine

## 2022-08-31 VITALS — BP 128/69 | HR 94 | Temp 97.2°F | Ht 70.0 in | Wt 191.0 lb

## 2022-08-31 DIAGNOSIS — M353 Polymyalgia rheumatica: Secondary | ICD-10-CM | POA: Diagnosis not present

## 2022-08-31 DIAGNOSIS — M549 Dorsalgia, unspecified: Secondary | ICD-10-CM | POA: Diagnosis not present

## 2022-08-31 DIAGNOSIS — E785 Hyperlipidemia, unspecified: Secondary | ICD-10-CM

## 2022-08-31 DIAGNOSIS — Z1159 Encounter for screening for other viral diseases: Secondary | ICD-10-CM

## 2022-08-31 DIAGNOSIS — N400 Enlarged prostate without lower urinary tract symptoms: Secondary | ICD-10-CM | POA: Diagnosis not present

## 2022-08-31 DIAGNOSIS — M1990 Unspecified osteoarthritis, unspecified site: Secondary | ICD-10-CM

## 2022-08-31 DIAGNOSIS — M419 Scoliosis, unspecified: Secondary | ICD-10-CM | POA: Diagnosis not present

## 2022-08-31 DIAGNOSIS — I10 Essential (primary) hypertension: Secondary | ICD-10-CM | POA: Diagnosis not present

## 2022-08-31 DIAGNOSIS — F419 Anxiety disorder, unspecified: Secondary | ICD-10-CM | POA: Diagnosis not present

## 2022-08-31 DIAGNOSIS — Z0001 Encounter for general adult medical examination with abnormal findings: Secondary | ICD-10-CM

## 2022-08-31 DIAGNOSIS — Z23 Encounter for immunization: Secondary | ICD-10-CM | POA: Diagnosis not present

## 2022-08-31 DIAGNOSIS — M79606 Pain in leg, unspecified: Secondary | ICD-10-CM | POA: Diagnosis not present

## 2022-08-31 LAB — HEMOGLOBIN A1C: Hgb A1c MFr Bld: 6.1 % (ref 4.6–6.5)

## 2022-08-31 LAB — CBC
HCT: 41.8 % (ref 39.0–52.0)
Hemoglobin: 13.8 g/dL (ref 13.0–17.0)
MCHC: 32.9 g/dL (ref 30.0–36.0)
MCV: 89.3 fl (ref 78.0–100.0)
Platelets: 226 10*3/uL (ref 150.0–400.0)
RBC: 4.68 Mil/uL (ref 4.22–5.81)
RDW: 14.8 % (ref 11.5–15.5)
WBC: 12.4 10*3/uL — ABNORMAL HIGH (ref 4.0–10.5)

## 2022-08-31 LAB — LIPID PANEL
Cholesterol: 181 mg/dL (ref 0–200)
HDL: 75.8 mg/dL (ref >39.00)
LDL Cholesterol: 85 mg/dL (ref 0–99)
NonHDL: 105.18
Total CHOL/HDL Ratio: 2
Triglycerides: 101 mg/dL (ref 0.0–149.0)
VLDL: 20.2 mg/dL (ref 0.0–40.0)

## 2022-08-31 LAB — COMPREHENSIVE METABOLIC PANEL
ALT: 19 U/L (ref 0–53)
AST: 14 U/L (ref 0–37)
Albumin: 3.7 g/dL (ref 3.5–5.2)
Alkaline Phosphatase: 68 U/L (ref 39–117)
BUN: 30 mg/dL — ABNORMAL HIGH (ref 6–23)
CO2: 23 mEq/L (ref 19–32)
Calcium: 8.9 mg/dL (ref 8.4–10.5)
Chloride: 103 mEq/L (ref 96–112)
Creatinine, Ser: 0.76 mg/dL (ref 0.40–1.50)
GFR: 90.15 mL/min (ref >60.00)
Glucose, Bld: 86 mg/dL (ref 70–99)
Potassium: 4.6 mEq/L (ref 3.5–5.1)
Sodium: 136 mEq/L (ref 135–145)
Total Bilirubin: 0.4 mg/dL (ref 0.2–1.2)
Total Protein: 6.7 g/dL (ref 6.0–8.3)

## 2022-08-31 LAB — TSH: TSH: 1.09 u[IU]/mL (ref 0.35–5.50)

## 2022-08-31 NOTE — Assessment & Plan Note (Signed)
Check lipids.  He is on Lipitor 40 mg daily. 

## 2022-08-31 NOTE — Progress Notes (Signed)
Chief Complaint:  Jim Mathis. is a 71 y.o. male who presents today for his annual comprehensive physical exam.    Assessment/Plan:  Chronic Problems Addressed Today: Osteoarthritis Continue management per sports medicine.  Concern for possible PMR.  He had significant improvement with prednisone.  He will be following up with rheumatology later today.  Anxiety Stable on doxepin 25 mg daily.  Dyslipidemia Check lipids.  He is on Lipitor 40 mg daily.  Essential hypertension At goal on amlodipine 10 mg daily.  BPH with ED and OAB Follows with urology.  Symptoms are stable on Cialis 20 mg daily and finasteride 5 mg daily.  Preventative Healthcare: Flu shot given today. Check labs. UTD on vaccines.   Patient Counseling(The following topics were reviewed and/or handout was given):  -Nutrition: Stressed importance of moderation in sodium/caffeine intake, saturated fat and cholesterol, caloric balance, sufficient intake of fresh fruits, vegetables, and fiber.  -Stressed the importance of regular exercise.   -Substance Abuse: Discussed cessation/primary prevention of tobacco, alcohol, or other drug use; driving or other dangerous activities under the influence; availability of treatment for abuse.   -Injury prevention: Discussed safety belts, safety helmets, smoke detector, smoking near bedding or upholstery.   -Sexuality: Discussed sexually transmitted diseases, partner selection, use of condoms, avoidance of unintended pregnancy and contraceptive alternatives.   -Dental health: Discussed importance of regular tooth brushing, flossing, and dental visits.  -Health maintenance and immunizations reviewed. Please refer to Health maintenance section.  Return to care in 1 year for next preventative visit.     Subjective:  HPI:  He has no acute complaints today. See A/p for status of chronic conditions.   Lifestyle Diet: Balanced. Plenty of fruits and vegetables.  Exercise:  Exercises 3-4 days per week. Does a lot of walking.      08/31/2022    8:54 AM  Depression screen PHQ 2/9  Decreased Interest 0  Down, Depressed, Hopeless 0  PHQ - 2 Score 0    Health Maintenance Due  Topic Date Due   Hepatitis C Screening  Never done     ROS: Per HPI, otherwise a complete review of systems was negative.   PMH:  The following were reviewed and entered/updated in epic: Past Medical History:  Diagnosis Date   BPH with obstruction/lower urinary tract symptoms    Carotid stenosis, right    per pt was told he has some stenosis but not enough for surgery;   in epic under media ,  carotid ultrasound result right ICA 50-69% and incidental finding thyroid nodule   Cervicalgia    ED (erectile dysfunction)    History of colon resection    per pt 12 hours after partial knee arthroplasty 2017,  s/p partial colectomy was possible from divertiulitis   Hyperlipidemia    Hypertension    Migraines    PAD (peripheral artery disease) (HCC) 08-14-2019  per pt when he walks (walks 2 miles a day) right foot gets numb but recovers quickly and when he rides his bike left hand gets numb by recovers quickly ;   denies claudication or swelling   per pt s/p  stenting to bilateral lower legs in [place] one in 2003 and the other 2007,  no follow up since moved to [facility]   Wears glasses    Patient Active Problem List   Diagnosis Date Noted   Paresthesias 07/19/2022   SBO (small bowel obstruction) (HCC) 05/08/2021   PAD (peripheral artery disease) (HCC)    Actinic  keratosis 05/06/2020   Nonintractable headache 08/22/2019   BPH with ED and OAB 05/24/2019   Essential hypertension 05/24/2019   Dyslipidemia 05/24/2019   Anxiety 05/24/2019   Seborrheic dermatitis 05/24/2019   Osteoarthritis 07/13/2012   Past Surgical History:  Procedure Laterality Date   COLOSTOMY REVERSAL  02-21-2017     [place], [place] (OR record scanned in epic)   w/ sigmoid and descending colectomy with appendectomy    CYSTOSCOPY WITH INJECTION N/A 12/19/2019   Procedure: CYSTOSCOPY WITH INJECTION;  Surgeon: Sebastian Ache, MD;  Location: [facility];  Service: Urology;  Laterality: N/A;   CYSTOSCOPY WITH URETHRAL DILATATION N/A 12/19/2019   Procedure: CYSTOSCOPY WITH URETHRAL DILATATION;  Surgeon: Sebastian Ache, MD;  Location: [facility];  Service: Urology;  Laterality: N/A;  45 MINS   ENDOVASCULAR STENT INSERTION  2003 and 2007  --- both done in [place]   bilateral legs   PARTIAL COLECTOMY  09-04-2016  in [place], [place]   w/  colostomy for perferated divierticulitis   PARTIAL KNEE ARTHROPLASTY Left 2017   TOTAL KNEE ARTHROPLASTY Right 2012   TRANSURETHRAL RESECTION OF PROSTATE N/A 08/15/2019   Procedure: TRANSURETHRAL RESECTION OF THE PROSTATE (TURP);  Surgeon: Sebastian Ache, MD;  Location: [facility];  Service: Urology;  Laterality: N/A;    Family History  Problem Relation Age of Onset   Alzheimer's disease Mother    Lung cancer Father        non smoker    Medications- reviewed and updated Current Outpatient Medications  Medication Sig Dispense Refill   amLODipine (NORVASC) 10 MG tablet Take 1 tablet by mouth daily 90 tablet 1   atorvastatin (LIPITOR) 40 MG tablet Take 1 tablet by mouth daily 90 tablet 3   doxepin (SINEQUAN) 25 MG capsule Take 1 capsule by mouth daily 90 capsule 1   finasteride (PROSCAR) 5 MG tablet Take 1 tablet (5 mg total) by mouth at bedtime. 90 tablet 3   gabapentin (NEURONTIN) 300 MG capsule Take 1 capsule (300 mg total) by mouth 3 (three) times daily as needed. 90 capsule 3   meloxicam (MOBIC) 15 MG tablet TAKE 1 TABLET (15 MG TOTAL) BY MOUTH DAILY. 30 tablet 0   Naphazoline-Pheniramine (ALLERGY EYE OP) Place 1 drop into both eyes daily as needed.     predniSONE (STERAPRED UNI-PAK 48 TAB) 10 MG (48) TBPK tablet Take by mouth daily. 12 day dosepack po 48 tablet 0   tadalafil (CIALIS) 20 MG tablet TAKE ONE TABLET BY MOUTH DAILY  90 tablet 3   tizanidine (ZANAFLEX) 2 MG capsule Take 1 capsule (2 mg total) by mouth 3 (three) times daily. 90 capsule 2   triamcinolone ointment (KENALOG) 0.5 % APPLY TO AFFECTED AREA(S) THREE TIMES A DAY TOPICALLY 30 g 3   No current facility-administered medications for this visit.    Allergies-reviewed and updated Allergies  Allergen Reactions   Pollen Extract Cough, Itching and Other (See Comments)    Social History   Socioeconomic History   Marital status: Married    Spouse name: Not on file   Number of children: Not on file   Years of education: Not on file   Highest education level: Not on file  Occupational History   Not on file  Tobacco Use   Smoking status: Former    Years: 20.00    Types: Cigarettes    Quit date: 05/23/1994    Years since quitting: 28.2   Smokeless tobacco: Never  Vaping Use  Vaping Use: Never used  Substance and Sexual Activity   Alcohol use: Yes    Comment: occasionally   Drug use: Never   Sexual activity: Yes  Other Topics Concern   Not on file  Social History Narrative   Right handed   Lives in two story home with wife   Social Determinants of Health   Financial Resource Strain: Low Risk  (04/26/2022)   Overall Financial Resource Strain (CARDIA)    Difficulty of Paying Living Expenses: Not hard at all  Food Insecurity: No Food Insecurity (04/26/2022)   Hunger Vital Sign    Worried About Running Out of Food in the Last Year: Never true    Ran Out of Food in the Last Year: Never true  Transportation Needs: No Transportation Needs (04/26/2022)   PRAPARE - Administrator, Civil Service (Medical): No    Lack of Transportation (Non-Medical): No  Physical Activity: Sufficiently Active (04/26/2022)   Exercise Vital Sign    Days of Exercise per Week: 3 days    Minutes of Exercise per Session: 150+ min  Stress: No Stress Concern Present (04/26/2022)   [employer] of Occupational Health - Occupational Stress Questionnaire     Feeling of Stress : Not at all  Social Connections: Socially Integrated (04/26/2022)   Social Connection and Isolation Panel [NHANES]    Frequency of Communication with Friends and Family: More than three times a week    Frequency of Social Gatherings with Friends and Family: More than three times a week    Attends Religious Services: 1 to 4 times per year    Active Member of [employer] or Organizations: Yes    Attends Banker Meetings: 1 to 4 times per year    Marital Status: Married        Objective:  Physical Exam: BP 128/69   Pulse 94   Temp (!) 97.2 F (36.2 C) (Temporal)   Ht 5\' 10"  (1.778 m)   Wt 191 lb (86.6 kg)   SpO2 96%   BMI 27.41 kg/m   Body mass index is 27.41 kg/m. Wt Readings from Last 3 Encounters:  08/31/22 191 lb (86.6 kg)  08/19/22 186 lb (84.4 kg)  08/09/22 187 lb 12.8 oz (85.2 kg)   Gen: NAD, resting comfortably HEENT: TMs normal bilaterally. OP clear. No thyromegaly noted.  CV: RRR with no murmurs appreciated Pulm: NWOB, CTAB with no crackles, wheezes, or rhonchi GI: Normal bowel sounds present. Soft, Nontender, Nondistended. MSK: no edema, cyanosis, or clubbing noted Skin: warm, dry Neuro: CN2-12 grossly intact. Strength 5/5 in upper and lower extremities. Reflexes symmetric and intact bilaterally.  Psych: Normal affect and thought content     Jim Mathis M. 08/11/22, MD 08/31/2022 9:32 AM

## 2022-08-31 NOTE — Patient Instructions (Signed)
It was very nice to see you today!  We will give your flu shot today.  We will check blood work.  I will see back in year for your next annual physical.  Please come back sooner if needed.  Take care, Dr Jimmey Ralph  PLEASE NOTE:  If you had any lab tests please let us know if you have not heard back within a few days. You may see your results on mychart before we have a chance to review them but we will give you a call once they are reviewed by [place]. If we ordered any referrals today, please let us know if you have not heard from their office within the next week.   Please try these tips to maintain a healthy lifestyle:  Eat at least 3 REAL meals and 1-2 snacks per day.  Aim for no more than 5 hours between eating.  If you eat breakfast, please do so within one hour of getting up.   Each meal should contain half fruits/vegetables, one quarter protein, and one quarter carbs (no bigger than a computer mouse)  Cut down on sweet beverages. This includes juice, soda, and sweet tea.   Drink at least 1 glass of water with each meal and aim for at least 8 glasses per day  Exercise at least 150 minutes every week.    Preventive Care 90 Years and Older, Male Preventive care refers to lifestyle choices and visits with your health care provider that can promote health and wellness. Preventive care visits are also called wellness exams. What can I expect for my preventive care visit? Counseling During your preventive care visit, your health care provider may ask about your: Medical history, including: Past medical problems. Family medical history. History of falls. Current health, including: Emotional well-being. Home life and relationship well-being. Sexual activity. Memory and ability to understand (cognition). Lifestyle, including: Alcohol, nicotine or tobacco, and drug use. Access to firearms. Diet, exercise, and sleep habits. Work and work Astronomer. Sunscreen use. Safety issues  such as seatbelt and bike helmet use. Physical exam Your health care provider will check your: Height and weight. These may be used to calculate your BMI (body mass index). BMI is a measurement that tells if you are at a healthy weight. Waist circumference. This measures the distance around your waistline. This measurement also tells if you are at a healthy weight and may help predict your risk of certain diseases, such as type 2 diabetes and high blood pressure. Heart rate and blood pressure. Body temperature. Skin for abnormal spots. What immunizations do I need?  Vaccines are usually given at various ages, according to a schedule. Your health care provider will recommend vaccines for you based on your age, medical history, and lifestyle or other factors, such as travel or where you work. What tests do I need? Screening Your health care provider may recommend screening tests for certain conditions. This may include: Lipid and cholesterol levels. Diabetes screening. This is done by checking your blood sugar (glucose) after you have not eaten for a while (fasting). Hepatitis C test. Hepatitis B test. HIV (human immunodeficiency virus) test. STI (sexually transmitted infection) testing, if you are at risk. Lung cancer screening. Colorectal cancer screening. Prostate cancer screening. Abdominal aortic aneurysm (AAA) screening. You may need this if you are a current or former smoker. Talk with your health care provider about your test results, treatment options, and if necessary, the need for more tests. Follow these instructions at home: Eating and  drinking  Eat a diet that includes fresh fruits and vegetables, whole grains, lean protein, and low-fat dairy products. Limit your intake of foods with high amounts of sugar, saturated fats, and salt. Take vitamin and mineral supplements as recommended by your health care provider. Do not drink alcohol if your health care provider tells you not  to drink. If you drink alcohol: Limit how much you have to 0-2 drinks a day. Know how much alcohol is in your drink. In the [place] one drink equals one 12 oz bottle of beer (355 mL), one 5 oz glass of wine (148 mL), or one 1 oz glass of hard liquor (44 mL). Lifestyle Brush your teeth every morning and night with fluoride toothpaste. Floss one time each day. Exercise for at least 30 minutes 5 or more days each week. Do not use any products that contain nicotine or tobacco. These products include cigarettes, chewing tobacco, and vaping devices, such as e-cigarettes. If you need help quitting, ask your health care provider. Do not use drugs. If you are sexually active, practice safe sex. Use a condom or other form of protection to prevent STIs. Take aspirin only as told by your health care provider. Make sure that you understand how much to take and what form to take. Work with your health care provider to find out whether it is safe and beneficial for you to take aspirin daily. Ask your health care provider if you need to take a cholesterol-lowering medicine (statin). Find healthy ways to manage stress, such as: Meditation, yoga, or listening to music. Journaling. Talking to a trusted person. Spending time with friends and family. Safety Always wear your seat belt while driving or riding in a vehicle. Do not drive: If you have been drinking alcohol. Do not ride with someone who has been drinking. When you are tired or distracted. While texting. If you have been using any mind-altering substances or drugs. Wear a helmet and other protective equipment during sports activities. If you have firearms in your house, make sure you follow all gun safety procedures. Minimize exposure to UV radiation to reduce your risk of skin cancer. What's next? Visit your health care provider once a year for an annual wellness visit. Ask your health care provider how often you should have your eyes and teeth  checked. Stay up to date on all vaccines. This information is not intended to replace advice given to you by your health care provider. Make sure you discuss any questions you have with your health care provider. Document Revised: 06/03/2021 Document Reviewed: 06/03/2021 Elsevier Patient Education  2023 [employer].

## 2022-08-31 NOTE — Assessment & Plan Note (Signed)
Stable on doxepin 25 mg daily.

## 2022-08-31 NOTE — Assessment & Plan Note (Signed)
Follows with urology.  Symptoms are stable on Cialis 20 mg daily and finasteride 5 mg daily.

## 2022-08-31 NOTE — Assessment & Plan Note (Signed)
At goal on amlodipine 10 mg daily. 

## 2022-08-31 NOTE — Progress Notes (Signed)
Cervical spine MRI does show the potential for nerves to be pinched causing shoulder and arm pain however its not significantly different from the MRI in 2020.  I think a rheumatologic condition like polymyalgia rheumatica is still more likely.  We will talk about this more thoroughly during her follow-up visit with me on 19 September.  Have you scheduled with rheumatology yet?

## 2022-08-31 NOTE — Assessment & Plan Note (Signed)
Continue management per sports medicine.  Concern for possible PMR.  He had significant improvement with prednisone.  He will be following up with rheumatology later today.

## 2022-09-01 LAB — HEPATITIS C ANTIBODY: Hepatitis C Ab: NONREACTIVE

## 2022-09-02 ENCOUNTER — Encounter: Payer: PPO | Admitting: Physical Therapy

## 2022-09-02 ENCOUNTER — Encounter: Payer: Self-pay | Admitting: Physical Therapy

## 2022-09-03 DIAGNOSIS — H2512 Age-related nuclear cataract, left eye: Secondary | ICD-10-CM | POA: Diagnosis not present

## 2022-09-03 NOTE — Progress Notes (Signed)
Please inform patient of the following:  White blood cell count and one of his kidney numbers is elevated.  Please have him come back in 1 to 2 weeks to recheck CBC and BMET.   Blood sugars borderline elevated.  Do not need to start meds but he should continue to work on diet and exercise.  Everything else is normal.  We can recheck in a year.

## 2022-09-04 ENCOUNTER — Other Ambulatory Visit: Payer: PPO

## 2022-09-07 ENCOUNTER — Ambulatory Visit: Payer: PPO | Admitting: Family Medicine

## 2022-09-07 ENCOUNTER — Encounter: Payer: Self-pay | Admitting: Neurology

## 2022-09-07 VITALS — BP 144/82 | HR 76 | Wt 189.0 lb

## 2022-09-07 DIAGNOSIS — M353 Polymyalgia rheumatica: Secondary | ICD-10-CM | POA: Diagnosis not present

## 2022-09-07 DIAGNOSIS — R202 Paresthesia of skin: Secondary | ICD-10-CM | POA: Diagnosis not present

## 2022-09-07 NOTE — Patient Instructions (Signed)
Thank you for coming in today.   Proceed to the neve conduction study.   I will see you after I get the results back likely early November.   Please let me know if you have any problems.  I am very happy that we seem to be on the right track now.

## 2022-09-07 NOTE — Progress Notes (Signed)
I, [name], LAT, ATC acting as a scribe for [name], MD. . Jim Mathis. is a 71 y.o. male who presents to [facility] today for f/u worsening bilateral shoulder and shoulder girdle pain associated with bilateral upper extremity paresthesias or cervical radiculopathy. Pt was last seen by Dr. Denyse Amass on 08/19/22 and was prescribed gabapentin and a cervical MRI and a repeat NCV study were ordered. Additionally, labs were obtained, revealing a significantly elevated sedimentation rate indicating that polymyalgia rheumatica and was referred to rheumatology. Today, pt reports pain resolved w/ the prednisone. Pt just got scheduled for NCV study for 10/24. Pt has been seen by rheumatology and put him on prednisone.   Dx testing: 08/29/22 C-spine MRI  08/19/22 NCV study (ordered, but not obtained) 06/25/22 R & L hand XR, bilat hips and L shoulder XR             07/03/19 NCV study  Pertinent review of systems: No fevers or chills  Relevant historical information: Hypertension.  New diagnosis polymyalgia rheumatica. Probable carpal tunnel syndrome as well.  Exam:  BP (!) 144/82   Pulse 76   Wt 189 lb (85.7 kg)   SpO2 97%   BMI 27.12 kg/m  General: Well Developed, well nourished, and in no acute distress.   MSK: Normal upper extremity motion and strength.    Lab and Radiology Results  EXAM: MRI CERVICAL SPINE WITHOUT CONTRAST   TECHNIQUE: Multiplanar, multisequence MR imaging of the cervical spine was performed. No intravenous contrast was administered.   COMPARISON:  2020   FINDINGS: Alignment: Stable including mild degenerative listhesis.   Vertebrae: Degenerative endplate irregularity. No substantial marrow edema. No suspicious osseous lesion.   Cord: No abnormal signal.   Posterior Fossa, vertebral arteries, paraspinal tissues: Unremarkable.   Disc levels:   C2-C3: Uncovertebral hypertrophy. Left facet ankylosis. No  canal stenosis. Mild to moderate right and mild left foraminal stenosis.   C3-C4: Disc bulge with endplate osteophytes. Uncovertebral and facet hypertrophy. Mild canal stenosis. Marked foraminal stenosis.   C4-C5: Disc bulge with endplate osteophytes. Uncovertebral hypertrophy. Left facet hypertrophy. No canal stenosis. Moderate right and marked left foraminal stenosis.   C5-C6: Disc bulge with endplate osteophytes. Uncovertebral hypertrophy. No canal stenosis. Marked foraminal stenosis.   C6-C7: Disc bulge with endplate osteophytes. Uncovertebral hypertrophy. No canal stenosis. Moderate to marked foraminal stenosis.   C7-T1: Disc bulge with endplate osteophytes. No canal stenosis. Mild right and marked left foraminal stenosis.   IMPRESSION: Multilevel degenerative changes as detailed above. No significant canal stenosis. Multilevel significant foraminal narrowing likely similar to the 2020 examination.     Electronically Signed   By: Guadlupe Spanish M.D.   On: 08/30/2022 16:52   I, [name], personally (independently) visualized and performed the interpretation of the images attached in this note.      Assessment and Plan: 71 y.o. male with upper extremity weakness and pain thought to be due to to polymyalgia rheumatica based on elevated sedimentation rate and response to prednisone.  He is already been seen by rheumatology (Dr. Kathi Ludwig at [facility]) and is currently on longer duration prednisone.  He is responding to it quite well. His cervical spine MRI does show multiple neuroforaminal stenosis areas that could explain his symptoms but polymyalgia rheumatica is a much better explanation.  He also has distal paresthesias that are thought to be perhaps carpal tunnel syndrome and has a nerve conduction study scheduled for the end of  October which I think is reasonable to keep.  Recommend recheck with me once the report for the nerve conduction study is back  which we would expect in early November.  He has a follow-up appointment scheduled with rheumatology in about a month.  Check back with me if needed sooner.    Discussed warning signs or symptoms. Please see discharge instructions. Patient expresses understanding.   The above documentation has been reviewed and is accurate and complete Jim Mathis, M.D.

## 2022-09-08 ENCOUNTER — Telehealth: Payer: Self-pay | Admitting: Family Medicine

## 2022-09-08 ENCOUNTER — Other Ambulatory Visit: Payer: Self-pay | Admitting: *Deleted

## 2022-09-08 DIAGNOSIS — D729 Disorder of white blood cells, unspecified: Secondary | ICD-10-CM

## 2022-09-08 NOTE — Telephone Encounter (Signed)
Caller is Drema Pry from [employer] states: -Patient is set to have surgery on 10/02 but still needs surgical clearance form filled out by PCP - She initially faxed this on 09/15 but got no reply  I informed Drema Pry to re-fax form. I have received form via fax on 09/20 and it has been placed in provider's box for completion.

## 2022-09-09 ENCOUNTER — Encounter: Payer: PPO | Admitting: Physical Therapy

## 2022-09-09 NOTE — Telephone Encounter (Signed)
Please advise 

## 2022-09-10 NOTE — Telephone Encounter (Signed)
Form completed.  Algis Greenhouse. Jerline Pain, MD 09/10/2022 2:13 PM

## 2022-09-14 ENCOUNTER — Encounter: Payer: PPO | Admitting: Physical Therapy

## 2022-09-16 ENCOUNTER — Encounter: Payer: PPO | Admitting: Physical Therapy

## 2022-09-20 ENCOUNTER — Other Ambulatory Visit: Payer: Self-pay | Admitting: Family Medicine

## 2022-09-22 ENCOUNTER — Other Ambulatory Visit (INDEPENDENT_AMBULATORY_CARE_PROVIDER_SITE_OTHER): Payer: PPO

## 2022-09-22 DIAGNOSIS — D729 Disorder of white blood cells, unspecified: Secondary | ICD-10-CM | POA: Diagnosis not present

## 2022-09-22 LAB — COMPREHENSIVE METABOLIC PANEL
ALT: 15 U/L (ref 0–53)
AST: 13 U/L (ref 0–37)
Albumin: 3.9 g/dL (ref 3.5–5.2)
Alkaline Phosphatase: 60 U/L (ref 39–117)
BUN: 22 mg/dL (ref 6–23)
CO2: 25 mEq/L (ref 19–32)
Calcium: 9 mg/dL (ref 8.4–10.5)
Chloride: 104 mEq/L (ref 96–112)
Creatinine, Ser: 0.88 mg/dL (ref 0.40–1.50)
GFR: 86.21 mL/min (ref >60.00)
Glucose, Bld: 125 mg/dL — ABNORMAL HIGH (ref 70–99)
Potassium: 4.2 mEq/L (ref 3.5–5.1)
Sodium: 136 mEq/L (ref 135–145)
Total Bilirubin: 0.4 mg/dL (ref 0.2–1.2)
Total Protein: 6.3 g/dL (ref 6.0–8.3)

## 2022-09-22 LAB — CBC
HCT: 42.6 % (ref 39.0–52.0)
Hemoglobin: 13.9 g/dL (ref 13.0–17.0)
MCHC: 32.6 g/dL (ref 30.0–36.0)
MCV: 92.2 fl (ref 78.0–100.0)
Platelets: 213 10*3/uL (ref 150.0–400.0)
RBC: 4.62 Mil/uL (ref 4.22–5.81)
RDW: 16.9 % — ABNORMAL HIGH (ref 11.5–15.5)
WBC: 10.3 10*3/uL (ref 4.0–10.5)

## 2022-09-24 NOTE — Progress Notes (Signed)
Please inform patient of the following:  Labs are back to normal.  Do not need to do any further testing at this point.  We can recheck again in a year.

## 2022-09-27 ENCOUNTER — Other Ambulatory Visit: Payer: Self-pay | Admitting: Family Medicine

## 2022-09-27 DIAGNOSIS — H2512 Age-related nuclear cataract, left eye: Secondary | ICD-10-CM | POA: Diagnosis not present

## 2022-09-27 DIAGNOSIS — H269 Unspecified cataract: Secondary | ICD-10-CM | POA: Diagnosis not present

## 2022-10-11 DIAGNOSIS — H269 Unspecified cataract: Secondary | ICD-10-CM | POA: Diagnosis not present

## 2022-10-11 DIAGNOSIS — H2511 Age-related nuclear cataract, right eye: Secondary | ICD-10-CM | POA: Diagnosis not present

## 2022-10-12 ENCOUNTER — Ambulatory Visit: Payer: PPO | Admitting: Neurology

## 2022-10-12 DIAGNOSIS — M5412 Radiculopathy, cervical region: Secondary | ICD-10-CM

## 2022-10-12 DIAGNOSIS — G5603 Carpal tunnel syndrome, bilateral upper limbs: Secondary | ICD-10-CM

## 2022-10-13 NOTE — Procedures (Signed)
[facility]  [address] [place], [address]  [place], [place] [address] Tel: [phone] Fax: [phone] Test Date:  10/12/2022  Patient: Jim Mathis, Jim Mathis. DOB: Mar 23, 1951 Physician: Nita Sickle, DO  Sex: Male Height: 5\' 10"  Ref Phys: , DO  ID#: [id number]   Technician:    History: This is a 71 year old man referred for evaluation of bilateral arm paresthesias, pain, and weakness.  NCV & EMG Findings: Extensive electrodiagnostic testing of the right upper extremity and additional studies of the left shows:  Bilateral mixed palmar sensory responses show prolonged latency.  Bilateral median and right ulnar sensory responses are within normal limits.  Left ulnar sensory response is asymmetrically reduced, compared to the right. Median and right ulnar motor responses are within normal limits.  Ulnar motor response shows slowed conduction velocity across the elbow (A Elbow-B Elbow, 43 m/s).   Chronic motor axonal loss changes are seen affecting the C5-C8 myotomes, without accompanied active denervation.  Impression: Chronic multilevel radiculopathies affecting bilateral C5, C6, C7, and C8 nerve root/segments, moderate. Left ulnar neuropathy with slowing across the elbow, mild. Bilateral median neuropathy at or distal to the wrist, consistent with a clinical diagnosis of carpal tunnel syndrome.  Overall, these findings are very mild in degree electrically.   ___________________________ 60, DO    Nerve Conduction Studies   Stim Site NR Peak (ms) Norm Peak (ms) O-P Amp (V) Norm O-P Amp  Left Median Anti Sensory (2nd Digit)  32 C  Wrist    3.3 <3.8 17.3 >10  Right Median Anti Sensory (2nd Digit)  32 C  Wrist    3.2 <3.8 21.5 >10  Left Ulnar Anti Sensory (5th Digit)  32 C  Wrist    2.5 <3.2 12.6 >5  Right Ulnar Anti Sensory (5th Digit)  32 C  Wrist    2.6 <3.2 27.9 >5     Stim Site NR Onset (ms) Norm Onset (ms) O-P Amp (mV) Norm O-P Amp  Site1 Site2 Delta-0 (ms) Dist (cm) Vel (m/s) Norm Vel (m/s)  Left Median Motor (Abd Poll Brev)  32 C  Wrist    3.3 <4.0 8.5 >5 Elbow Wrist 5.5 29.0 53 >50  Elbow    8.8  7.9         Right Median Motor (Abd Poll Brev)  32 C  Wrist    3.0 <4.0 9.3 >5 Elbow Wrist 6.1 31.0 51 >50  Elbow    9.1  8.4         Left Ulnar Motor (Abd Dig Minimi)  32 C  Wrist    2.3 <3.1 9.0 >7 B Elbow Wrist 3.4 21.0 62 >50  B Elbow    5.7  8.2  A Elbow B Elbow 2.3 10.0 *43 >50  A Elbow    8.0  7.7         Right Ulnar Motor (Abd Dig Minimi)  32 C  Wrist    2.5 <3.1 9.9 >7 B Elbow Wrist 3.2 21.0 66 >50  B Elbow    5.7  8.8  A Elbow B Elbow 1.8 10.0 56 >50  A Elbow    7.5  8.7            Stim Site NR Peak (ms) Norm Peak (ms) P-T Amp (V) Site1 Site2 Delta-P (ms) Norm Delta (ms)  Left Median/Ulnar Palm Comparison (Wrist - 8cm)  32 C  Median Palm    *2.3 <2.2 29.5 Median Palm Ulnar Palm *0.8  Ulnar Palm    1.5 <2.2 6.0      Right Median/Ulnar Palm Comparison (Wrist - 8cm)  32 C  Median Palm    2.0 <2.2 21.7 Median Palm Ulnar Palm *0.8   Ulnar Palm    1.2 <2.2 11.3       Electromyography   Side Muscle Ins.Act Fibs Fasc Recrt Amp Dur Poly Activation Comment  Right 1stDorInt Nml Nml Nml *1- *1+ *1+ *1+ Nml N/A  Right Abd Poll Brev Nml Nml Nml Nml Nml Nml Nml Nml N/A  Right PronatorTeres Nml Nml Nml *1- *1+ *1+ *1+ Nml N/A  Right Biceps Nml Nml Nml *1- *1+ *1+ *1+ Nml N/A  Right Triceps Nml Nml Nml Nml Nml Nml Nml Nml N/A  Right Deltoid Nml Nml Nml *1- *1+ *1+ *1+ Nml N/A  Right Ext Indicis Nml Nml Nml *1- *1+ *1+ *1+ Nml N/A  Left 1stDorInt Nml Nml Nml *2- *1+ *1+ *1+ Nml N/A  Left Abd Poll Brev Nml Nml Nml Nml Nml Nml Nml Nml N/A  Left PronatorTeres Nml Nml Nml *1- *1+ *1+ *1+ Nml N/A  Left Biceps Nml Nml Nml *1- *1+ *1+ *1+ Nml N/A  Left Triceps Nml Nml Nml *1- *1+ *1+ *1+ Nml N/A  Left Deltoid Nml Nml Nml *1- *1+ *1+ *1+ Nml N/A  Left FlexCarpiUln Nml Nml Nml *1- *1+ *1+ *1+ Nml N/A  Left Ext  Indicis Nml Nml Nml *1- *1+ *1+ *1+ Nml N/A      Waveforms:

## 2022-10-14 ENCOUNTER — Encounter: Payer: Self-pay | Admitting: Family Medicine

## 2022-10-14 DIAGNOSIS — R3912 Poor urinary stream: Secondary | ICD-10-CM | POA: Diagnosis not present

## 2022-10-14 DIAGNOSIS — N5201 Erectile dysfunction due to arterial insufficiency: Secondary | ICD-10-CM | POA: Diagnosis not present

## 2022-10-14 NOTE — Progress Notes (Signed)
Nerve conduction study shows multiple mild to medium pinched nerves.  Some nerves are pinched in your neck and you have a few nerves pinched in your arms that could cause some numbness or tingling or even potentially weakness at times. Recommend return to clinic to go over the results in full detail.

## 2022-10-14 NOTE — Progress Notes (Signed)
Patient seen results through mychart

## 2022-10-18 ENCOUNTER — Ambulatory Visit: Payer: PPO | Admitting: Family Medicine

## 2022-10-18 VITALS — BP 172/84 | HR 83 | Ht 70.0 in | Wt 193.0 lb

## 2022-10-18 DIAGNOSIS — M5412 Radiculopathy, cervical region: Secondary | ICD-10-CM

## 2022-10-18 DIAGNOSIS — R202 Paresthesia of skin: Secondary | ICD-10-CM

## 2022-10-18 DIAGNOSIS — M353 Polymyalgia rheumatica: Secondary | ICD-10-CM

## 2022-10-18 DIAGNOSIS — G5602 Carpal tunnel syndrome, left upper limb: Secondary | ICD-10-CM

## 2022-10-18 NOTE — Patient Instructions (Addendum)
Thank you for coming in today.   Pay attention to the nerve pain.  Resume night splint for carpal tunnel and possible pinched nerve in the neck as the prednisone goes away.   If worse let me know and we can do injections in your wrist or in your neck.   Let me know.

## 2022-10-18 NOTE — Progress Notes (Signed)
I, Peterson Lombard, LAT, ATC acting as a scribe for Lynne Leader, MD.  Jim Kok. is a 71 y.o. male who presents to [place] at [facility] today for UE weakness and pain thought to be due to to polymyalgia rheumatica.  Patient was last seen by Dr. Georgina Snell on 09/07/2022 he was advised to continue with follow-up visit with rheumatology and check back after NCV study.  Today, patient reports he hasn't yet had the f/u yet w/ rheumatology. Pt is wondering if he can decrease the prednisone dosage. Pt feels some improvement in the weakness in his UE.   Dx testing: 10/12/22 NCV study 08/29/22 C-spine MRI             08/19/22 Labs 06/25/22 R & L hand XR, bilat hips and L shoulder XR             07/03/19 NCV study  Pertinent review of systems: No fevers or chills  Relevant historical information: Polymyalgia rheumatica   Exam:  BP (!) 172/84   Pulse 83   Ht 5\' 10"  (1.778 m)   Wt 193 lb (87.5 kg)   SpO2 97%   BMI 27.69 kg/m  General: Well Developed, well nourished, and in no acute distress.   MSK: Upper extremity strength is intact    Lab and Radiology Results   EMG/NCV 10/12/22   NCV & EMG Findings: Extensive electrodiagnostic testing of the right upper extremity and additional studies of the left shows:  Bilateral mixed palmar sensory responses show prolonged latency.  Bilateral median and right ulnar sensory responses are within normal limits.  Left ulnar sensory response is asymmetrically reduced, compared to the right. Median and right ulnar motor responses are within normal limits.  Ulnar motor response shows slowed conduction velocity across the elbow (A Elbow-B Elbow, 43 m/s).   Chronic motor axonal loss changes are seen affecting the C5-C8 myotomes, without accompanied active denervation.   Impression: Chronic multilevel radiculopathies affecting bilateral C5, C6, C7, and C8 nerve root/segments, moderate. Left ulnar neuropathy with slowing across the  elbow, mild. Bilateral median neuropathy at or distal to the wrist, consistent with a clinical diagnosis of carpal tunnel syndrome.  Overall, these findings are very mild in degree electrically.     ___________________________ Narda Amber, DO  EXAM: MRI CERVICAL SPINE WITHOUT CONTRAST   TECHNIQUE: Multiplanar, multisequence MR imaging of the cervical spine was performed. No intravenous contrast was administered.   COMPARISON:  2020   FINDINGS: Alignment: Stable including mild degenerative listhesis.   Vertebrae: Degenerative endplate irregularity. No substantial marrow edema. No suspicious osseous lesion.   Cord: No abnormal signal.   Posterior Fossa, vertebral arteries, paraspinal tissues: Unremarkable.   Disc levels:   C2-C3: Uncovertebral hypertrophy. Left facet ankylosis. No canal stenosis. Mild to moderate right and mild left foraminal stenosis.   C3-C4: Disc bulge with endplate osteophytes. Uncovertebral and facet hypertrophy. Mild canal stenosis. Marked foraminal stenosis.   C4-C5: Disc bulge with endplate osteophytes. Uncovertebral hypertrophy. Left facet hypertrophy. No canal stenosis. Moderate right and marked left foraminal stenosis.   C5-C6: Disc bulge with endplate osteophytes. Uncovertebral hypertrophy. No canal stenosis. Marked foraminal stenosis.   C6-C7: Disc bulge with endplate osteophytes. Uncovertebral hypertrophy. No canal stenosis. Moderate to marked foraminal stenosis.   C7-T1: Disc bulge with endplate osteophytes. No canal stenosis. Mild right and marked left foraminal stenosis.   IMPRESSION: Multilevel degenerative changes as detailed above. No significant canal stenosis. Multilevel significant foraminal narrowing likely similar to the  2020 examination.     Electronically Signed   By: Macy Mis M.D.   On: 08/30/2022 16:52 I, Lynne Leader, personally (independently) visualized and performed the interpretation of the images attached  in this note.   Assessment and Plan: 71 y.o. male with polymyalgia rheumatica currently controlled with prednisone.  He will see his rheumatologist in about 2 weeks and expect he will start a prednisone downward taper at that point.  He has upper extremity paresthesias currently well controlled with the prednisone that may be uncovered as the prednisone dose decreases.  Symptoms are consistent with both cervical radiculopathy and carpal tunnel syndrome.  Recommend night splints and potential injection into the carpal tunnel or cervical spine if needed.  He also has some neck pain that we could address as well as facet injections if needed.  Reviewed the above diagnosis and plan.  He expresses understanding and agreement. Total encounter time 30 minutes including face-to-face time with the patient and, reviewing past medical record, and charting on the date of service.       Discussed warning signs or symptoms. Please see discharge instructions. Patient expresses understanding.   The above documentation has been reviewed and is accurate and complete Lynne Leader, M.D.

## 2022-10-21 ENCOUNTER — Telehealth: Payer: Self-pay | Admitting: Family Medicine

## 2022-10-21 ENCOUNTER — Other Ambulatory Visit: Payer: Self-pay | Admitting: *Deleted

## 2022-10-21 MED ORDER — DOXEPIN HCL 25 MG PO CAPS: 25.0000 mg | ORAL_CAPSULE | Freq: Every day | ORAL | 1 refills | Status: DC

## 2022-10-21 NOTE — Telephone Encounter (Signed)
Rx Sinequan was send to [facility]

## 2022-10-21 NOTE — Telephone Encounter (Signed)
..   Encourage patient to contact the pharmacy for refills or they can request refills through [place]:  08/31/22  NEXT APPOINTMENT DATE: 05/09/23   MEDICATION: doxepin (SINEQUAN) 25 MG capsule   Is the patient out of medication? Has 4 pills left  PHARMACY: [facility] ([place], [address] [place], [place] [address] Phone: [phone]  Fax: [phone]

## 2022-10-27 ENCOUNTER — Other Ambulatory Visit: Payer: Self-pay | Admitting: *Deleted

## 2022-11-03 DIAGNOSIS — Z1159 Encounter for screening for other viral diseases: Secondary | ICD-10-CM | POA: Diagnosis not present

## 2022-11-03 DIAGNOSIS — M419 Scoliosis, unspecified: Secondary | ICD-10-CM | POA: Diagnosis not present

## 2022-11-03 DIAGNOSIS — M25512 Pain in left shoulder: Secondary | ICD-10-CM | POA: Diagnosis not present

## 2022-11-03 DIAGNOSIS — M353 Polymyalgia rheumatica: Secondary | ICD-10-CM | POA: Diagnosis not present

## 2022-11-03 DIAGNOSIS — M0609 Rheumatoid arthritis without rheumatoid factor, multiple sites: Secondary | ICD-10-CM | POA: Diagnosis not present

## 2022-11-03 DIAGNOSIS — M79641 Pain in right hand: Secondary | ICD-10-CM | POA: Diagnosis not present

## 2022-11-03 DIAGNOSIS — M549 Dorsalgia, unspecified: Secondary | ICD-10-CM | POA: Diagnosis not present

## 2022-11-03 DIAGNOSIS — M25511 Pain in right shoulder: Secondary | ICD-10-CM | POA: Diagnosis not present

## 2022-11-03 DIAGNOSIS — M79642 Pain in left hand: Secondary | ICD-10-CM | POA: Diagnosis not present

## 2022-11-03 DIAGNOSIS — M7552 Bursitis of left shoulder: Secondary | ICD-10-CM | POA: Diagnosis not present

## 2022-12-13 ENCOUNTER — Encounter: Payer: Self-pay | Admitting: Family Medicine

## 2022-12-13 DIAGNOSIS — M5412 Radiculopathy, cervical region: Secondary | ICD-10-CM

## 2022-12-17 ENCOUNTER — Ambulatory Visit
Admission: RE | Admit: 2022-12-17 | Discharge: 2022-12-17 | Disposition: A | Discharge status: Home / Self Care | Payer: PPO | Source: Ambulatory Visit | Attending: Family Medicine | Admitting: Family Medicine

## 2022-12-17 DIAGNOSIS — M4722 Other spondylosis with radiculopathy, cervical region: Secondary | ICD-10-CM | POA: Diagnosis not present

## 2022-12-17 DIAGNOSIS — M5412 Radiculopathy, cervical region: Secondary | ICD-10-CM

## 2022-12-17 MED ORDER — TRIAMCINOLONE ACETONIDE 40 MG/ML IJ SUSP (RADIOLOGY)
60.0000 mg | Freq: Once | INTRAMUSCULAR | Status: AC
  Administered 2022-12-17: 60 mg via EPIDURAL

## 2022-12-17 MED ORDER — IOPAMIDOL (ISOVUE-M 300) INJECTION 61%
1.0000 mL | Freq: Once | INTRAMUSCULAR | Status: AC | PRN
  Administered 2022-12-17: 1 mL via EPIDURAL

## 2022-12-17 NOTE — Discharge Instructions (Signed)

## 2022-12-27 DIAGNOSIS — M7552 Bursitis of left shoulder: Secondary | ICD-10-CM | POA: Diagnosis not present

## 2022-12-27 DIAGNOSIS — M542 Cervicalgia: Secondary | ICD-10-CM | POA: Diagnosis not present

## 2022-12-27 DIAGNOSIS — M0609 Rheumatoid arthritis without rheumatoid factor, multiple sites: Secondary | ICD-10-CM | POA: Diagnosis not present

## 2022-12-27 DIAGNOSIS — M353 Polymyalgia rheumatica: Secondary | ICD-10-CM | POA: Diagnosis not present

## 2022-12-27 DIAGNOSIS — M549 Dorsalgia, unspecified: Secondary | ICD-10-CM | POA: Diagnosis not present

## 2022-12-27 DIAGNOSIS — M419 Scoliosis, unspecified: Secondary | ICD-10-CM | POA: Diagnosis not present

## 2022-12-28 ENCOUNTER — Ambulatory Visit: Payer: PPO | Admitting: Family Medicine

## 2022-12-28 VITALS — BP 172/80 | HR 97 | Ht 70.0 in | Wt 193.0 lb

## 2022-12-28 DIAGNOSIS — M5412 Radiculopathy, cervical region: Secondary | ICD-10-CM

## 2022-12-28 MED ORDER — PREGABALIN 25 MG PO CAPS: 25.0000 mg | ORAL_CAPSULE | Freq: Every evening | ORAL | 1 refills | Status: DC | PRN

## 2022-12-28 NOTE — Patient Instructions (Signed)
Thank you for coming in today.   Please call [facility] at [phone] to schedule your spine injection.    Get a 2nd carpal tunnel brace.   Try lyrica at bedtime as needed.   Keep me updated.   If not better we may ask a neurosurgereon or a orthopedic spine surgeon for help.

## 2022-12-28 NOTE — Progress Notes (Signed)
I, [name], LAT, ATC acting as a scribe for [name], MD.  Jim Mathis. is a 72 y.o. male who presents to [facility] today for cont'd UE weakness and pain thought to be due to to polymyalgia rheumatica, CTS, and cervical radiculitis.  Patient was last seen by Dr. Denyse Amass on 10/18/22 and was advised to cont to his visit w/ rheumatology and to wear wrist night splints. Pt sent a MyChart message on 12/25 c/o increase pain and radicular symptoms and a cervical ESI was ordered, and later performed on 12/29. Today, pt reports he initially had great benefit from [facility], but pain and paresthesia has returned.   Dx testing: 10/12/22 NCV study 08/29/22 C-spine MRI             08/19/22 Labs 06/25/22 R & L hand XR, bilat hips and L shoulder XR             07/03/19 NCV study  Pertinent review of systems: No fevers or chills  Relevant historical information: Cervical radiculopathy and carpal tunnel syndrome on nerve conduction study.  Polymyalgia rheumatica now improving weaning off of her weaned off of prednisone.   Exam:  BP (!) 172/80   Pulse 97   Ht 5\' 10"  (1.778 m)   Wt 193 lb (87.5 kg)   SpO2 96%   BMI 27.69 kg/m  General: Well Developed, well nourished, and in no acute distress.   MSK: C-spine: Normal. Nontender spinal midline.  Decreased cervical motion.  Upper extremity strength is intact. Reflexes are intact. Right wrist normal appearing nontender to palpation.  Negative Tinel's. Grip strength is intact   Lab and Radiology Results   EXAM: MRI CERVICAL SPINE WITHOUT CONTRAST   TECHNIQUE: Multiplanar, multisequence MR imaging of the cervical spine was performed. No intravenous contrast was administered.   COMPARISON:  2020   FINDINGS: Alignment: Stable including mild degenerative listhesis.   Vertebrae: Degenerative endplate irregularity. No substantial marrow edema. No suspicious osseous lesion.   Cord: No abnormal signal.    Posterior Fossa, vertebral arteries, paraspinal tissues: Unremarkable.   Disc levels:   C2-C3: Uncovertebral hypertrophy. Left facet ankylosis. No canal stenosis. Mild to moderate right and mild left foraminal stenosis.   C3-C4: Disc bulge with endplate osteophytes. Uncovertebral and facet hypertrophy. Mild canal stenosis. Marked foraminal stenosis.   C4-C5: Disc bulge with endplate osteophytes. Uncovertebral hypertrophy. Left facet hypertrophy. No canal stenosis. Moderate right and marked left foraminal stenosis.   C5-C6: Disc bulge with endplate osteophytes. Uncovertebral hypertrophy. No canal stenosis. Marked foraminal stenosis.   C6-C7: Disc bulge with endplate osteophytes. Uncovertebral hypertrophy. No canal stenosis. Moderate to marked foraminal stenosis.   C7-T1: Disc bulge with endplate osteophytes. No canal stenosis. Mild right and marked left foraminal stenosis.   IMPRESSION: Multilevel degenerative changes as detailed above. No significant canal stenosis. Multilevel significant foraminal narrowing likely similar to the 2020 examination.     Electronically Signed   By: 2021 M.D.   On: 08/30/2022 16:52 I, 10/30/2022, personally (independently) visualized and performed the interpretation of the images attached in this note.   EMG/NCV 10/12/22 Impression: Chronic multilevel radiculopathies affecting bilateral C5, C6, C7, and C8 nerve root/segments, moderate. Left ulnar neuropathy with slowing across the elbow, mild. Bilateral median neuropathy at or distal to the wrist, consistent with a clinical diagnosis of carpal tunnel syndrome.  Overall, these findings are very mild in degree electrically.        Assessment and Plan:  72 y.o. male with right arm pain thought to be cervical radiculopathy likely C7.  However he has cervical radiculopathy at almost every nerve root extending into the arm and also carpal tunnel syndrome on the right that could  confound this diagnosis.  He responded very well to an epidural steroid injection in December.  Plan for repeat epidural steroid injection.  This does not last may benefit from a surgical opinion with neurosurgery and a trial of a carpal tunnel injection.  Will try Lyrica as he did not tolerate gabapentin very well.   PDMP reviewed during this encounter. Orders Placed This Encounter  Procedures   DG INJECT DIAG/THERA/INC NEEDLE/CATH/PLC EPI/LUMB/SAC W/IMG    Standing Status:   Future    Standing Expiration Date:   12/29/2023    Order Specific Question:   Reason for Exam (SYMPTOM  OR DIAGNOSIS REQUIRED)    Answer:   Repeat Cervial ESI. Rt C7 symptoms. level and technique per radiology    Order Specific Question:   Preferred Imaging Location?    Answer:   [id number]    Order Specific Question:   Radiology Contrast Protocol - do NOT remove file path    Answer:   [email or web address]   Meds ordered this encounter  Medications   pregabalin (LYRICA) 25 MG capsule    Sig: Take 1-3 capsules (25-75 mg total) by mouth at bedtime as needed (nerve pain).    Dispense:  60 capsule    Refill:  1     Discussed warning signs or symptoms. Please see discharge instructions. Patient expresses understanding.   The above documentation has been reviewed and is accurate and complete Lynne Leader, M.D.

## 2022-12-31 ENCOUNTER — Other Ambulatory Visit: Payer: Self-pay | Admitting: Family Medicine

## 2023-01-07 ENCOUNTER — Ambulatory Visit
Admission: RE | Admit: 2023-01-07 | Discharge: 2023-01-07 | Disposition: A | Discharge status: Home / Self Care | Payer: PPO | Source: Ambulatory Visit | Attending: Family Medicine | Admitting: Family Medicine

## 2023-01-07 DIAGNOSIS — M4722 Other spondylosis with radiculopathy, cervical region: Secondary | ICD-10-CM | POA: Diagnosis not present

## 2023-01-07 DIAGNOSIS — M5412 Radiculopathy, cervical region: Secondary | ICD-10-CM

## 2023-01-07 MED ORDER — TRIAMCINOLONE ACETONIDE 40 MG/ML IJ SUSP (RADIOLOGY)
60.0000 mg | Freq: Once | INTRAMUSCULAR | Status: AC
  Administered 2023-01-07: 60 mg via EPIDURAL

## 2023-01-07 MED ORDER — IOPAMIDOL (ISOVUE-M 300) INJECTION 61%
1.0000 mL | Freq: Once | INTRAMUSCULAR | Status: AC | PRN
  Administered 2023-01-07: 1 mL via EPIDURAL

## 2023-01-07 NOTE — Discharge Instructions (Signed)

## 2023-01-09 ENCOUNTER — Other Ambulatory Visit: Payer: Self-pay | Admitting: Family Medicine

## 2023-01-31 ENCOUNTER — Ambulatory Visit: Payer: PPO | Admitting: Family Medicine

## 2023-01-31 ENCOUNTER — Ambulatory Visit: Payer: Self-pay

## 2023-01-31 VITALS — BP 148/76 | HR 93 | Ht 70.0 in | Wt 196.0 lb

## 2023-01-31 DIAGNOSIS — M353 Polymyalgia rheumatica: Secondary | ICD-10-CM | POA: Diagnosis not present

## 2023-01-31 DIAGNOSIS — G5601 Carpal tunnel syndrome, right upper limb: Secondary | ICD-10-CM

## 2023-01-31 DIAGNOSIS — M5412 Radiculopathy, cervical region: Secondary | ICD-10-CM

## 2023-01-31 DIAGNOSIS — M25531 Pain in right wrist: Secondary | ICD-10-CM

## 2023-01-31 NOTE — Progress Notes (Signed)
I, Peterson Lombard, LAT, ATC acting as a scribe for Lynne Leader, MD.  Jim Mathis. is a 72 y.o. male who presents to [place] at [facility] today for cont'd bilat CTS, R>L. Pt was last seen by Dr. Georgina Snell on 12/28/22 for cervical radiculitis and a repeat cervical ESI was ordered (performed on 1/19) and was prescribed Lyrica. Pt's last L CTS steroid injection was on 08/09/22. Today, pt reports the last ESI has lasted longer. Pt is weaning off the prednisone, down to 74m dosage, and is having more polymyalgia. Pt feels "woozy" from the Lyrica. Pt has an area of tenderness on the ulnar aspect of the R wrist/hand.  R hand swelling: yes- esp R Paresthesia: yes- whole had, esp the R 4th Grip strength: weakened- unable to make a fist, open a tube of toothpaste  Dx testing: 10/12/22 NCV study 08/29/22 C-spine MRI             08/19/22 Labs 06/25/22 R & L hand XR, bilat hips and L shoulder XR             07/03/19 NCV study   He has a recent history of polymyalgia rheumatica that was very well treated with prednisone.  His symptoms have been returning as his prednisone has been weaned off.  He is scheduled to see his rheumatologist [facility].  Pertinent review of systems: No fevers or chills.  Positive for worsening overall myalgias and arthralgias and hand swelling.  Relevant historical information: Polymyalgia rheumatica.   Exam:  BP (!) 148/76   Pulse 93   Ht 5' 10"$  (1.778 m)   Wt 196 lb (88.9 kg)   BMI 28.12 kg/m  General: Well Developed, well nourished, and in no acute distress.   MSK: Right hand and wrist significant swelling bilateral upper extremities. Right wrist tender palpation with positive Tinel's at carpal tunnel.  Normal wrist motion. Pulses capillary fill and sensation are intact distally.    Lab and Radiology Results  Procedure: Real-time Ultrasound Guided median nerve hydrodissection at right carpal  tunnel Device: Philips Affiniti 50G Images permanently stored and available for review in PACS Verbal informed consent obtained.  Discussed risks and benefits of procedure. Warned about infection, bleeding, hyperglycemia damage to structures among others. Patient expresses understanding and agreement Time-out conducted.   Noted no overlying erythema, induration, or other signs of local infection.   Skin prepped in a sterile fashion.   Local anesthesia: Topical Ethyl chloride.   With sterile technique and under real time ultrasound guidance: 40 mg of Kenalog and 1 mL of lidocaine injected into carpal tunnel around median nerve. Fluid seen entering the carpal tunnel.   Completed without difficulty   Pain immediately resolved suggesting accurate placement of the medication.   Advised to call if fevers/chills, erythema, induration, drainage, or persistent bleeding.   Images permanently stored and available for review in the ultrasound unit.  Impression: Technically successful ultrasound guided injection.         Assessment and Plan: 72y.o. male with right carpal tunnel syndrome in the setting of cervical radiculopathy and polymyalgia rheumatica.  Plan for carpal tunnel injection. If this does not work well enough would consider repeat cervical epidural steroid injection.  However I think his biggest issue is that his polymyalgia rheumatica is returning as his prednisone is being tapered.  He is scheduled to see his rheumatologist on March 7.  I advised him to contact his rheumatologist sooner  and let her know that he is not doing well and consider increasing prednisone again.  I suspect with higher doses of oral prednisone a lot of these problems are getting get better.   PDMP not reviewed this encounter. Orders Placed This Encounter  Procedures   [place] LIMITED JOINT SPACE STRUCTURES UP RIGHT(NO LINKED CHARGES)    Order Specific Question:   Reason for Exam (SYMPTOM  OR DIAGNOSIS REQUIRED)     Answer:   right wrist pain    Order Specific Question:   Preferred imaging location?    Answer:   [place]   No orders of the defined types were placed in this encounter.    Discussed warning signs or symptoms. Please see discharge instructions. Patient expresses understanding.   The above documentation has been reviewed and is accurate and complete Lynne Leader, M.D.

## 2023-01-31 NOTE — Patient Instructions (Addendum)
Thank you for coming in today.   Reach out to your rheumatologist and ask about increasing the prednisone dosage.  Try wearing compression gloves.  Carpal tunnel wrist brace at night.  You received an injection today. Seek immediate medical attention if the joint becomes red, extremely painful, or is oozing fluid.

## 2023-02-19 ENCOUNTER — Other Ambulatory Visit: Payer: Self-pay | Admitting: Family Medicine

## 2023-02-24 DIAGNOSIS — M542 Cervicalgia: Secondary | ICD-10-CM | POA: Diagnosis not present

## 2023-02-24 DIAGNOSIS — M549 Dorsalgia, unspecified: Secondary | ICD-10-CM | POA: Diagnosis not present

## 2023-02-24 DIAGNOSIS — M7552 Bursitis of left shoulder: Secondary | ICD-10-CM | POA: Diagnosis not present

## 2023-02-24 DIAGNOSIS — M0609 Rheumatoid arthritis without rheumatoid factor, multiple sites: Secondary | ICD-10-CM | POA: Diagnosis not present

## 2023-02-24 DIAGNOSIS — M419 Scoliosis, unspecified: Secondary | ICD-10-CM | POA: Diagnosis not present

## 2023-02-24 DIAGNOSIS — M353 Polymyalgia rheumatica: Secondary | ICD-10-CM | POA: Diagnosis not present

## 2023-03-21 ENCOUNTER — Encounter: Payer: Self-pay | Admitting: Neurology

## 2023-03-23 ENCOUNTER — Encounter: Payer: Self-pay | Admitting: Family Medicine

## 2023-03-23 ENCOUNTER — Ambulatory Visit (INDEPENDENT_AMBULATORY_CARE_PROVIDER_SITE_OTHER): Payer: PPO | Admitting: Family Medicine

## 2023-03-23 VITALS — BP 123/72 | HR 92 | Temp 98.3°F | Ht 70.0 in | Wt 192.6 lb

## 2023-03-23 DIAGNOSIS — I10 Essential (primary) hypertension: Secondary | ICD-10-CM

## 2023-03-23 DIAGNOSIS — M792 Neuralgia and neuritis, unspecified: Secondary | ICD-10-CM | POA: Diagnosis not present

## 2023-03-23 DIAGNOSIS — Z1211 Encounter for screening for malignant neoplasm of colon: Secondary | ICD-10-CM | POA: Diagnosis not present

## 2023-03-23 DIAGNOSIS — M353 Polymyalgia rheumatica: Secondary | ICD-10-CM

## 2023-03-23 NOTE — Patient Instructions (Signed)
It was very nice to see you today!  I will refer you for your colonoscopy.  I think seeing the neurosurgeon is a good idea.  You probably have some pinched nerves in your back that are causing your symptoms.  Take care, Dr Jerline Pain  PLEASE NOTE:  If you had any lab tests, please let us know if you have not heard back within a few days. You may see your results on mychart before we have a chance to review them but we will give you a call once they are reviewed by [place].   If we ordered any referrals today, please let us know if you have not heard from their office within the next week.   If you had any urgent prescriptions sent in today, please check with the pharmacy within an hour of our visit to make sure the prescription was transmitted appropriately.   Please try these tips to maintain a healthy lifestyle:  Eat at least 3 REAL meals and 1-2 snacks per day.  Aim for no more than 5 hours between eating.  If you eat breakfast, please do so within one hour of getting up.   Each meal should contain half fruits/vegetables, one quarter protein, and one quarter carbs (no bigger than a computer mouse)  Cut down on sweet beverages. This includes juice, soda, and sweet tea.   Drink at least 1 glass of water with each meal and aim for at least 8 glasses per day  Exercise at least 150 minutes every week.

## 2023-03-23 NOTE — Assessment & Plan Note (Signed)
Follows with rheumatology for this.  Currently on prednisone 6 mg daily.  Overall symptoms are persistent however manageable.  They are working on weaning down his prednisone.

## 2023-03-23 NOTE — Assessment & Plan Note (Signed)
Patient with several areas of neuropathic pain that is likely multifactorial.  He has had EMG studies performed which showed cervical radiculopathy, carpal tunnel syndrome, and ulnar neuropathy.  He is having more neuropathic pain in his legs as well and likely has some component of lumbar radiculopathy as well.  Currently on gabapentin 100 mg twice daily.  This does cause some grogginess but otherwise is tolerating well.  Will be following up with neurosurgeon tomorrow.  Will likely need to get further imaging to further evaluate for his lower extremity neuropathic pain.

## 2023-03-23 NOTE — Progress Notes (Signed)
   Jim Mathis. is a 72 y.o. male who presents today for an office visit.  Assessment/Plan:  Chronic Problems Addressed Today: Polymyalgia rheumatica ([place]) Follows with rheumatology for this.  Currently on prednisone 6 mg daily.  Overall symptoms are persistent however manageable.  They are working on weaning down his prednisone.  Neuropathic pain Patient with several areas of neuropathic pain that is likely multifactorial.  He has had EMG studies performed which showed cervical radiculopathy, carpal tunnel syndrome, and ulnar neuropathy.  He is having more neuropathic pain in his legs as well and likely has some component of lumbar radiculopathy as well.  Currently on gabapentin 100 mg twice daily.  This does cause some grogginess but otherwise is tolerating well.  Will be following up with neurosurgeon tomorrow.  Will likely need to get further imaging to further evaluate for his lower extremity neuropathic pain.  Essential hypertension At goal on amlodipine 10 mg daily.  Preventative health care Needs colonoscopy.  Will place referral today.    Subjective:  HPI:  Patient here for follow-up.  Last saw him about 6 months ago.  Since our last visit he has been following with sports medicine and rheumatology for his PMR.  He has no acute concerns today but would like to update [place] on his treatment course for the last several months. He is currently on chronic prednisone 6mg  daily and gabapentin 100mg  twice daily. He has also been having neuropathic pain and is following with his specialists for this.  Currently overall feels like symptoms are stable though still does have quite a bit of pain that is impacting his ADLs.  He is having more neuropathic pain in the lower extremities recently as well.  He will be seeing a neurosurgeon tomorrow to discuss this.       Objective:  Physical Exam: BP 123/72   Pulse 92   Temp 98.3 F (36.8 C) (Oral)   Ht 5\' 10"  (1.778 m)   Wt 192 lb 9.6 oz  (87.4 kg)   SpO2 96%   BMI 27.64 kg/m   Wt Readings from Last 3 Encounters:  03/23/23 192 lb 9.6 oz (87.4 kg)  01/31/23 196 lb (88.9 kg)  12/28/22 193 lb (87.5 kg)    Gen: No acute distress, resting comfortably  Neuro: Grossly normal, moves all extremities Psych: Normal affect and thought content      Jim Mathis M. Jerline Pain, MD 03/23/2023 10:00 AM

## 2023-03-23 NOTE — Assessment & Plan Note (Signed)
At goal on amlodipine 10 mg daily. 

## 2023-03-24 DIAGNOSIS — I739 Peripheral vascular disease, unspecified: Secondary | ICD-10-CM | POA: Diagnosis not present

## 2023-03-24 DIAGNOSIS — M542 Cervicalgia: Secondary | ICD-10-CM | POA: Diagnosis not present

## 2023-03-24 DIAGNOSIS — Z6827 Body mass index (BMI) 27.0-27.9, adult: Secondary | ICD-10-CM | POA: Diagnosis not present

## 2023-03-24 DIAGNOSIS — M5440 Lumbago with sciatica, unspecified side: Secondary | ICD-10-CM | POA: Diagnosis not present

## 2023-03-28 ENCOUNTER — Ambulatory Visit (HOSPITAL_COMMUNITY)
Admission: RE | Admit: 2023-03-28 | Discharge: 2023-03-28 | Disposition: A | Discharge status: Home / Self Care | Payer: PPO | Source: Ambulatory Visit | Attending: Surgery | Admitting: Surgery

## 2023-03-28 ENCOUNTER — Other Ambulatory Visit (HOSPITAL_COMMUNITY): Payer: Self-pay | Admitting: Neurosurgery

## 2023-03-28 DIAGNOSIS — I739 Peripheral vascular disease, unspecified: Secondary | ICD-10-CM

## 2023-03-28 LAB — VAS US ABI WITH/WO TBI
Left ABI: 0.87
Right ABI: 0.92

## 2023-03-29 ENCOUNTER — Ambulatory Visit: Payer: PPO | Admitting: Family Medicine

## 2023-03-30 ENCOUNTER — Encounter: Payer: Self-pay | Admitting: Family Medicine

## 2023-04-04 DIAGNOSIS — M542 Cervicalgia: Secondary | ICD-10-CM | POA: Diagnosis not present

## 2023-04-04 DIAGNOSIS — M7552 Bursitis of left shoulder: Secondary | ICD-10-CM | POA: Diagnosis not present

## 2023-04-04 DIAGNOSIS — M5416 Radiculopathy, lumbar region: Secondary | ICD-10-CM | POA: Diagnosis not present

## 2023-04-04 DIAGNOSIS — M353 Polymyalgia rheumatica: Secondary | ICD-10-CM | POA: Diagnosis not present

## 2023-04-04 DIAGNOSIS — M419 Scoliosis, unspecified: Secondary | ICD-10-CM | POA: Diagnosis not present

## 2023-04-04 DIAGNOSIS — M0609 Rheumatoid arthritis without rheumatoid factor, multiple sites: Secondary | ICD-10-CM | POA: Diagnosis not present

## 2023-04-04 DIAGNOSIS — G5600 Carpal tunnel syndrome, unspecified upper limb: Secondary | ICD-10-CM | POA: Diagnosis not present

## 2023-04-06 DIAGNOSIS — R202 Paresthesia of skin: Secondary | ICD-10-CM | POA: Diagnosis not present

## 2023-04-06 DIAGNOSIS — M545 Low back pain, unspecified: Secondary | ICD-10-CM | POA: Diagnosis not present

## 2023-04-06 DIAGNOSIS — M5440 Lumbago with sciatica, unspecified side: Secondary | ICD-10-CM | POA: Diagnosis not present

## 2023-04-07 DIAGNOSIS — M5440 Lumbago with sciatica, unspecified side: Secondary | ICD-10-CM | POA: Diagnosis not present

## 2023-04-07 DIAGNOSIS — Z6827 Body mass index (BMI) 27.0-27.9, adult: Secondary | ICD-10-CM | POA: Diagnosis not present

## 2023-04-07 DIAGNOSIS — G5603 Carpal tunnel syndrome, bilateral upper limbs: Secondary | ICD-10-CM | POA: Diagnosis not present

## 2023-04-20 ENCOUNTER — Other Ambulatory Visit: Payer: Self-pay | Admitting: Family Medicine

## 2023-04-20 NOTE — Telephone Encounter (Signed)
Prescription Request  04/20/2023  LOV: 03/23/2023  What is the name of the medication or equipment?  doxepin (SINEQUAN) 25 MG capsule   Have you contacted your pharmacy to request a refill? Yes   Which pharmacy would you like this sent to?  [facility], [place] - [address] [place] [address] [place] [address] Phone: [phone] Fax: [phone]   Patient notified that their request is being sent to the clinical staff for review and that they should receive a response within 2 business days.   Please advise at Mobile [phone] (mobile)

## 2023-04-21 DIAGNOSIS — G5603 Carpal tunnel syndrome, bilateral upper limbs: Secondary | ICD-10-CM | POA: Diagnosis not present

## 2023-04-21 MED ORDER — DOXEPIN HCL 25 MG PO CAPS: 25.0000 mg | ORAL_CAPSULE | Freq: Every day | ORAL | 1 refills | Status: DC

## 2023-05-02 DIAGNOSIS — M5416 Radiculopathy, lumbar region: Secondary | ICD-10-CM | POA: Diagnosis not present

## 2023-05-09 ENCOUNTER — Ambulatory Visit (INDEPENDENT_AMBULATORY_CARE_PROVIDER_SITE_OTHER): Payer: PPO

## 2023-05-09 VITALS — Wt 192.0 lb

## 2023-05-09 DIAGNOSIS — Z0000 Encounter for general adult medical examination without abnormal findings: Secondary | ICD-10-CM | POA: Diagnosis not present

## 2023-05-09 NOTE — Patient Instructions (Signed)
Jim Mathis , Thank you for taking time to come for your Medicare Wellness Visit. I appreciate your ongoing commitment to your health goals. Please review the following plan we discussed and let me know if I can assist you in the future.   These are the goals we discussed:  Goals      Patient Stated     Stay active      Patient Stated     Stay healthy     Patient Stated     Lose weight      PharmD Care Plan     CARE PLAN ENTRY (see longitudinal plan of care for additional care plan information)  Current Barriers:  Chronic Disease Management support, education, and care coordination needs related to Hypertension, Hyperlipidemia, and BPH   Hypertension BP Readings from Last 3 Encounters:  05/06/20 118/70  03/11/20 136/74  12/19/19 139/75  Pharmacist Clinical Goal(s): Over the next 180 days, patient will work with PharmD and providers to maintain BP goal <130/80 Current regimen:  Amlodipine 10 mg once daily Interventions: Diet/exercise recommendations Patient self care activities - Over the next 180 days, patient will: Check BP at least once every 1-2 weeks, document, and provide at future appointments Ensure daily salt intake < 2300 mg/day  Hyperlipidemia Lab Results  Component Value Date/Time   LDLCALC 102 (H) 05/06/2020 10:27 AM  Pharmacist Clinical Goal(s): Over the next 180 days, patient will work with PharmD and providers to achieve LDL goal < 100 Current regimen:  Atorvastatin 40 mg once daily Interventions: Diet/exercise recommendations Patient self care activities - Over the next 180 days, patient will: Continue current management  BPH/ED Pharmacist Clinical Goal(s) Over the next 180 days, patient will work with PharmD and providers to minimize symptoms of BPH/ED Current regimen:  Finasteride 5 mg once daily at bedtime Tadalafil 20 mg once daily as needed Interventions: Continue current management Patient self care activities - Over the next 180 days,  patient will: Continue current management  Medication management Pharmacist Clinical Goal(s): Over the next 180 days, patient will work with PharmD and providers to maintain optimal medication adherence Current pharmacy: [facility] Interventions Comprehensive medication review performed. Continue current medication management strategy Patient self care activities - Over the next 180 days, patient will: Take medications as prescribed Report any questions or concerns to PharmD and/or provider(s) Initial goal documentation.        This is a list of the screening recommended for you and due dates:  Health Maintenance  Topic Date Due   COVID-19 Vaccine (7 - 2023-24 season) 12/15/2022   Flu Shot  07/21/2023   Medicare Annual Wellness Visit  05/08/2024   Colon Cancer Screening  01/26/2027   DTaP/Tdap/Td vaccine (2 - Td or Tdap) 02/09/2028   Pneumonia Vaccine  Completed   Hepatitis C Screening: USPSTF Recommendation to screen - Ages 42-79 yo.  Completed   Zoster (Shingles) Vaccine  Completed   HPV Vaccine  Aged Out    Advanced directives: copies in chart   Conditions/risks identified: none at this time   Next appointment: Follow up in one year for your annual wellness visit.   Preventive Care 50 Years and Older, Male  Preventive care refers to lifestyle choices and visits with your health care provider that can promote health and wellness. What does preventive care include? A yearly physical exam. This is also called an annual well check. Dental exams once or twice a year. Routine eye exams. Ask your health care provider  how often you should have your eyes checked. Personal lifestyle choices, including: Daily care of your teeth and gums. Regular physical activity. Eating a healthy diet. Avoiding tobacco and drug use. Limiting alcohol use. Practicing safe sex. Taking low doses of aspirin every day. Taking vitamin and mineral supplements as recommended by your health  care provider. What happens during an annual well check? The services and screenings done by your health care provider during your annual well check will depend on your age, overall health, lifestyle risk factors, and family history of disease. Counseling  Your health care provider may ask you questions about your: Alcohol use. Tobacco use. Drug use. Emotional well-being. Home and relationship well-being. Sexual activity. Eating habits. History of falls. Memory and ability to understand (cognition). Work and work Astronomer. Screening  You may have the following tests or measurements: Height, weight, and BMI. Blood pressure. Lipid and cholesterol levels. These may be checked every 5 years, or more frequently if you are over 55 years old. Skin check. Lung cancer screening. You may have this screening every year starting at age 35 if you have a 30-pack-year history of smoking and currently smoke or have quit within the past 15 years. Fecal occult blood test (FOBT) of the stool. You may have this test every year starting at age 38. Flexible sigmoidoscopy or colonoscopy. You may have a sigmoidoscopy every 5 years or a colonoscopy every 10 years starting at age 67. Prostate cancer screening. Recommendations will vary depending on your family history and other risks. Hepatitis C blood test. Hepatitis B blood test. Sexually transmitted disease (STD) testing. Diabetes screening. This is done by checking your blood sugar (glucose) after you have not eaten for a while (fasting). You may have this done every 1-3 years. Abdominal aortic aneurysm (AAA) screening. You may need this if you are a current or former smoker. Osteoporosis. You may be screened starting at age 85 if you are at high risk. Talk with your health care provider about your test results, treatment options, and if necessary, the need for more tests. Vaccines  Your health care provider may recommend certain vaccines, such  as: Influenza vaccine. This is recommended every year. Tetanus, diphtheria, and acellular pertussis (Tdap, Td) vaccine. You may need a Td booster every 10 years. Zoster vaccine. You may need this after age 32. Pneumococcal 13-valent conjugate (PCV13) vaccine. One dose is recommended after age 68. Pneumococcal polysaccharide (PPSV23) vaccine. One dose is recommended after age 20. Talk to your health care provider about which screenings and vaccines you need and how often you need them. This information is not intended to replace advice given to you by your health care provider. Make sure you discuss any questions you have with your health care provider. Document Released: 01/02/2016 Document Revised: 08/25/2016 Document Reviewed: 10/07/2015 Elsevier Interactive Patient Education  2017 [employer].  Fall Prevention in the Home Falls can cause injuries. They can happen to people of all ages. There are many things you can do to make your home safe and to help prevent falls. What can I do on the outside of my home? Regularly fix the edges of walkways and driveways and fix any cracks. Remove anything that might make you trip as you walk through a door, such as a raised step or threshold. Trim any bushes or trees on the path to your home. Use bright outdoor lighting. Clear any walking paths of anything that might make someone trip, such as rocks or tools. Regularly check to  see if handrails are loose or broken. Make sure that both sides of any steps have handrails. Any raised decks and porches should have guardrails on the edges. Have any leaves, snow, or ice cleared regularly. Use sand or salt on walking paths during winter. Clean up any spills in your garage right away. This includes oil or grease spills. What can I do in the bathroom? Use night lights. Install grab bars by the toilet and in the tub and shower. Do not use towel bars as grab bars. Use non-skid mats or decals in the tub or  shower. If you need to sit down in the shower, use a plastic, non-slip stool. Keep the floor dry. Clean up any water that spills on the floor as soon as it happens. Remove soap buildup in the tub or shower regularly. Attach bath mats securely with double-sided non-slip rug tape. Do not have throw rugs and other things on the floor that can make you trip. What can I do in the bedroom? Use night lights. Make sure that you have a light by your bed that is easy to reach. Do not use any sheets or blankets that are too big for your bed. They should not hang down onto the floor. Have a firm chair that has side arms. You can use this for support while you get dressed. Do not have throw rugs and other things on the floor that can make you trip. What can I do in the kitchen? Clean up any spills right away. Avoid walking on wet floors. Keep items that you use a lot in easy-to-reach places. If you need to reach something above you, use a strong step stool that has a grab bar. Keep electrical cords out of the way. Do not use floor polish or wax that makes floors slippery. If you must use wax, use non-skid floor wax. Do not have throw rugs and other things on the floor that can make you trip. What can I do with my stairs? Do not leave any items on the stairs. Make sure that there are handrails on both sides of the stairs and use them. Fix handrails that are broken or loose. Make sure that handrails are as long as the stairways. Check any carpeting to make sure that it is firmly attached to the stairs. Fix any carpet that is loose or worn. Avoid having throw rugs at the top or bottom of the stairs. If you do have throw rugs, attach them to the floor with carpet tape. Make sure that you have a light switch at the top of the stairs and the bottom of the stairs. If you do not have them, ask someone to add them for you. What else can I do to help prevent falls? Wear shoes that: Do not have high heels. Have  rubber bottoms. Are comfortable and fit you well. Are closed at the toe. Do not wear sandals. If you use a stepladder: Make sure that it is fully opened. Do not climb a closed stepladder. Make sure that both sides of the stepladder are locked into place. Ask someone to hold it for you, if possible. Clearly mark and make sure that you can see: Any grab bars or handrails. First and last steps. Where the edge of each step is. Use tools that help you move around (mobility aids) if they are needed. These include: Canes. Walkers. Scooters. Crutches. Turn on the lights when you go into a dark area. Replace any light bulbs as  soon as they burn out. Set up your furniture so you have a clear path. Avoid moving your furniture around. If any of your floors are uneven, fix them. If there are any pets around you, be aware of where they are. Review your medicines with your doctor. Some medicines can make you feel dizzy. This can increase your chance of falling. Ask your doctor what other things that you can do to help prevent falls. This information is not intended to replace advice given to you by your health care provider. Make sure you discuss any questions you have with your health care provider. Document Released: 10/02/2009 Document Revised: 05/13/2016 Document Reviewed: 01/10/2015 Elsevier Interactive Patient Education  2017 [employer].

## 2023-05-09 NOTE — Progress Notes (Signed)
I connected with  Jim Mathis. on 05/09/23 by a audio enabled telemedicine application and verified that I am speaking with the correct person using two identifiers.  Patient Location: Home  Provider Location: Office/Clinic  I discussed the limitations of evaluation and management by telemedicine. The patient expressed understanding and agreed to proceed.    Patient Medicare AWV questionnaire was completed by the patient on 05/05/23; I have confirmed that all information answered by patient is correct and no changes since this date.      Subjective:   Jim Muhs. is a 72 y.o. male who presents for Medicare Annual/Subsequent preventive examination.  Review of Systems     Cardiac Risk Factors include: advanced age (>37men, >24 women);hypertension;male gender;dyslipidemia     Objective:    Today's Vitals   05/09/23 0949  Weight: 192 lb (87.1 kg)   Body mass index is 27.55 kg/m.     05/09/2023    9:53 AM 08/06/2022   11:05 AM 04/26/2022    1:54 PM 05/08/2021    2:24 AM 05/07/2021    9:59 PM 04/20/2021    2:02 PM 03/11/2020    3:16 PM  Advanced Directives  Does Patient Have a Medical Advance Directive? Yes Yes Yes Yes No Yes Yes  Type of Estate agent of [place];Living will Living will Healthcare Power of [employer] Power of [place];Living will  Living will Living will;Healthcare Power of Attorney  Does patient want to make changes to medical advance directive? No - Patient declined No - Patient declined  Yes (Inpatient - patient defers changing a medical advance directive and declines information at this time)   No - Patient declined  Copy of Healthcare Power of Attorney in Chart? Yes - validated most recent copy scanned in chart (See row information)  Yes - validated most recent copy scanned in chart (See row information) No - copy requested   No - copy requested    Current Medications (verified) Outpatient Encounter Medications as of  05/09/2023  Medication Sig   amLODipine (NORVASC) 10 MG tablet Take 1 tablet by mouth daily   atorvastatin (LIPITOR) 40 MG tablet Take 1 tablet by mouth daily   doxepin (SINEQUAN) 25 MG capsule Take 1 capsule (25 mg total) by mouth daily.   finasteride (PROSCAR) 5 MG tablet Take 1 tablet (5 mg total) by mouth at bedtime.   gabapentin (NEURONTIN) 300 MG capsule Take 300 mg by mouth at bedtime.   predniSONE (STERAPRED UNI-PAK 48 TAB) 10 MG (48) TBPK tablet Take by mouth daily. 12 day dosepack po   tadalafil (CIALIS) 20 MG tablet TAKE ONE TABLET BY MOUTH DAILY   triamcinolone ointment (KENALOG) 0.5 % APPLY TOPICALLY TO AFFECTED AREA(S) THREE TIMES A DAY   [DISCONTINUED] gabapentin (NEURONTIN) 100 MG capsule Take 100 mg by mouth 2 (two) times daily.   [DISCONTINUED] meloxicam (MOBIC) 15 MG tablet TAKE 1 TABLET (15 MG TOTAL) BY MOUTH DAILY.   No facility-administered encounter medications on file as of 05/09/2023.    Allergies (verified) Pollen extract   History: Past Medical History:  Diagnosis Date   BPH with obstruction/lower urinary tract symptoms    Carotid stenosis, right    per pt was told he has some stenosis but not enough for surgery;   in epic under media ,  carotid ultrasound result right ICA 50-69% and incidental finding thyroid nodule   Cervicalgia    ED (erectile dysfunction)    History of colon resection  per pt 12 hours after partial knee arthroplasty 2017,  s/p partial colectomy was possible from divertiulitis   Hyperlipidemia    Hypertension    Migraines    PAD (peripheral artery disease) (HCC) 08-14-2019  per pt when he walks (walks 2 miles a day) right foot gets numb but recovers quickly and when he rides his bike left hand gets numb by recovers quickly ;   denies claudication or swelling   per pt s/p  stenting to bilateral lower legs in [place] one in 2003 and the other 2007,  no follow up since moved to [facility]   Wears glasses    Past Surgical History:  Procedure  Laterality Date   COLOSTOMY REVERSAL  02-21-2017     [place], [place] (OR record scanned in epic)   w/ sigmoid and descending colectomy with appendectomy   CYSTOSCOPY WITH INJECTION N/A 12/19/2019   Procedure: CYSTOSCOPY WITH INJECTION;  Surgeon: Sebastian Ache, MD;  Location: [facility];  Service: Urology;  Laterality: N/A;   CYSTOSCOPY WITH URETHRAL DILATATION N/A 12/19/2019   Procedure: CYSTOSCOPY WITH URETHRAL DILATATION;  Surgeon: Sebastian Ache, MD;  Location: [facility];  Service: Urology;  Laterality: N/A;  45 MINS   ENDOVASCULAR STENT INSERTION  2003 and 2007  --- both done in [place]   bilateral legs   PARTIAL COLECTOMY  09-04-2016  in [place], [place]   w/  colostomy for perferated divierticulitis   PARTIAL KNEE ARTHROPLASTY Left 2017   TOTAL KNEE ARTHROPLASTY Right 2012   TRANSURETHRAL RESECTION OF PROSTATE N/A 08/15/2019   Procedure: TRANSURETHRAL RESECTION OF THE PROSTATE (TURP);  Surgeon: Sebastian Ache, MD;  Location: [facility];  Service: Urology;  Laterality: N/A;   Family History  Problem Relation Age of Onset   Alzheimer's disease Mother    Lung cancer Father        non smoker   Social History   Socioeconomic History   Marital status: Married    Spouse name: Not on file   Number of children: Not on file   Years of education: Not on file   Highest education level: Master's degree (e.g., MA, MS, MEng, MEd, MSW, MBA)  Occupational History   Not on file  Tobacco Use   Smoking status: Former    Years: 20    Types: Cigarettes    Quit date: 05/23/1994    Years since quitting: 28.9   Smokeless tobacco: Never  Vaping Use   Vaping Use: Never used  Substance and Sexual Activity   Alcohol use: Yes    Comment: occasionally   Drug use: Never   Sexual activity: Yes  Other Topics Concern   Not on file  Social History Narrative   Right handed   Lives in two story home with wife   Social Determinants of Health   Financial  Resource Strain: Low Risk  (05/05/2023)   Overall Financial Resource Strain (CARDIA)    Difficulty of Paying Living Expenses: Not hard at all  Food Insecurity: No Food Insecurity (05/05/2023)   Hunger Vital Sign    Worried About Running Out of Food in the Last Year: Never true    Ran Out of Food in the Last Year: Never true  Transportation Needs: No Transportation Needs (05/05/2023)   PRAPARE - Administrator, Civil Service (Medical): No    Lack of Transportation (Non-Medical): No  Physical Activity: Sufficiently Active (05/05/2023)   Exercise Vital Sign    Days of Exercise per  Week: 3 days    Minutes of Exercise per Session: 60 min  Stress: Stress Concern Present (05/05/2023)   [employer] of Occupational Health - Occupational Stress Questionnaire    Feeling of Stress : To some extent  Social Connections: Moderately Integrated (05/05/2023)   Social Connection and Isolation Panel [NHANES]    Frequency of Communication with Friends and Family: Three times a week    Frequency of Social Gatherings with Friends and Family: Once a week    Attends Religious Services: More than 4 times per year    Active Member of [employer] or Organizations: No    Attends Engineer, structural: Never    Marital Status: Married    Tobacco Counseling Counseling given: Not Answered   Clinical Intake:  Pre-visit preparation completed: Yes  Pain : No/denies pain     BMI - recorded: 27.55 Nutritional Status: BMI 25 -29 Overweight Nutritional Risks: None Diabetes: No  How often do you need to have someone help you when you read instructions, pamphlets, or other written materials from your doctor or pharmacy?: 1 - Never  Diabetic?no  Interpreter Needed?: No  Information entered by :: Lanier Ensign, LPN   Activities of Daily Living    05/05/2023    9:17 AM  In your present state of health, do you have any difficulty performing the following activities:  Hearing? 1  Comment  hearing aids  Vision? 0  Difficulty concentrating or making decisions? 0  Walking or climbing stairs? 0  Dressing or bathing? 0  Doing errands, shopping? 0  Preparing Food and eating ? N  Using the Toilet? N  In the past six months, have you accidently leaked urine? Y  Comment at times  Do you have problems with loss of bowel control? Y  Comment at times  Managing your Medications? N  Managing your Finances? N  Housekeeping or managing your Housekeeping? N    Patient Care Team: Ardith Dark, MD as PCP - General (Family Medicine) Drema Dallas, DO as Consulting Physician (Neurology) Berneice Heinrich Delbert Phenix., MD as Consulting Physician (Urology) [name], [facility] (Inactive) as Pharmacist (Pharmacist)  Indicate any recent Medical Services you may have received from other than [facility] providers in the past year (date may be approximate).     Assessment:   This is a routine wellness examination for Beren.  Hearing/Vision screen Hearing Screening - Comments:: Pt wears hearing aids  Vision Screening - Comments:: Pt follows up with [facility] for annual eye exams   Dietary issues and exercise activities discussed: Current Exercise Habits: Home exercise routine, Type of exercise: Other - see comments, Time (Minutes): 60, Frequency (Times/Week): 3, Weekly Exercise (Minutes/Week): 180   Goals Addressed             This Visit's Progress    Patient Stated       Lose weight        Depression Screen    05/09/2023    9:53 AM 03/23/2023    9:16 AM 08/31/2022    8:54 AM 07/19/2022    3:04 PM 06/25/2022    1:35 PM 05/24/2022   10:56 AM 04/26/2022    1:53 PM  PHQ 2/9 Scores  PHQ - 2 Score 0 0 0 0 0 0 0    Fall Risk    05/05/2023    9:17 AM 03/23/2023    9:16 AM 08/31/2022    8:54 AM 07/19/2022    3:05 PM 06/25/2022  1:35 PM  Fall Risk   Falls in the past year? 0 0 0 0 0  Number falls in past yr: 0 0 0 0 0  Injury with Fall? 0 0 0 0 0  Risk for fall due to : Impaired vision No  Fall Risks No Fall Risks No Fall Risks No Fall Risks  Follow up Falls prevention discussed        FALL RISK PREVENTION PERTAINING TO THE HOME:  Any stairs in or around the home? No  If so, are there any without handrails? No  Home free of loose throw rugs in walkways, pet beds, electrical cords, etc? Yes  Adequate lighting in your home to reduce risk of falls? Yes   ASSISTIVE DEVICES UTILIZED TO PREVENT FALLS:  Life alert? No  Use of a cane, walker or w/c? No  Grab bars in the bathroom? Yes  Shower chair or bench in shower? No  Elevated toilet seat or a handicapped toilet? No   TIMED UP AND GO:  Was the test performed? No .    Cognitive Function:        05/09/2023    9:55 AM 04/26/2022    1:58 PM 04/20/2021    2:10 PM 03/11/2020    3:17 PM  6CIT Screen  What Year? 0 points 0 points 0 points 0 points  What month? 0 points 0 points 0 points 0 points  What time? 0 points 0 points  0 points  Count back from 20 0 points 0 points 0 points 0 points  Months in reverse 0 points 0 points 0 points 0 points  Repeat phrase 0 points 0 points 0 points 0 points  Total Score 0 points 0 points  0 points    Immunizations Immunization History  Administered Date(s) Administered   COVID-19, mRNA, vaccine(Comirnaty)12 years and older 10/20/2022   Fluad Quad(high Dose 65+) 08/31/2022   Influenza, High Dose Seasonal PF 08/03/2019   Influenza-Unspecified 09/23/2020, 08/20/2021   PFIZER(Purple Top)SARS-COV-2 Vaccination 02/02/2020, 02/27/2020, 09/23/2020, 04/24/2021   Pfizer Covid-19 Vaccine Bivalent Booster 67yrs & up 10/24/2021   Pneumococcal Conjugate-13 01/25/2016   Pneumococcal Polysaccharide-23 01/24/2017   Tdap 02/08/2018   Zoster Recombinat (Shingrix) 07/15/2019, 04/09/2020   Zoster, Live 04/09/2020    TDAP status: Up to date  Flu Vaccine status: Up to date  Pneumococcal vaccine status: Up to date  Covid-19 vaccine status: Completed vaccines  Qualifies for Shingles Vaccine?  Yes   Zostavax completed Yes   Shingrix Completed?: Yes  Screening Tests Health Maintenance  Topic Date Due   COVID-19 Vaccine (7 - 2023-24 season) 12/15/2022   INFLUENZA VACCINE  07/21/2023   Medicare Annual Wellness (AWV)  05/08/2024   COLONOSCOPY (Pts 45-56yrs Insurance coverage will need to be confirmed)  01/26/2027   DTaP/Tdap/Td (2 - Td or Tdap) 02/09/2028   Pneumonia Vaccine 101+ Years old  Completed   Hepatitis C Screening  Completed   Zoster Vaccines- Shingrix  Completed   HPV VACCINES  Aged Out    Health Maintenance  Health Maintenance Due  Topic Date Due   COVID-19 Vaccine (7 - 2023-24 season) 12/15/2022    Colorectal cancer screening: Type of screening: Colonoscopy. Completed 01/26/17. Repeat every 10 years  Additional Screening:  Hepatitis C Screening:  Completed 08/31/22  Vision Screening: Recommended annual ophthalmology exams for early detection of glaucoma and other disorders of the eye. Is the patient up to date with their annual eye exam?  Yes  Who is the provider or what  is the name of the office in which the patient attends annual eye exams? [facility]  If pt is not established with a provider, would they like to be referred to a provider to establish care? No .   Dental Screening: Recommended annual dental exams for proper oral hygiene  Community Resource Referral / Chronic Care Management: CRR required this visit?  No   CCM required this visit?  No      Plan:     I have personally reviewed and noted the following in the patient's chart:   Medical and social history Use of alcohol, tobacco or illicit drugs  Current medications and supplements including opioid prescriptions. Patient is not currently taking opioid prescriptions. Functional ability and status Nutritional status Physical activity Advanced directives List of other physicians Hospitalizations, surgeries, and ER visits in previous 12 months Vitals Screenings to include  cognitive, depression, and falls Referrals and appointments  In addition, I have reviewed and discussed with patient certain preventive protocols, quality metrics, and best practice recommendations. A written personalized care plan for preventive services as well as general preventive health recommendations were provided to patient.     Marzella Schlein, LPN   1/61/0960   Nurse Notes: none

## 2023-05-10 NOTE — Progress Notes (Signed)
05/13/2023 Jim Mathis [id number] 15-Jul-1951  Referring provider: Ardith Dark, MD Primary GI doctor: Dr. Barron Alvine  ASSESSMENT AND PLAN:  Frequent stools some associated bloating Has had pelvic PT in the past after prostate surgery Has incomplete Bm's No weight loss, no diarrhea, no hematochezia Last colonoscopy 2018, recall 5 years, no family history, unknown last colonoscopy but possible polyps. Had fair prep.  With change in bowel habits and last colonoscopy with fair prep, will add on miralax and schedule recall colon at [facility]. We have discussed the risks of bleeding, infection, perforation, medication reactions, and remote risk of death associated with colonoscopy. All questions were answered and the patient acknowledges these risk and wishes to proceed. Consider further treatment/evaluation with cross sectional imaging if negative or for pelvic floor, IBS, SIBO  Diverticulosis of colon without hemorrhage S/p colectomy 2018 Will call if any symptoms. Add on fiber supplement, avoid NSAIDS, information given   Patient Care Team: Ardith Dark, MD as PCP - General (Family Medicine) Drema Dallas, DO as Consulting Physician (Neurology) Berneice Heinrich Delbert Phenix., MD as Consulting Physician (Urology) [name], [facility] (Inactive) as Pharmacist (Pharmacist)  HISTORY OF PRESENT ILLNESS: 72 y.o. male with a past medical history of hypertension, hyperlipidemia, peripheral chill disease status post stent bilateral lower legs 2003 and again in 2007, history of partial colectomy 2017 after partial knee arthroplasty, some notes say perforated diverticulitis, reversal 2018, prostate surgery 2020 had some pelvic floor after that and others listed below presents for evaluation of bloating, frequent Bm's.   Moved here from [place] for retirement.  01/26/2017 colonoscopy in [facility] showed moderate diverticulosis, fair/good prep recall 5 years. Prior to reversal.   2022 CT abdomen pelvis without contrast for nausea and abdominal pain changes consistent with partial small bowel obstruction in the mid to distal jejunum felt to be related to adhesions, more distal small bowel is within normal limits no other acute abnormality, aortic atherosclerosis Labs 09/22/2022 show no anemia, normal kidney liver  Patient denies family history of colon cancer or other gastrointestinal malignancies. Patient does not recall previous colonoscopy to 2018, but may have had polpys. Last colon had fair prep.   He has had frequent Bm's, associated with food the last 3-4 years. About 20-30 mins after he eats will have BM.  Will eat probiotic yogurt in the morning and feels more laxative effect with coffee 3-4 cups.  At least 3 Bm's during the day, formed stools, not diarrhea.  Feels he has incomplete Bm's.  No hematochezia.  Will have bloating, feels he can not eat much anymore.  No weight loss.  He has been on prednisone for a year for PMR. Denies GERD, nausea, vomiting.  He walks 12-15 miles a week.  No SOB, no chest pain, no leg swelling.   He denies blood thinner use.  He denies NSAID use.  He reports ETOH use, 2-3 x a week, 2-3 beer or wine He denies tobacco use.  He denies drug use.    He  reports that he quit smoking about 28 years ago. His smoking use included cigarettes. He has never used smokeless tobacco. He reports current alcohol use. He reports that he does not use drugs.  RELEVANT LABS AND IMAGING: CBC    Component Value Date/Time   WBC 10.3 09/22/2022 0850   RBC 4.62 09/22/2022 0850   HGB 13.9 09/22/2022 0850   HCT 42.6 09/22/2022 0850   PLT 213.0 09/22/2022 0850  MCV 92.2 09/22/2022 0850   MCH 30.2 05/10/2021 0212   MCHC 32.6 09/22/2022 0850   RDW 16.9 (H) 09/22/2022 0850   LYMPHSABS 1.2 05/07/2021 2208   MONOABS 0.9 05/07/2021 2208   EOSABS 0.2 05/07/2021 2208   BASOSABS 0.1 05/07/2021 2208   Recent Labs    08/31/22 1155 09/22/22 0850   HGB 13.8 13.9    CMP     Component Value Date/Time   NA 136 09/22/2022 0850   K 4.2 09/22/2022 0850   CL 104 09/22/2022 0850   CO2 25 09/22/2022 0850   GLUCOSE 125 (H) 09/22/2022 0850   BUN 22 09/22/2022 0850   CREATININE 0.88 09/22/2022 0850   CALCIUM 9.0 09/22/2022 0850   PROT 6.3 09/22/2022 0850   ALBUMIN 3.9 09/22/2022 0850   AST 13 09/22/2022 0850   ALT 15 09/22/2022 0850   ALKPHOS 60 09/22/2022 0850   BILITOT 0.4 09/22/2022 0850   GFRNONAA >60 05/10/2021 0212      Latest Ref Rng & Units 09/22/2022    8:50 AM 08/31/2022   11:55 AM 08/19/2022    1:24 PM  Hepatic Function  Total Protein 6.0 - 8.3 g/dL 6.3  6.7  7.5   Albumin 3.5 - 5.2 g/dL 3.9  3.7  4.0   AST 0 - 37 U/L 13  14  17    ALT 0 - 53 U/L 15  19  15    Alk Phosphatase 39 - 117 U/L 60  68  98   Total Bilirubin 0.2 - 1.2 mg/dL 0.4  0.4  0.4       Current Medications:   Current Outpatient Medications (Endocrine & Metabolic):    predniSONE (STERAPRED UNI-PAK 48 TAB) 10 MG (48) TBPK tablet, Take by mouth daily. 12 day dosepack po  Current Outpatient Medications (Cardiovascular):    amLODipine (NORVASC) 10 MG tablet, Take 1 tablet by mouth daily   atorvastatin (LIPITOR) 40 MG tablet, Take 1 tablet by mouth daily   tadalafil (CIALIS) 20 MG tablet, TAKE ONE TABLET BY MOUTH DAILY     Current Outpatient Medications (Other):    doxepin (SINEQUAN) 25 MG capsule, Take 1 capsule (25 mg total) by mouth daily.   finasteride (PROSCAR) 5 MG tablet, Take 1 tablet (5 mg total) by mouth at bedtime.   gabapentin (NEURONTIN) 300 MG capsule, Take 300 mg by mouth at bedtime.   Na Sulfate-K Sulfate-Mg Sulf 17.5-3.13-1.6 GM/177ML SOLN, Take 1 kit by mouth once for 1 dose.   triamcinolone ointment (KENALOG) 0.5 %, APPLY TOPICALLY TO AFFECTED AREA(S) THREE TIMES A DAY (Patient taking differently: As needed)  Medical History:  Past Medical History:  Diagnosis Date   BPH with obstruction/lower urinary tract symptoms    Carotid  stenosis, right    per pt was told he has some stenosis but not enough for surgery;   in epic under media ,  carotid ultrasound result right ICA 50-69% and incidental finding thyroid nodule   Cervicalgia    ED (erectile dysfunction)    History of colon resection    per pt 12 hours after partial knee arthroplasty 2017,  s/p partial colectomy was possible from divertiulitis   Hyperlipidemia    Hypertension    Migraines    PAD (peripheral artery disease) (HCC) 08-14-2019  per pt when he walks (walks 2 miles a day) right foot gets numb but recovers quickly and when he rides his bike left hand gets numb by recovers quickly ;   denies claudication or swelling  per pt s/p  stenting to bilateral lower legs in [place] one in 2003 and the other 2007,  no follow up since moved to [place]   Wears glasses    Allergies:  Allergies  Allergen Reactions   Pollen Extract Cough, Itching and Other (See Comments)     Surgical History:  He  has a past surgical history that includes Colostomy reversal (02-21-2017     [place], [place] (OR record scanned in epic)); Partial knee arthroplasty (Left, 2017); Partial colectomy (09-04-2016  in [place], [place]); Total knee arthroplasty (Right, 2012); Endovascular stent insertion (2003 and 2007  --- both done in [place]); Transurethral resection of prostate (N/A, 08/15/2019); Cystoscopy with urethral dilatation (N/A, 12/19/2019); Cystoscopy with injection (N/A, 12/19/2019); and Pilonidal cyst excision. Family History:  His family history includes ALS in his brother; Alzheimer's disease in his mother; COPD in his sister; Heart failure in his sister; Lung cancer in his father.  REVIEW OF SYSTEMS  : All other systems reviewed and negative except where noted in the History of Present Illness.  PHYSICAL EXAM: BP 112/60   Pulse 88   Ht 5\' 10"  (1.778 m)   Wt 191 lb (86.6 kg)   BMI 27.41 kg/m  General Appearance: Well nourished, in no apparent distress. Head:   Normocephalic and  atraumatic. Eyes:  sclerae anicteric,conjunctive pink  Respiratory: Respiratory effort normal, BS equal bilaterally without rales, rhonchi, wheezing. Cardio: Systolic murmur with radiation to carotids, regular rhythm. Peripheral pulses intact.  Abdomen: Soft,  Obese, well healed vertical AB scar with umbilical hernia ,active bowel sounds. mild tenderness in the LLQ. Without guarding and Without rebound. No masses. Rectal: Not evaluated Musculoskeletal: Full ROM, Normal gait. Without edema. Skin:  Dry and intact without significant lesions or rashes Neuro: Alert and  oriented x4;  No focal deficits. Psych:  Cooperative. Normal mood and affect.    Doree Albee, PA-C 11:44 AM

## 2023-05-13 ENCOUNTER — Encounter: Payer: Self-pay | Admitting: Physician Assistant

## 2023-05-13 ENCOUNTER — Ambulatory Visit: Payer: PPO | Admitting: Physician Assistant

## 2023-05-13 VITALS — BP 112/60 | HR 88 | Ht 70.0 in | Wt 191.0 lb

## 2023-05-13 DIAGNOSIS — K529 Noninfective gastroenteritis and colitis, unspecified: Secondary | ICD-10-CM | POA: Diagnosis not present

## 2023-05-13 DIAGNOSIS — K5730 Diverticulosis of large intestine without perforation or abscess without bleeding: Secondary | ICD-10-CM | POA: Diagnosis not present

## 2023-05-13 MED ORDER — NA SULFATE-K SULFATE-MG SULF 17.5-3.13-1.6 GM/177ML PO SOLN: 1.0000 | Freq: Once | ORAL | 0 refills | Status: AC

## 2023-05-13 NOTE — Patient Instructions (Addendum)
FODMAP stands for fermentable oligo-, di-, mono-saccharides and polyols (1). These are the scientific terms used to classify groups of carbs that are notorious for triggering digestive symptoms like bloating, gas and stomach pain.    You have been scheduled for a colonoscopy. Please follow written instructions given to you at your visit today.  Please pick up your prep supplies at the pharmacy within the next 1-3 days. If you use inhalers (even only as needed), please bring them with you on the day of your procedure.   Miralax is an osmotic laxative.  It only brings more water into the stool.  This is safe to take daily.  Can take up to 17 gram of miralax twice a day.  Mix with juice or coffee.  Start 1 capful at night for 3-4 days and reassess your response in 3-4 days.  You can increase and decrease the dose based on your response.  Remember, it can take up to 3-4 days to take effect OR for the effects to wear off.   I often pair this with benefiber in the morning to help assure the stool is not too loose.      Pelvic Floor Dysfunction, Male     Pelvic floor dysfunction (PFD) is a condition that results when the group of muscles and connective tissues that support the organs in the pelvis (pelvic floor muscles) do not work well. These muscles and their connections form a sling that supports the colon and bladder. In men, these muscles also support the prostate gland. PFD causes pelvic floor muscles to be too weak, too tight, or both. In PFD, muscle movements are not coordinated. This may cause bowel or bladder problems. It may also cause pain. What are the causes? This condition may be caused by an injury to the pelvic area or by a weakening of pelvic muscles. In many cases, the exact cause is not known. What increases the risk? The following factors may make you more likely to develop PFD: Having chronic bladder tissue inflammation (interstitial cystitis). Being an older  person. Being overweight. History of radiation treatment for cancer in the pelvic region. Previous pelvic surgery, such as removal of the prostate gland (prostatectomy). What are the signs or symptoms? Symptoms of this condition vary and may include: Bladder symptoms, such as: Trouble starting urination and emptying the bladder. Frequent urinary tract infections. Leaking urine when coughing, laughing, or exercising (stress incontinence). Having to pass urine urgently or frequently. Pain when passing urine. Bowel symptoms, such as: Constipation. Urgent or frequent bowel movements. Incomplete bowel movements. Painful bowel movements. Leaking stool or gas. Unexplained genital or rectal pain. Genital or rectal muscle spasms. Low back pain. Sexual dysfunction, such as erectile dysfunction, premature ejaculation, or pain during or after sexual activity. How is this diagnosed? This condition is diagnosed based on: Your symptoms and medical history. A physical exam. During the exam, your health care provider may check your pelvic muscles for tightness, spasm, pain, or weakness. This may include a rectal exam. In some cases, you may have diagnostic tests, such as: Electrical muscle function tests. Urine flow testing. X-ray tests of bowel function. Ultrasound of the pelvic organs. How is this treated? Treatment for this condition depends on your symptoms. Treatment options include: Physical therapy. This may include Kegel exercises to help relax or strengthen the pelvic floor muscles. Biofeedback. This type of therapy provides feedback on how tight your pelvic floor muscles are so that you can learn to control them. Massage  therapy. A treatment that involves electrical stimulation of the pelvic floor muscles to help control pain (transcutaneous electrical nerve stimulation, or TENS). Sound wave therapy (ultrasound) to reduce muscle spasms. Medicines, such as: Muscle relaxants. Bladder  control medicines. Surgery to reconstruct or support pelvic floor muscles may be an option if other treatments do not help. Follow these instructions at home: Activity Do your usual activities as told by your health care provider. Ask your health care provider if you should modify any activities. Do pelvic floor strengthening or relaxing exercises at home as told by your physical therapist. Lifestyle Maintain a healthy weight. Eat foods that are high in fiber, such as beans, whole grains, and fresh fruits and vegetables. Limit foods that are high in fat and processed sugars, such as fried or sweet foods. Manage stress with relaxation techniques such as yoga or meditation. General instructions If you have problems with leakage: Use absorbable pads or wear padded underwear. Wash your genital and anal area frequently with mild soap. Keep your genital and anal area as clean and dry as possible. Ask your health care provider if you should try a barrier cream to prevent skin irritation. Take warm baths to relieve pelvic muscle tension or spasms. Take over-the-counter and prescription medicines only as told by your health care provider. Keep all follow-up visits. How is this prevented? The cause of PFD is not always known, but there are a few things you can do to reduce the risk of developing this condition, including: Staying at a healthy weight. Getting regular exercise. Managing stress. Contact a health care provider if: Your symptoms are not improving with home care. You have signs or symptoms of PFD that get worse. You develop new signs or symptoms. You have signs of a urinary tract infection, such as: Fever. Chills. Increased urinary frequency. A burning feeling when urinating. You have not had a bowel movement in 3 days (constipation). Summary Pelvic floor dysfunction results when the muscles and connective tissues in your pelvic floor do not work well. These muscles and their  connections form a sling that supports your colon and bladder. In men, these muscles also support the prostate gland. PFD may be caused by an injury to the pelvic area or by a weakening of pelvic muscles. PFD causes pelvic floor muscles to be too weak, too tight, or a combination of both. Symptoms may vary from person to person. In most cases, PFD can be treated with physical therapies and medicines. Surgery may be an option if other treatments do not help. This information is not intended to replace advice given to you by your health care provider. Make sure you discuss any questions you have with your health care provider. Document Revised: 04/15/2021 Document Reviewed: 04/15/2021 Elsevier Patient Education  2024 [employer].

## 2023-05-19 NOTE — Progress Notes (Signed)
Agree with the assessment and plan as outlined by Amanda Collier, PA-C. ? ?Traquan Duarte, DO, FACG ? ?

## 2023-05-24 DIAGNOSIS — Z6827 Body mass index (BMI) 27.0-27.9, adult: Secondary | ICD-10-CM | POA: Diagnosis not present

## 2023-05-24 DIAGNOSIS — M5416 Radiculopathy, lumbar region: Secondary | ICD-10-CM | POA: Diagnosis not present

## 2023-05-24 DIAGNOSIS — G5603 Carpal tunnel syndrome, bilateral upper limbs: Secondary | ICD-10-CM | POA: Diagnosis not present

## 2023-05-30 ENCOUNTER — Other Ambulatory Visit: Payer: Self-pay | Admitting: Family Medicine

## 2023-06-08 DIAGNOSIS — G5602 Carpal tunnel syndrome, left upper limb: Secondary | ICD-10-CM | POA: Diagnosis not present

## 2023-06-15 DIAGNOSIS — X32XXXA Exposure to sunlight, initial encounter: Secondary | ICD-10-CM | POA: Diagnosis not present

## 2023-06-15 DIAGNOSIS — L570 Actinic keratosis: Secondary | ICD-10-CM | POA: Diagnosis not present

## 2023-06-15 DIAGNOSIS — L821 Other seborrheic keratosis: Secondary | ICD-10-CM | POA: Diagnosis not present

## 2023-06-15 DIAGNOSIS — L820 Inflamed seborrheic keratosis: Secondary | ICD-10-CM | POA: Diagnosis not present

## 2023-06-15 DIAGNOSIS — L578 Other skin changes due to chronic exposure to nonionizing radiation: Secondary | ICD-10-CM | POA: Diagnosis not present

## 2023-06-15 DIAGNOSIS — L538 Other specified erythematous conditions: Secondary | ICD-10-CM | POA: Diagnosis not present

## 2023-06-29 ENCOUNTER — Other Ambulatory Visit: Payer: Self-pay | Admitting: Family Medicine

## 2023-06-30 ENCOUNTER — Other Ambulatory Visit (INDEPENDENT_AMBULATORY_CARE_PROVIDER_SITE_OTHER): Payer: PPO

## 2023-06-30 ENCOUNTER — Ambulatory Visit: Payer: PPO | Admitting: Physician Assistant

## 2023-06-30 ENCOUNTER — Encounter: Payer: Self-pay | Admitting: Physician Assistant

## 2023-06-30 DIAGNOSIS — G8929 Other chronic pain: Secondary | ICD-10-CM

## 2023-06-30 DIAGNOSIS — M25551 Pain in right hip: Secondary | ICD-10-CM

## 2023-06-30 DIAGNOSIS — M5441 Lumbago with sciatica, right side: Secondary | ICD-10-CM | POA: Diagnosis not present

## 2023-06-30 NOTE — Progress Notes (Signed)
Office Visit Note   Patient: Jim Mathis.           Date of Birth: [birth year]           MRN: [id number] Visit Date: 06/30/2023              Requested by: Ardith Dark, MD [address] [place],  [place] [address] PCP: Ardith Dark, MD   Assessment & Plan: Visit Diagnoses:  1. Chronic right-sided low back pain with right-sided sciatica   2. Pain of right hip     Plan: Given the fact he did have some BX short period of time where he had no radicular symptoms after having the epidural steroid injection in his lumbar spine recommend follow-up with Dr. Wynetta Emery.  Questions were encouraged and answered at length.  If he develops any hip or groin pain he can always follow-up with [place] as needed.  Questions were encouraged and answered at length today.  Follow-Up Instructions: No follow-ups on file.   Orders:  Orders Placed This Encounter  Procedures   XR Lumbar Spine 2-3 Views   XR HIP UNILAT W OR W/O PELVIS 2-3 VIEWS RIGHT   No orders of the defined types were placed in this encounter.     Procedures: No procedures performed   Clinical Data: No additional findings.   Subjective: Chief Complaint  Patient presents with   Right Hip - Pain   Lower Back - Pain    HPI Patient 72 year old male comes in today due to gait concerns and having numbness in his foot ,along with foot and calf pain once he walks greater than 1 mile.  He states the pain gets better with rest.  He has been seen by Dr. Wynetta Emery here in town and was sent appropriately for ABIs 03/28/2023 these were read as the right resting ankle brachial index indicated mild right lower extremity arterial disease.  Right toe brachial index is abnormal.  Left brachial index mild left lower extremity arterial disease.  Left toe brachial index abnormal. MRI of her spine was performed in April of this year and showed multifactorial mild spinal stenosis at L2-3 and L5-S1.  Also mild foraminal narrowing bilaterally at L2-L3 through  L5-S1.  He reports that he underwent a lumbar epidural steroid injection through Dr. Lonie Peak office and received almost complete relief of his radicular symptoms of the right leg with walking for a week but then the pain came back.  He does take prednisone every day due to his polymyalgia rheumatica.  He also takes gabapentin for nerve pain. History of right total knee arthroplasty and a left partial knee replacement both doing well.  He denies any groin pain bilaterally.  He states he has some stiffness when putting on his shoes and socks on the left side only.  He states he has no problems putting on shoes or socks on the right side.  Review of Systems   Objective: Vital Signs: There were no vitals taken for this visit.  Physical Exam Constitutional:      Appearance: He is not ill-appearing or diaphoretic.  Pulmonary:     Effort: Pulmonary effort is normal.  Neurological:     Mental Status: He is alert and oriented to person, place, and time.  Psychiatric:        Mood and Affect: Mood normal.        Behavior: Behavior normal.     Ortho Exam Lumbar spine: Negative straight leg raise bilaterally.  Tight  hamstrings bilaterally.  Slightly limited flexion of the lumbar spine coming within 2 inches of touching his toes.  Also slightly limited extension lumbar spine without pain. Lower extremities: Good range of motion bilateral hips without pain.  Nontender over the trochanteric region both hips.  Out of 5 strength throughout the lower extremities against resistance.  Dorsal pedal pulses are 2+ and equal and symmetric.  Subjective sensation bilateral feet to light touch is normal. Bilateral knees good range of motion without pain.  No instability valgus varus stressing of either knee.  Surgical incisions are healed well.  Calfs are supple and nontender bilaterally. Bilateral feet he has cavus type foot bilaterally.  5 out of 5 strength with inversion eversion bilateral feet against resistance.   Nontender over the posterior tibial tendons peroneal tendons bilaterally. Specialty Comments:  No specialty comments available.  Imaging: XR HIP UNILAT W OR W/O PELVIS 2-3 VIEWS RIGHT  Result Date: 06/30/2023 AP pelvis lateral view right hip: Bilateral hips well located.  No acute fracture or acute findings.  Left hip is well-preserved.  Right hip with moderate narrowing.  Otherwise no bony lesions or acute findings.  XR Lumbar Spine 2-3 Views  Result Date: 06/30/2023 Lumbar spine 2 views: No acute fractures or acute findings.  Facet arthritic changes lower lumbar spine.  Disc space well-maintained.  Slight grade 1 anterior listhesis L5 on S1.    PMFS History: Patient Active Problem List   Diagnosis Date Noted   Neuropathic pain 03/23/2023   Polymyalgia rheumatica (HCC) 09/07/2022   Paresthesias 07/19/2022   SBO (small bowel obstruction) (HCC) 05/08/2021   PAD (peripheral artery disease) (HCC)    Actinic keratosis 05/06/2020   Nonintractable headache 08/22/2019   BPH with ED and OAB 05/24/2019   Essential hypertension 05/24/2019   Dyslipidemia 05/24/2019   Anxiety 05/24/2019   Seborrheic dermatitis 05/24/2019   Osteoarthritis 07/13/2012   Past Medical History:  Diagnosis Date   BPH with obstruction/lower urinary tract symptoms    Carotid stenosis, right    per pt was told he has some stenosis but not enough for surgery;   in epic under media ,  carotid ultrasound result right ICA 50-69% and incidental finding thyroid nodule   Cervicalgia    ED (erectile dysfunction)    History of colon resection    per pt 12 hours after partial knee arthroplasty 2017,  s/p partial colectomy was possible from divertiulitis   Hyperlipidemia    Hypertension    Migraines    PAD (peripheral artery disease) (HCC) 08-14-2019  per pt when he walks (walks 2 miles a day) right foot gets numb but recovers quickly and when he rides his bike left hand gets numb by recovers quickly ;   denies  claudication or swelling   per pt s/p  stenting to bilateral lower legs in [place] one in 2003 and the other 2007,  no follow up since moved to [place]   Wears glasses     Family History  Problem Relation Age of Onset   Alzheimer's disease Mother    Lung cancer Father        non smoker   COPD Sister    Heart failure Sister    ALS Brother    Colon cancer Neg Hx    Esophageal cancer Neg Hx    Pancreatic cancer Neg Hx    Stomach cancer Neg Hx    Liver disease Neg Hx     Past Surgical History:  Procedure Laterality Date  COLOSTOMY REVERSAL  02-21-2017     [place], [place] (OR record scanned in epic)   w/ sigmoid and descending colectomy with appendectomy   CYSTOSCOPY WITH INJECTION N/A 12/19/2019   Procedure: CYSTOSCOPY WITH INJECTION;  Surgeon: Sebastian Ache, MD;  Location[facility];  Service: Urology;  Laterality: N/A;   CYSTOSCOPY WITH URETHRAL DILATATION N/A 12/19/2019   Procedure: CYSTOSCOPY WITH URETHRAL DILATATION;  Surgeon: Sebastian Ache, MD;  Location: [facility];  Service: Urology;  Laterality: N/A;  45 MINS   ENDOVASCULAR STENT INSERTION  2003 and 2007  --- both done in [place]   bilateral legs   PARTIAL COLECTOMY  09-04-2016  in [place], [place]   w/  colostomy for perferated divierticulitis   PARTIAL KNEE ARTHROPLASTY Left 2017   PILONIDAL CYST EXCISION     TOTAL KNEE ARTHROPLASTY Right 2012   TRANSURETHRAL RESECTION OF PROSTATE N/A 08/15/2019   Procedure: TRANSURETHRAL RESECTION OF THE PROSTATE (TURP);  Surgeon: Sebastian Ache, MD;  Location: [facility];  Service: Urology;  Laterality: N/A;   Social History   Occupational History   Not on file  Tobacco Use   Smoking status: Former    Current packs/day: 0.00    Types: Cigarettes    Start date: 05/23/1974    Quit date: 05/23/1994    Years since quitting: 29.1   Smokeless tobacco: Never  Vaping Use   Vaping status: Never Used  Substance and Sexual Activity   Alcohol use:  Yes    Comment: occasionally   Drug use: Never   Sexual activity: Yes

## 2023-07-04 ENCOUNTER — Other Ambulatory Visit: Payer: Self-pay | Admitting: *Deleted

## 2023-07-11 ENCOUNTER — Ambulatory Visit (AMBULATORY_SURGERY_CENTER): Payer: PPO | Admitting: Gastroenterology

## 2023-07-11 ENCOUNTER — Encounter: Payer: Self-pay | Admitting: Gastroenterology

## 2023-07-11 VITALS — BP 138/77 | HR 69 | Temp 98.0°F | Resp 18 | Ht 70.0 in | Wt 191.0 lb

## 2023-07-11 DIAGNOSIS — D128 Benign neoplasm of rectum: Secondary | ICD-10-CM

## 2023-07-11 DIAGNOSIS — K529 Noninfective gastroenteritis and colitis, unspecified: Secondary | ICD-10-CM

## 2023-07-11 DIAGNOSIS — K5730 Diverticulosis of large intestine without perforation or abscess without bleeding: Secondary | ICD-10-CM

## 2023-07-11 DIAGNOSIS — D123 Benign neoplasm of transverse colon: Secondary | ICD-10-CM

## 2023-07-11 DIAGNOSIS — D122 Benign neoplasm of ascending colon: Secondary | ICD-10-CM

## 2023-07-11 DIAGNOSIS — Z86010 Personal history of colonic polyps: Secondary | ICD-10-CM

## 2023-07-11 DIAGNOSIS — I10 Essential (primary) hypertension: Secondary | ICD-10-CM | POA: Diagnosis not present

## 2023-07-11 DIAGNOSIS — Z09 Encounter for follow-up examination after completed treatment for conditions other than malignant neoplasm: Secondary | ICD-10-CM | POA: Diagnosis not present

## 2023-07-11 DIAGNOSIS — K641 Second degree hemorrhoids: Secondary | ICD-10-CM | POA: Diagnosis not present

## 2023-07-11 DIAGNOSIS — R194 Change in bowel habit: Secondary | ICD-10-CM

## 2023-07-11 MED ORDER — SODIUM CHLORIDE 0.9 % IV SOLN: 500.0000 mL | INTRAVENOUS | Status: DC

## 2023-07-11 NOTE — Patient Instructions (Addendum)
Resume all of your regular medications as ordered.  Read all of the handouts given to you by your recovery room nurse.  Resume all of your previous medications as ordered.  Read all of the handouts given to you y your recovery room nurse.  YOU HAD AN ENDOSCOPIC PROCEDURE TODAY AT THE [facility]:   Refer to the procedure report that was given to you for any specific questions about what was found during the examination.  If the procedure report does not answer your questions, please call your gastroenterologist to clarify.  If you requested that your care partner not be given the details of your procedure findings, then the procedure report has been included in a sealed envelope for you to review at your convenience later.  YOU SHOULD EXPECT: Some feelings of bloating in the abdomen. Passage of more gas than usual.  Walking can help get rid of the air that was put into your GI tract during the procedure and reduce the bloating. If you had a lower endoscopy (such as a colonoscopy or flexible sigmoidoscopy) you may notice spotting of blood in your stool or on the toilet paper. If you underwent a bowel prep for your procedure, you may not have a normal bowel movement for a few days.  Please Note:  You might notice some irritation and congestion in your nose or some drainage.  This is from the oxygen used during your procedure.  There is no need for concern and it should clear up in a day or so.  SYMPTOMS TO REPORT IMMEDIATELY:  Following lower endoscopy (colonoscopy or flexible sigmoidoscopy):  Excessive amounts of blood in the stool  Significant tenderness or worsening of abdominal pains  Swelling of the abdomen that is new, acute  Fever of 100F or higher   For urgent or emergent issues, a gastroenterologist can be reached at any hour by calling [phone]. Do not use MyChart messaging for urgent concerns.    DIET:  We do recommend a small meal at first, but then you may  proceed to your regular diet.  Drink plenty of fluids but you should avoid alcoholic beverages for 24 hours. Increase the fiber in your diet: citrucel, fibercon, metamucil or benefiber.  Drink plenty of water.  Don't get constipated. ACTIVITY:  You should plan to take it easy for the rest of today and you should NOT DRIVE or use heavy machinery until tomorrow (because of the sedation medicines used during the test).    FOLLOW UP: Our staff will call the number listed on your records the next business day following your procedure.  We will call around 7:15- 8:00 am to check on you and address any questions or concerns that you may have regarding the information given to you following your procedure. If we do not reach you, we will leave a message.     If any biopsies were taken you will be contacted by phone or by letter within the next 1-3 weeks.  Please call us at [phone] if you have not heard about the biopsies in 3 weeks.    SIGNATURES/CONFIDENTIALITY: You and/or your care partner have signed paperwork which will be entered into your electronic medical record.  These signatures attest to the fact that that the information above on your After Visit Summary has been reviewed and is understood.  Full responsibility of the confidentiality of this discharge information lies with you and/or your care-partner.

## 2023-07-11 NOTE — Progress Notes (Signed)
Pt awake, alert and oriented. VSS. Airway intact. SBAR complete to RN. All questions answered.  

## 2023-07-11 NOTE — Op Note (Signed)
[facility] Patient Name: Jim Mathis Procedure Date: 07/11/2023 10:42 AM MRN: [id number] Endoscopist: Doristine Locks , MD, [id number] Age: 72 Referring MD:  Date of Birth: Nov 30, 1951 Gender: Male Account #: [id number] Procedure:                Colonoscopy Indications:              High risk colon cancer surveillance: Personal                            history of colonic polyps                           History of perforated diverticulitis requiring                            segmental resection in 2017 and reanastamosis in                            2018. Last colonoscopy was in [place] in 01/2017                            (prior to reanastamosis) and fair/good prep,                            diverticulosis, and recommended repeat in 5 years.                            History of colon polyps prior to that.                           Separately, history of changes in bowel habits with                            increased stool frequency. Medicines:                Monitored Anesthesia Care Procedure:                Pre-Anesthesia Assessment:                           - Prior to the procedure, a History and Physical                            was performed, and patient medications and                            allergies were reviewed. The patient's tolerance of                            previous anesthesia was also reviewed. The risks                            and benefits of the procedure and the sedation  options and risks were discussed with the patient.                            All questions were answered, and informed consent                            was obtained. Prior Anticoagulants: The patient has                            taken no anticoagulant or antiplatelet agents. ASA                            Grade Assessment: II - A patient with mild systemic                            disease. After reviewing the risks and benefits,                             the patient was deemed in satisfactory condition to                            undergo the procedure.                           After obtaining informed consent, the colonoscope                            was passed under direct vision. Throughout the                            procedure, the patient's blood pressure, pulse, and                            oxygen saturations were monitored continuously. The                            CF [id number] #[id number] was introduced through the anus                            and advanced to the the cecum, identified by                            appendiceal orifice and ileocecal valve. The                            colonoscopy was performed without difficulty. The                            patient tolerated the procedure well. The quality                            of the bowel preparation was good. The ileocecal  valve, appendiceal orifice, and rectum were                            photographed. Scope In: 10:54:45 AM Scope Out: 11:11:16 AM Scope Withdrawal Time: 0 hours 14 minutes 9 seconds  Total Procedure Duration: 0 hours 16 minutes 31 seconds  Findings:                 The perianal and digital rectal examinations were                            normal.                           Three sessile polyps were found in the transverse                            colon and ascending colon. The polyps were 3 to 5                            mm in size. These polyps were removed with a cold                            snare. Resection and retrieval were complete.                            Estimated blood loss was minimal.                           There was evidence of a prior end-to-side                            colo-colonic anastomosis in the recto-sigmoid                            colon. This was patent and was characterized by                            healthy appearing mucosa. The anastomosis was                             traversed.                           A 4 mm polyp was found in the recto-sigmoid colon,                            located immediately distal to the anastamosis. The                            polyp was sessile. The polyp was removed with a                            cold snare. Resection and retrieval were complete.  Estimated blood loss was minimal.                           Multiple medium-mouthed and small-mouthed                            diverticula were found in the entire colon.                           Normal mucosa was found in the entire colon.                            Biopsies for histology were taken with a cold                            forceps from the right colon and left colon for                            evaluation of microscopic colitis. Biopsies were                            taken with a cold forceps for histology. Estimated                            blood loss was minimal.                           Non-bleeding internal hemorrhoids were found during                            retroflexion. The hemorrhoids were small. Complications:            No immediate complications. Estimated Blood Loss:     Estimated blood loss was minimal. Impression:               - Three 3 to 5 mm polyps in the transverse colon                            and in the ascending colon, removed with a cold                            snare. Resected and retrieved.                           - Patent end-to-side colo-colonic anastomosis,                            characterized by healthy appearing mucosa.                           - One 4 mm polyp at the recto-sigmoid colon,                            removed with a cold snare. Resected and retrieved.                           -  Diverticulosis in the entire examined colon.                           - Normal mucosa in the entire examined colon.                            Biopsied.                            - Non-bleeding internal hemorrhoids. Recommendation:           - Patient has a contact number available for                            emergencies. The signs and symptoms of potential                            delayed complications were discussed with the                            patient. Return to normal activities tomorrow.                            Written discharge instructions were provided to the                            patient.                           - Resume previous diet.                           - Continue present medications.                           - Await pathology results.                           - Repeat colonoscopy for surveillance based on                            pathology results.                           - Return to GI clinic PRN.                           - Use fiber, for example Citrucel, Fibercon, Konsyl                            or Metamucil. Doristine Locks, MD 07/11/2023 11:20:13 AM

## 2023-07-11 NOTE — Progress Notes (Signed)
Patient states there have been no changes to medical or surgical history since time of pre-visit. 

## 2023-07-11 NOTE — Progress Notes (Signed)
GASTROENTEROLOGY PROCEDURE H&P NOTE   Primary Care Physician: Ardith Dark, MD    Reason for Procedure:   Change in bowel habits, change in stool frequency, history of diverticulitis s/p surgical resection  Plan:    Colonoscopy  Patient is appropriate for endoscopic procedure(s) in the ambulatory ([facility]) setting.  The nature of the procedure, as well as the risks, benefits, and alternatives were carefully and thoroughly reviewed with the patient. Ample time for discussion and questions allowed. The patient understood, was satisfied, and agreed to proceed.     HPI: Jim Mathis. is a 72 y.o. male who presents for colonoscopy for evaluation of change in bowel habits, described as increased stool frequency and bloating. Hx of perforated diverticulitis requiring segmental resection in 2017 and reanastamosis in 2018. Last colonoscopy was in [place] in 01/2017 (prior to reanastamosis) and fair/good prep, diverticulosis, and recommended repeat in 5 years. Possibly hx of colon polyps prior to that.   Past Medical History:  Diagnosis Date   BPH with obstruction/lower urinary tract symptoms    Carotid stenosis, right    per pt was told he has some stenosis but not enough for surgery;   in epic under media ,  carotid ultrasound result right ICA 50-69% and incidental finding thyroid nodule   Cervicalgia    ED (erectile dysfunction)    History of colon resection    per pt 12 hours after partial knee arthroplasty 2017,  s/p partial colectomy was possible from divertiulitis   Hyperlipidemia    Hypertension    Migraines    PAD (peripheral artery disease) (HCC) 08-14-2019  per pt when he walks (walks 2 miles a day) right foot gets numb but recovers quickly and when he rides his bike left hand gets numb by recovers quickly ;   denies claudication or swelling   per pt s/p  stenting to bilateral lower legs in [place] one in 2003 and the other 2007,  no follow up since moved to [facility]   Wears glasses      Past Surgical History:  Procedure Laterality Date   COLOSTOMY REVERSAL  02-21-2017     [place], [place] (OR record scanned in epic)   w/ sigmoid and descending colectomy with appendectomy   CYSTOSCOPY WITH INJECTION N/A 12/19/2019   Procedure: CYSTOSCOPY WITH INJECTION;  Surgeon: Jim Ache, MD;  Location: [facility];  Service: Urology;  Laterality: N/A;   CYSTOSCOPY WITH URETHRAL DILATATION N/A 12/19/2019   Procedure: CYSTOSCOPY WITH URETHRAL DILATATION;  Surgeon: Jim Ache, MD;  Location: [facility];  Service: Urology;  Laterality: N/A;  45 MINS   ENDOVASCULAR STENT INSERTION  2003 and 2007  --- both done in [place]   bilateral legs   PARTIAL COLECTOMY  09-04-2016  in [place], [place]   w/  colostomy for perferated divierticulitis   PARTIAL KNEE ARTHROPLASTY Left 2017   PILONIDAL CYST EXCISION     TOTAL KNEE ARTHROPLASTY Right 2012   TRANSURETHRAL RESECTION OF PROSTATE N/A 08/15/2019   Procedure: TRANSURETHRAL RESECTION OF THE PROSTATE (TURP);  Surgeon: Jim Ache, MD;  Location: [facility];  Service: Urology;  Laterality: N/A;    Prior to Admission medications   Medication Sig Start Date End Date Taking? Authorizing Provider  amLODipine (NORVASC) 10 MG tablet Take 1 tablet by mouth daily 06/29/23   Ardith Dark, MD  atorvastatin (LIPITOR) 40 MG tablet Take 1 tablet by mouth daily 08/13/22   Jarold Motto, PA  doxepin [facility])  25 MG capsule Take 1 capsule (25 mg total) by mouth daily. 04/21/23   Ardith Dark, MD  finasteride (PROSCAR) 5 MG tablet Take 1 tablet (5 mg total) by mouth at bedtime. 05/06/20   Ardith Dark, MD  gabapentin (NEURONTIN) 300 MG capsule Take 300 mg by mouth at bedtime.    [provider]  predniSONE (STERAPRED UNI-PAK 48 TAB) 10 MG (48) TBPK tablet Take by mouth daily. 12 day dosepack po 08/19/22   Rodolph Bong, MD  tadalafil (CIALIS) 20 MG tablet TAKE ONE TABLET BY MOUTH DAILY 05/30/23    Ardith Dark, MD  triamcinolone ointment (KENALOG) 0.5 % APPLY TOPICALLY TO AFFECTED AREA(S) THREE TIMES A DAY Patient taking differently: As needed 01/10/23   Ardith Dark, MD    Current Outpatient Medications  Medication Sig Dispense Refill   amLODipine (NORVASC) 10 MG tablet Take 1 tablet by mouth daily 90 tablet 0   atorvastatin (LIPITOR) 40 MG tablet Take 1 tablet by mouth daily 90 tablet 3   doxepin (SINEQUAN) 25 MG capsule Take 1 capsule (25 mg total) by mouth daily. 90 capsule 1   finasteride (PROSCAR) 5 MG tablet Take 1 tablet (5 mg total) by mouth at bedtime. 90 tablet 3   gabapentin (NEURONTIN) 300 MG capsule Take 300 mg by mouth at bedtime.     predniSONE (STERAPRED UNI-PAK 48 TAB) 10 MG (48) TBPK tablet Take by mouth daily. 12 day dosepack po 48 tablet 0   tadalafil (CIALIS) 20 MG tablet TAKE ONE TABLET BY MOUTH DAILY 90 tablet 3   triamcinolone ointment (KENALOG) 0.5 % APPLY TOPICALLY TO AFFECTED AREA(S) THREE TIMES A DAY (Patient taking differently: As needed) 30 g 3   No current facility-administered medications for this visit.    Allergies as of 07/11/2023 - Review Complete 06/30/2023  Allergen Reaction Noted   Pollen extract Cough, Itching, and Other (See Comments) 01/20/2022    Family History  Problem Relation Age of Onset   Alzheimer's disease Mother    Lung cancer Father        non smoker   COPD Sister    Heart failure Sister    ALS Brother    Colon cancer Neg Hx    Esophageal cancer Neg Hx    Pancreatic cancer Neg Hx    Stomach cancer Neg Hx    Liver disease Neg Hx     Social History   Socioeconomic History   Marital status: Married    Spouse name: Not on file   Number of children: Not on file   Years of education: Not on file   Highest education level: Master's degree (e.g., MA, MS, MEng, MEd, MSW, MBA)  Occupational History   Not on file  Tobacco Use   Smoking status: Former    Current packs/day: 0.00    Types: Cigarettes    Start  date: 05/23/1974    Quit date: 05/23/1994    Years since quitting: 29.1   Smokeless tobacco: Never  Vaping Use   Vaping status: Never Used  Substance and Sexual Activity   Alcohol use: Yes    Comment: occasionally   Drug use: Never   Sexual activity: Yes  Other Topics Concern   Not on file  Social History Narrative   Right handed   Lives in two story home with wife   Social Determinants of Health   Financial Resource Strain: Low Risk  (05/05/2023)   Overall Financial Resource Strain (CARDIA)  Difficulty of Paying Living Expenses: Not hard at all  Food Insecurity: No Food Insecurity (05/05/2023)   Hunger Vital Sign    Worried About Running Out of Food in the Last Year: Never true    Ran Out of Food in the Last Year: Never true  Transportation Needs: No Transportation Needs (05/05/2023)   PRAPARE - Administrator, Civil Service (Medical): No    Lack of Transportation (Non-Medical): No  Physical Activity: Sufficiently Active (05/05/2023)   Exercise Vital Sign    Days of Exercise per Week: 3 days    Minutes of Exercise per Session: 60 min  Stress: Stress Concern Present (05/05/2023)   [employer] of Occupational Health - Occupational Stress Questionnaire    Feeling of Stress : To some extent  Social Connections: Moderately Integrated (05/05/2023)   Social Connection and Isolation Panel [NHANES]    Frequency of Communication with Friends and Family: Three times a week    Frequency of Social Gatherings with Friends and Family: Once a week    Attends Religious Services: More than 4 times per year    Active Member of [employer] or Organizations: No    Attends Banker Meetings: Never    Marital Status: Married  Catering manager Violence: Not At Risk (05/09/2023)   Humiliation, Afraid, Rape, and Kick questionnaire    Fear of Current or Ex-Partner: No    Emotionally Abused: No    Physically Abused: No    Sexually Abused: No    Physical Exam: Vital signs in  last 24 hours: @There  were no vitals taken for this visit. GEN: NAD EYE: Sclerae anicteric ENT: MMM CV: Non-tachycardic Pulm: CTA b/l GI: Soft, NT/ND NEURO:  Alert & Oriented x 3   Doristine Locks, DO  Gastroenterology   07/11/2023 10:09 AM

## 2023-07-11 NOTE — Progress Notes (Signed)
Called to room to assist during endoscopic procedure.  Patient ID and intended procedure confirmed with present staff. Received instructions for my participation in the procedure from the performing physician.  

## 2023-07-12 ENCOUNTER — Telehealth: Payer: Self-pay

## 2023-07-12 DIAGNOSIS — H57813 Brow ptosis, bilateral: Secondary | ICD-10-CM | POA: Diagnosis not present

## 2023-07-12 DIAGNOSIS — H16223 Keratoconjunctivitis sicca, not specified as Sjogren's, bilateral: Secondary | ICD-10-CM | POA: Diagnosis not present

## 2023-07-12 DIAGNOSIS — H02839 Dermatochalasis of unspecified eye, unspecified eyelid: Secondary | ICD-10-CM | POA: Diagnosis not present

## 2023-07-12 DIAGNOSIS — H26493 Other secondary cataract, bilateral: Secondary | ICD-10-CM | POA: Diagnosis not present

## 2023-07-12 NOTE — Telephone Encounter (Signed)
  Follow up Call-     07/11/2023   10:19 AM  Call back number  Post procedure Call Back phone  # [phone]  Permission to leave phone message Yes     Patient questions:  Do you have a fever, pain , or abdominal swelling? No. Pain Score  0 *  Have you tolerated food without any problems? Yes.    Have you been able to return to your normal activities? Yes.    Do you have any questions about your discharge instructions: Diet   No. Medications  No. Follow up visit  No.  Do you have questions or concerns about your Care? No.  Actions: * If pain score is 4 or above: No action needed, pain <4.

## 2023-07-18 DIAGNOSIS — M5416 Radiculopathy, lumbar region: Secondary | ICD-10-CM | POA: Diagnosis not present

## 2023-07-28 ENCOUNTER — Encounter: Payer: Self-pay | Admitting: Family Medicine

## 2023-07-29 ENCOUNTER — Other Ambulatory Visit: Payer: Self-pay | Admitting: *Deleted

## 2023-07-29 MED ORDER — TADALAFIL 20 MG PO TABS: 20.0000 mg | ORAL_TABLET | Freq: Every day | ORAL | 3 refills | Status: DC

## 2023-07-29 NOTE — Telephone Encounter (Signed)
New Rx send in  Waiting for PA form from pharmacy

## 2023-08-04 DIAGNOSIS — M542 Cervicalgia: Secondary | ICD-10-CM | POA: Diagnosis not present

## 2023-08-04 DIAGNOSIS — M5416 Radiculopathy, lumbar region: Secondary | ICD-10-CM | POA: Diagnosis not present

## 2023-08-04 DIAGNOSIS — M353 Polymyalgia rheumatica: Secondary | ICD-10-CM | POA: Diagnosis not present

## 2023-08-04 DIAGNOSIS — M419 Scoliosis, unspecified: Secondary | ICD-10-CM | POA: Diagnosis not present

## 2023-08-04 DIAGNOSIS — M7552 Bursitis of left shoulder: Secondary | ICD-10-CM | POA: Diagnosis not present

## 2023-08-04 DIAGNOSIS — G5600 Carpal tunnel syndrome, unspecified upper limb: Secondary | ICD-10-CM | POA: Diagnosis not present

## 2023-08-04 DIAGNOSIS — M0609 Rheumatoid arthritis without rheumatoid factor, multiple sites: Secondary | ICD-10-CM | POA: Diagnosis not present

## 2023-08-05 NOTE — Telephone Encounter (Signed)
See note

## 2023-08-08 NOTE — Telephone Encounter (Signed)
Ok to proceed with appeal or another prior authorization if needed. He is taking tadalafil for his BPH.  Katina Degree. Jimmey Ralph, MD 08/08/2023 7:55 AM

## 2023-08-11 ENCOUNTER — Other Ambulatory Visit: Payer: Self-pay

## 2023-08-11 ENCOUNTER — Telehealth: Payer: Self-pay

## 2023-08-11 ENCOUNTER — Other Ambulatory Visit (HOSPITAL_COMMUNITY): Payer: Self-pay

## 2023-08-11 NOTE — Telephone Encounter (Signed)
*  Primary  Pharmacy Patient Advocate Encounter   Received notification from Fax that prior authorization for Tadalafil 20MG  tablets  is required/requested.   Insurance verification completed.   The patient is insured through [employer]/ [employer] Advance .   Per test claim: PA required; PA submitted to [employer]/ [employer] via CoverMyMeds Key/confirmation #/EOC [id number] Status is pending

## 2023-08-12 NOTE — Telephone Encounter (Signed)
Pharmacy Patient Advocate Encounter  Received notification from [employer]/ [employer] that Prior Authorization for Tadalafil has been DENIED. Please advise how you'd like to proceed. Full denial letter will be uploaded to the media tab. See denial reason below.   *Appeal has been denied, full denial in patients media.

## 2023-08-15 ENCOUNTER — Other Ambulatory Visit (HOSPITAL_COMMUNITY): Payer: Self-pay

## 2023-09-01 DIAGNOSIS — H02839 Dermatochalasis of unspecified eye, unspecified eyelid: Secondary | ICD-10-CM | POA: Diagnosis not present

## 2023-09-01 DIAGNOSIS — H16223 Keratoconjunctivitis sicca, not specified as Sjogren's, bilateral: Secondary | ICD-10-CM | POA: Diagnosis not present

## 2023-09-01 DIAGNOSIS — H0288A Meibomian gland dysfunction right eye, upper and lower eyelids: Secondary | ICD-10-CM | POA: Diagnosis not present

## 2023-09-01 DIAGNOSIS — H26493 Other secondary cataract, bilateral: Secondary | ICD-10-CM | POA: Diagnosis not present

## 2023-09-01 DIAGNOSIS — H0288B Meibomian gland dysfunction left eye, upper and lower eyelids: Secondary | ICD-10-CM | POA: Diagnosis not present

## 2023-09-01 DIAGNOSIS — H57813 Brow ptosis, bilateral: Secondary | ICD-10-CM | POA: Diagnosis not present

## 2023-09-07 ENCOUNTER — Other Ambulatory Visit: Payer: Self-pay | Admitting: *Deleted

## 2023-09-07 MED ORDER — AMLODIPINE BESYLATE 10 MG PO TABS: 10.0000 mg | ORAL_TABLET | Freq: Every day | ORAL | 0 refills | Status: DC

## 2023-09-07 MED ORDER — DOXEPIN HCL 25 MG PO CAPS: 25.0000 mg | ORAL_CAPSULE | Freq: Every day | ORAL | 1 refills | Status: DC

## 2023-09-07 MED ORDER — ATORVASTATIN CALCIUM 40 MG PO TABS: 40.0000 mg | ORAL_TABLET | Freq: Every day | ORAL | 3 refills | Status: DC

## 2023-09-07 MED ORDER — TADALAFIL 20 MG PO TABS: 20.0000 mg | ORAL_TABLET | Freq: Every day | ORAL | 3 refills | Status: DC

## 2023-09-07 MED ORDER — FINASTERIDE 5 MG PO TABS: 5.0000 mg | ORAL_TABLET | Freq: Every day | ORAL | 3 refills | Status: AC

## 2023-09-22 DIAGNOSIS — H26493 Other secondary cataract, bilateral: Secondary | ICD-10-CM | POA: Diagnosis not present

## 2023-09-27 ENCOUNTER — Ambulatory Visit (INDEPENDENT_AMBULATORY_CARE_PROVIDER_SITE_OTHER): Payer: PPO | Admitting: Family Medicine

## 2023-09-27 ENCOUNTER — Encounter: Payer: Self-pay | Admitting: Family Medicine

## 2023-09-27 VITALS — BP 136/76 | HR 80 | Temp 97.8°F | Ht 70.0 in | Wt 189.2 lb

## 2023-09-27 DIAGNOSIS — M353 Polymyalgia rheumatica: Secondary | ICD-10-CM | POA: Diagnosis not present

## 2023-09-27 DIAGNOSIS — M792 Neuralgia and neuritis, unspecified: Secondary | ICD-10-CM

## 2023-09-27 DIAGNOSIS — N400 Enlarged prostate without lower urinary tract symptoms: Secondary | ICD-10-CM

## 2023-09-27 DIAGNOSIS — R7303 Prediabetes: Secondary | ICD-10-CM | POA: Diagnosis not present

## 2023-09-27 DIAGNOSIS — I10 Essential (primary) hypertension: Secondary | ICD-10-CM | POA: Diagnosis not present

## 2023-09-27 LAB — POCT GLYCOSYLATED HEMOGLOBIN (HGB A1C): Hemoglobin A1C: 5.5 % (ref 4.0–5.6)

## 2023-09-27 LAB — VITAMIN B12: Vitamin B-12: 602 pg/mL (ref 211–911)

## 2023-09-27 MED ORDER — TADALAFIL 20 MG PO TABS: 20.0000 mg | ORAL_TABLET | Freq: Every day | ORAL | 3 refills | Status: DC

## 2023-09-27 NOTE — Assessment & Plan Note (Addendum)
Initially elevated today but at goal on recheck.  Continue amlodipine 10 mg

## 2023-09-27 NOTE — Assessment & Plan Note (Signed)
Following with rheumatology.  He is weaning down prednisone.  Symptoms are stable.

## 2023-09-27 NOTE — Assessment & Plan Note (Signed)
Overall symptoms are stable on tadalafil 20 mg daily and finasteride 5 mg daily.  Will refill today.

## 2023-09-27 NOTE — Patient Instructions (Addendum)
It was very nice to see you today!  Your A1c today is 5.5.   Please keep working on the diet and exercise.   We will check your B12 today.   Keep an eye on your blood pressure and let me know if it is persistently elevated.   Return for Annual Physical.   Take care, Dr Jimmey Ralph  PLEASE NOTE:  If you had any lab tests, please let us know if you have not heard back within a few days. You may see your results on mychart before we have a chance to review them but we will give you a call once they are reviewed by [place].   If we ordered any referrals today, please let us know if you have not heard from their office within the next week.   If you had any urgent prescriptions sent in today, please check with the pharmacy within an hour of our visit to make sure the prescription was transmitted appropriately.   Please try these tips to maintain a healthy lifestyle:  Eat at least 3 REAL meals and 1-2 snacks per day.  Aim for no more than 5 hours between eating.  If you eat breakfast, please do so within one hour of getting up.   Each meal should contain half fruits/vegetables, one quarter protein, and one quarter carbs (no bigger than a computer mouse)  Cut down on sweet beverages. This includes juice, soda, and sweet tea.   Drink at least 1 glass of water with each meal and aim for at least 8 glasses per day  Exercise at least 150 minutes every week.

## 2023-09-27 NOTE — Assessment & Plan Note (Signed)
A1c 5.5.  He will continue to work on lifestyle modifications.  We can recheck in 6 to 12 months.

## 2023-09-27 NOTE — Assessment & Plan Note (Signed)
Likely multifactorial.  Reassured patient today that his A1c was at goal and likely not contributing to his neuropathic type pain.  He does have known history of cervical radiculopathy and carpal tunnel syndrome and ulnar neuropathy that are likely contributing.  Also with some lumbar radiculopathy as well.  He is following with orthopedics for this.  He is on gabapentin 300 mg nightly.  Will check B12 to rule out possible deficiencies that could be contributing to his neuropathic pain but will otherwise continue management per his orthopedist.

## 2023-09-27 NOTE — Progress Notes (Signed)
   Algie Westry. is a 72 y.o. male who presents today for an office visit.  Assessment/Plan:  Chronic Problems Addressed Today: Prediabetes A1c 5.5.  He will continue to work on lifestyle modifications.  We can recheck in 6 to 12 months.  Polymyalgia rheumatica (HCC) Following with rheumatology.  He is weaning down prednisone.  Symptoms are stable.  Neuropathic pain Likely multifactorial.  Reassured patient today that his A1c was at goal and likely not contributing to his neuropathic type pain.  He does have known history of cervical radiculopathy and carpal tunnel syndrome and ulnar neuropathy that are likely contributing.  Also with some lumbar radiculopathy as well.  He is following with orthopedics for this.  He is on gabapentin 300 mg nightly.  Will check B12 to rule out possible deficiencies that could be contributing to his neuropathic pain but will otherwise continue management per his orthopedist.  Essential hypertension Initially elevated today but at goal on recheck.  Continue amlodipine 10 mg  BPH with ED and OAB Overall symptoms are stable on tadalafil 20 mg daily and finasteride 5 mg daily.  Will refill today.     Subjective:  HPI:  See Assessment / plan for status of chronic conditions.  He is here today to discuss his blood sugar. Last year his A1c was 6.1. He has been having on going issues with neuropathy and also has a strong family history of diabetes.  He has been working with rheumatology to wean down on prednisone.  He thinks the higher dose was previously causing some issues with high sugar.  He has also had worsening neuropathy symptoms that he thinks may be masked with a higher dose of prednisone.       Objective:  Physical Exam: BP 136/76   Pulse 80   Temp 97.8 F (36.6 C) (Temporal)   Ht 5\' 10"  (1.778 m)   Wt 189 lb 3.2 oz (85.8 kg)   SpO2 98%   BMI 27.15 kg/m   Gen: No acute distress, resting comfortably CV: Regular rate and rhythm with no  murmurs appreciated Pulm: Normal work of breathing, clear to auscultation bilaterally with no crackles, wheezes, or rhonchi Neuro: Grossly normal, moves all extremities Psych: Normal affect and thought content      Adonnis Salceda M. Jimmey Ralph, MD 09/27/2023 10:47 AM

## 2023-09-28 NOTE — Progress Notes (Signed)
His B12 is normal.  This is not contributing to any of his nerve pain.  Recommend he follow back up with orthopedics as previously planned.

## 2023-09-29 DIAGNOSIS — H26491 Other secondary cataract, right eye: Secondary | ICD-10-CM | POA: Diagnosis not present

## 2023-09-29 DIAGNOSIS — H26492 Other secondary cataract, left eye: Secondary | ICD-10-CM | POA: Diagnosis not present

## 2023-10-10 DIAGNOSIS — N35811 Other urethral stricture, male, meatal: Secondary | ICD-10-CM | POA: Diagnosis not present

## 2023-10-10 DIAGNOSIS — N5201 Erectile dysfunction due to arterial insufficiency: Secondary | ICD-10-CM | POA: Diagnosis not present

## 2023-10-10 DIAGNOSIS — R3912 Poor urinary stream: Secondary | ICD-10-CM | POA: Diagnosis not present

## 2023-10-11 DIAGNOSIS — Z6827 Body mass index (BMI) 27.0-27.9, adult: Secondary | ICD-10-CM | POA: Diagnosis not present

## 2023-10-11 DIAGNOSIS — G5603 Carpal tunnel syndrome, bilateral upper limbs: Secondary | ICD-10-CM | POA: Diagnosis not present

## 2023-10-23 DIAGNOSIS — G5601 Carpal tunnel syndrome, right upper limb: Secondary | ICD-10-CM | POA: Diagnosis not present

## 2023-10-24 DIAGNOSIS — M0609 Rheumatoid arthritis without rheumatoid factor, multiple sites: Secondary | ICD-10-CM | POA: Diagnosis not present

## 2023-10-24 DIAGNOSIS — M353 Polymyalgia rheumatica: Secondary | ICD-10-CM | POA: Diagnosis not present

## 2023-10-24 DIAGNOSIS — M419 Scoliosis, unspecified: Secondary | ICD-10-CM | POA: Diagnosis not present

## 2023-10-24 DIAGNOSIS — G5600 Carpal tunnel syndrome, unspecified upper limb: Secondary | ICD-10-CM | POA: Diagnosis not present

## 2023-10-24 DIAGNOSIS — M7552 Bursitis of left shoulder: Secondary | ICD-10-CM | POA: Diagnosis not present

## 2023-10-24 DIAGNOSIS — M542 Cervicalgia: Secondary | ICD-10-CM | POA: Diagnosis not present

## 2023-10-24 DIAGNOSIS — M5416 Radiculopathy, lumbar region: Secondary | ICD-10-CM | POA: Diagnosis not present

## 2023-10-26 LAB — LAB REPORT - SCANNED: EGFR: 92

## 2023-11-03 DIAGNOSIS — Z6826 Body mass index (BMI) 26.0-26.9, adult: Secondary | ICD-10-CM | POA: Diagnosis not present

## 2023-11-03 DIAGNOSIS — G5603 Carpal tunnel syndrome, bilateral upper limbs: Secondary | ICD-10-CM | POA: Diagnosis not present

## 2023-11-09 DIAGNOSIS — G5600 Carpal tunnel syndrome, unspecified upper limb: Secondary | ICD-10-CM | POA: Diagnosis not present

## 2023-11-09 DIAGNOSIS — M353 Polymyalgia rheumatica: Secondary | ICD-10-CM | POA: Diagnosis not present

## 2023-11-09 DIAGNOSIS — M7552 Bursitis of left shoulder: Secondary | ICD-10-CM | POA: Diagnosis not present

## 2023-11-09 DIAGNOSIS — M542 Cervicalgia: Secondary | ICD-10-CM | POA: Diagnosis not present

## 2023-11-09 DIAGNOSIS — M419 Scoliosis, unspecified: Secondary | ICD-10-CM | POA: Diagnosis not present

## 2023-11-09 DIAGNOSIS — M0609 Rheumatoid arthritis without rheumatoid factor, multiple sites: Secondary | ICD-10-CM | POA: Diagnosis not present

## 2023-11-09 DIAGNOSIS — M5416 Radiculopathy, lumbar region: Secondary | ICD-10-CM | POA: Diagnosis not present

## 2023-11-21 DIAGNOSIS — H57813 Brow ptosis, bilateral: Secondary | ICD-10-CM | POA: Diagnosis not present

## 2023-11-21 DIAGNOSIS — H02834 Dermatochalasis of left upper eyelid: Secondary | ICD-10-CM | POA: Diagnosis not present

## 2023-11-21 DIAGNOSIS — H02411 Mechanical ptosis of right eyelid: Secondary | ICD-10-CM | POA: Diagnosis not present

## 2023-11-21 DIAGNOSIS — H02412 Mechanical ptosis of left eyelid: Secondary | ICD-10-CM | POA: Diagnosis not present

## 2023-11-21 DIAGNOSIS — H02831 Dermatochalasis of right upper eyelid: Secondary | ICD-10-CM | POA: Diagnosis not present

## 2023-11-21 DIAGNOSIS — H0279 Other degenerative disorders of eyelid and periocular area: Secondary | ICD-10-CM | POA: Diagnosis not present

## 2023-11-21 DIAGNOSIS — H53483 Generalized contraction of visual field, bilateral: Secondary | ICD-10-CM | POA: Diagnosis not present

## 2023-11-21 DIAGNOSIS — H02413 Mechanical ptosis of bilateral eyelids: Secondary | ICD-10-CM | POA: Diagnosis not present

## 2023-11-21 DIAGNOSIS — Z01818 Encounter for other preprocedural examination: Secondary | ICD-10-CM | POA: Diagnosis not present

## 2023-11-22 ENCOUNTER — Other Ambulatory Visit: Payer: Self-pay | Admitting: Family Medicine

## 2023-11-22 MED ORDER — AMLODIPINE BESYLATE 10 MG PO TABS: 10.0000 mg | ORAL_TABLET | Freq: Every day | ORAL | 0 refills | Status: DC

## 2023-11-28 DIAGNOSIS — H53483 Generalized contraction of visual field, bilateral: Secondary | ICD-10-CM | POA: Diagnosis not present

## 2023-11-30 DIAGNOSIS — M353 Polymyalgia rheumatica: Secondary | ICD-10-CM | POA: Diagnosis not present

## 2023-12-05 ENCOUNTER — Encounter: Payer: Self-pay | Admitting: Family Medicine

## 2023-12-05 ENCOUNTER — Ambulatory Visit (INDEPENDENT_AMBULATORY_CARE_PROVIDER_SITE_OTHER): Payer: PPO | Admitting: Family Medicine

## 2023-12-05 VITALS — BP 136/79 | HR 74 | Temp 97.8°F | Resp 18 | Ht 70.0 in | Wt 190.4 lb

## 2023-12-05 DIAGNOSIS — R202 Paresthesia of skin: Secondary | ICD-10-CM | POA: Diagnosis not present

## 2023-12-05 DIAGNOSIS — E785 Hyperlipidemia, unspecified: Secondary | ICD-10-CM | POA: Diagnosis not present

## 2023-12-05 DIAGNOSIS — I10 Essential (primary) hypertension: Secondary | ICD-10-CM

## 2023-12-05 DIAGNOSIS — Z0001 Encounter for general adult medical examination with abnormal findings: Secondary | ICD-10-CM | POA: Diagnosis not present

## 2023-12-05 DIAGNOSIS — R7303 Prediabetes: Secondary | ICD-10-CM

## 2023-12-05 DIAGNOSIS — M353 Polymyalgia rheumatica: Secondary | ICD-10-CM

## 2023-12-05 DIAGNOSIS — N400 Enlarged prostate without lower urinary tract symptoms: Secondary | ICD-10-CM

## 2023-12-05 LAB — CBC WITH DIFFERENTIAL/PLATELET
Basophils Absolute: 0 10*3/uL (ref 0.0–0.1)
Basophils Relative: 0.3 % (ref 0.0–3.0)
Eosinophils Absolute: 0.1 10*3/uL (ref 0.0–0.7)
Eosinophils Relative: 1.1 % (ref 0.0–5.0)
HCT: 42.7 % (ref 39.0–52.0)
Hemoglobin: 14 g/dL (ref 13.0–17.0)
Lymphocytes Relative: 10.9 % — ABNORMAL LOW (ref 12.0–46.0)
Lymphs Abs: 0.8 10*3/uL (ref 0.7–4.0)
MCHC: 32.8 g/dL (ref 30.0–36.0)
MCV: 93.5 fL (ref 78.0–100.0)
Monocytes Absolute: 0.5 10*3/uL (ref 0.1–1.0)
Monocytes Relative: 7 % (ref 3.0–12.0)
Neutro Abs: 5.9 10*3/uL (ref 1.4–7.7)
Neutrophils Relative %: 80.7 % — ABNORMAL HIGH (ref 43.0–77.0)
Platelets: 229 10*3/uL (ref 150.0–400.0)
RBC: 4.57 Mil/uL (ref 4.22–5.81)
RDW: 14.5 % (ref 11.5–15.5)
WBC: 7.3 10*3/uL (ref 4.0–10.5)

## 2023-12-05 LAB — COMPREHENSIVE METABOLIC PANEL
ALT: 19 U/L (ref 0–53)
AST: 18 U/L (ref 0–37)
Albumin: 4.2 g/dL (ref 3.5–5.2)
Alkaline Phosphatase: 75 U/L (ref 39–117)
BUN: 19 mg/dL (ref 6–23)
CO2: 25 meq/L (ref 19–32)
Calcium: 9.2 mg/dL (ref 8.4–10.5)
Chloride: 105 meq/L (ref 96–112)
Creatinine, Ser: 0.84 mg/dL (ref 0.40–1.50)
GFR: 86.7 mL/min (ref >60.00)
Glucose, Bld: 90 mg/dL (ref 70–99)
Potassium: 4.2 meq/L (ref 3.5–5.1)
Sodium: 138 meq/L (ref 135–145)
Total Bilirubin: 0.4 mg/dL (ref 0.2–1.2)
Total Protein: 6.9 g/dL (ref 6.0–8.3)

## 2023-12-05 LAB — LIPID PANEL
Cholesterol: 196 mg/dL (ref 0–200)
HDL: 73.1 mg/dL (ref >39.00)
LDL Cholesterol: 106 mg/dL — ABNORMAL HIGH (ref 0–99)
NonHDL: 123.08
Total CHOL/HDL Ratio: 3
Triglycerides: 86 mg/dL (ref 0.0–149.0)
VLDL: 17.2 mg/dL (ref 0.0–40.0)

## 2023-12-05 LAB — HEMOGLOBIN A1C: Hgb A1c MFr Bld: 5.9 % (ref 4.6–6.5)

## 2023-12-05 LAB — TSH: TSH: 1.48 u[IU]/mL (ref 0.35–5.50)

## 2023-12-05 NOTE — Assessment & Plan Note (Signed)
Overall symptoms are stable on tadalafil 20 mg daily and finasteride 5 mg daily.  Check PSA.

## 2023-12-05 NOTE — Assessment & Plan Note (Signed)
He has followed with neurosurgery for this.  He has had nerve conduction studies done previously which showed ulnar neuropathy and carpal tunnel syndrome.  His symptoms do typically improve with prednisone however still having quite a few symptoms.  We did discuss referral to see orthopedics or alternative neurologist however he declined for now.  He will let us know if he changes his mind.

## 2023-12-05 NOTE — Assessment & Plan Note (Signed)
Check A1c. 

## 2023-12-05 NOTE — Patient Instructions (Signed)
It was very nice to see you today!  We will check blood work.  Please continue to work on diet and exercise.  Let us know if you need referral to see a different specialist for your nerve issues.  I will see back in a year for your next physical.  Please come back sooner if needed.  Return in about 1 year (around 12/04/2024) for Annual Physical.   Take care, Dr Jimmey Ralph  PLEASE NOTE:  If you had any lab tests, please let us know if you have not heard back within a few days. You may see your results on mychart before we have a chance to review them but we will give you a call once they are reviewed by [place].   If we ordered any referrals today, please let us know if you have not heard from their office within the next week.   If you had any urgent prescriptions sent in today, please check with the pharmacy within an hour of our visit to make sure the prescription was transmitted appropriately.   Please try these tips to maintain a healthy lifestyle:  Eat at least 3 REAL meals and 1-2 snacks per day.  Aim for no more than 5 hours between eating.  If you eat breakfast, please do so within one hour of getting up.   Each meal should contain half fruits/vegetables, one quarter protein, and one quarter carbs (no bigger than a computer mouse)  Cut down on sweet beverages. This includes juice, soda, and sweet tea.   Drink at least 1 glass of water with each meal and aim for at least 8 glasses per day  Exercise at least 150 minutes every week.   Preventive Care 47 Years and Older, Male Preventive care refers to lifestyle choices and visits with your health care provider that can promote health and wellness. Preventive care visits are also called wellness exams. What can I expect for my preventive care visit? Counseling During your preventive care visit, your health care provider may ask about your: Medical history, including: Past medical problems. Family medical history. History of  falls. Current health, including: Emotional well-being. Home life and relationship well-being. Sexual activity. Memory and ability to understand (cognition). Lifestyle, including: Alcohol, nicotine or tobacco, and drug use. Access to firearms. Diet, exercise, and sleep habits. Work and work Astronomer. Sunscreen use. Safety issues such as seatbelt and bike helmet use. Physical exam Your health care provider will check your: Height and weight. These may be used to calculate your BMI (body mass index). BMI is a measurement that tells if you are at a healthy weight. Waist circumference. This measures the distance around your waistline. This measurement also tells if you are at a healthy weight and may help predict your risk of certain diseases, such as type 2 diabetes and high blood pressure. Heart rate and blood pressure. Body temperature. Skin for abnormal spots. What immunizations do I need?  Vaccines are usually given at various ages, according to a schedule. Your health care provider will recommend vaccines for you based on your age, medical history, and lifestyle or other factors, such as travel or where you work. What tests do I need? Screening Your health care provider may recommend screening tests for certain conditions. This may include: Lipid and cholesterol levels. Diabetes screening. This is done by checking your blood sugar (glucose) after you have not eaten for a while (fasting). Hepatitis C test. Hepatitis B test. HIV (human immunodeficiency virus) test. STI (sexually  transmitted infection) testing, if you are at risk. Lung cancer screening. Colorectal cancer screening. Prostate cancer screening. Abdominal aortic aneurysm (AAA) screening. You may need this if you are a current or former smoker. Talk with your health care provider about your test results, treatment options, and if necessary, the need for more tests. Follow these instructions at home: Eating and  drinking  Eat a diet that includes fresh fruits and vegetables, whole grains, lean protein, and low-fat dairy products. Limit your intake of foods with high amounts of sugar, saturated fats, and salt. Take vitamin and mineral supplements as recommended by your health care provider. Do not drink alcohol if your health care provider tells you not to drink. If you drink alcohol: Limit how much you have to 0-2 drinks a day. Know how much alcohol is in your drink. In the [place] one drink equals one 12 oz bottle of beer (355 mL), one 5 oz glass of wine (148 mL), or one 1 oz glass of hard liquor (44 mL). Lifestyle Brush your teeth every morning and night with fluoride toothpaste. Floss one time each day. Exercise for at least 30 minutes 5 or more days each week. Do not use any products that contain nicotine or tobacco. These products include cigarettes, chewing tobacco, and vaping devices, such as e-cigarettes. If you need help quitting, ask your health care provider. Do not use drugs. If you are sexually active, practice safe sex. Use a condom or other form of protection to prevent STIs. Take aspirin only as told by your health care provider. Make sure that you understand how much to take and what form to take. Work with your health care provider to find out whether it is safe and beneficial for you to take aspirin daily. Ask your health care provider if you need to take a cholesterol-lowering medicine (statin). Find healthy ways to manage stress, such as: Meditation, yoga, or listening to music. Journaling. Talking to a trusted person. Spending time with friends and family. Safety Always wear your seat belt while driving or riding in a vehicle. Do not drive: If you have been drinking alcohol. Do not ride with someone who has been drinking. When you are tired or distracted. While texting. If you have been using any mind-altering substances or drugs. Wear a helmet and other protective equipment  during sports activities. If you have firearms in your house, make sure you follow all gun safety procedures. Minimize exposure to UV radiation to reduce your risk of skin cancer. What's next? Visit your health care provider once a year for an annual wellness visit. Ask your health care provider how often you should have your eyes and teeth checked. Stay up to date on all vaccines. This information is not intended to replace advice given to you by your health care provider. Make sure you discuss any questions you have with your health care provider. Document Revised: 06/03/2021 Document Reviewed: 06/03/2021 Elsevier Patient Education  2024 [employer].

## 2023-12-05 NOTE — Assessment & Plan Note (Signed)
Blood pressure at goal today on amlodipine 10 mg daily. 

## 2023-12-05 NOTE — Progress Notes (Signed)
Chief Complaint:  Jim Mathis. is a 72 y.o. male who presents today for his annual comprehensive physical exam.    Assessment/Plan:  Chronic Problems Addressed Today: Polymyalgia rheumatica (HCC) Following with rheumatology.  He is now on prednisone 5 mg daily and methotrexate weekly.  His company issues are working well.  BPH with ED and OAB Overall symptoms are stable on tadalafil 20 mg daily and finasteride 5 mg daily.  Check PSA.  Essential hypertension Blood pressure at goal today on amlodipine 10 mg daily.  Dyslipidemia Discussed lifestyle occasions.  He is on Lipitor 40 mg daily.  Check lipids.  Paresthesias He has followed with neurosurgery for this.  He has had nerve conduction studies done previously which showed ulnar neuropathy and carpal tunnel syndrome.  His symptoms do typically improve with prednisone however still having quite a few symptoms.  We did discuss referral to see orthopedics or alternative neurologist however he declined for now.  He will let us know if he changes his mind.  Prediabetes Check A1c.   Preventative Healthcare: Check labs.  Up-to-date on flu and pneumonia.  Had colonoscopy earlier this year-repeat in 10 years.  Patient Counseling(The following topics were reviewed and/or handout was given):  -Nutrition: Stressed importance of moderation in sodium/caffeine intake, saturated fat and cholesterol, caloric balance, sufficient intake of fresh fruits, vegetables, and fiber.  -Stressed the importance of regular exercise.   -Substance Abuse: Discussed cessation/primary prevention of tobacco, alcohol, or other drug use; driving or other dangerous activities under the influence; availability of treatment for abuse.   -Injury prevention: Discussed safety belts, safety helmets, smoke detector, smoking near bedding or upholstery.   -Sexuality: Discussed sexually transmitted diseases, partner selection, use of condoms, avoidance of unintended  pregnancy and contraceptive alternatives.   -Dental health: Discussed importance of regular tooth brushing, flossing, and dental visits.  -Health maintenance and immunizations reviewed. Please refer to Health maintenance section.  Return to care in 1 year for next preventative visit.     Subjective:  HPI:  He has no acute complaints today. See Assessment / plan for status of chronic conditions.  Since our last visit he has followed rheumatology for PMR. They have started him on methotrexate several weeks ago. This seems to be working well for his muscular pain.  He is still having quite a bit of neuropathic pain.  He is previously followed with neurosurgery for this.  Lifestyle Diet: Balanced. Plenty of fruits and vegetables.  Exercise: Gets routine exercise 3-4 days per week.      12/05/2023   10:28 AM  Depression screen PHQ 2/9  Decreased Interest 0  Down, Depressed, Hopeless 0  PHQ - 2 Score 0  Altered sleeping 0  Tired, decreased energy 0  Change in appetite 0  Feeling bad or failure about yourself  0  Trouble concentrating 0  Moving slowly or fidgety/restless 0  Suicidal thoughts 0  PHQ-9 Score 0  Difficult doing work/chores Not difficult at all   There are no preventive care reminders to display for this patient.   ROS: Per HPI, otherwise a complete review of systems was negative.   PMH:  The following were reviewed and entered/updated in epic: Past Medical History:  Diagnosis Date   Allergy 2022   getting more severe each year   Anxiety 30 years   anxiety while sleeping   Arthritis approx. 2016   lower back and neck, leads to headaches.   BPH with obstruction/lower urinary tract symptoms  Carotid stenosis, right    per pt was told he has some stenosis but not enough for surgery;   in epic under media ,  carotid ultrasound result right ICA 50-69% and incidental finding thyroid nodule   Cervicalgia    ED (erectile dysfunction)    History of colon  resection    per pt 12 hours after partial knee arthroplasty 2017,  s/p partial colectomy was possible from divertiulitis   Hyperlipidemia    Hypertension    Migraines    PAD (peripheral artery disease) (HCC) 08-14-2019  per pt when he walks (walks 2 miles a day) right foot gets numb but recovers quickly and when he rides his bike left hand gets numb by recovers quickly ;   denies claudication or swelling   per pt s/p  stenting to bilateral lower legs in [place] one in 2003 and the other 2007,  no follow up since moved to [facility]   Wears glasses    Patient Active Problem List   Diagnosis Date Noted   Prediabetes 09/27/2023   Neuropathic pain 03/23/2023   Polymyalgia rheumatica (HCC) 09/07/2022   Paresthesias 07/19/2022   SBO (small bowel obstruction) (HCC) 05/08/2021   PAD (peripheral artery disease) (HCC)    Actinic keratosis 05/06/2020   Nonintractable headache 08/22/2019   BPH with ED and OAB 05/24/2019   Essential hypertension 05/24/2019   Dyslipidemia 05/24/2019   Anxiety 05/24/2019   Seborrheic dermatitis 05/24/2019   Osteoarthritis 07/13/2012   Past Surgical History:  Procedure Laterality Date   COLON SURGERY  2017   sigmoid colon perforation   COLOSTOMY REVERSAL  02-21-2017     [place], [place] (OR record scanned in epic)   w/ sigmoid and descending colectomy with appendectomy   CYSTOSCOPY WITH INJECTION N/A 12/19/2019   Procedure: CYSTOSCOPY WITH INJECTION;  Surgeon: Sebastian Ache, MD;  Location: [facility];  Service: Urology;  Laterality: N/A;   CYSTOSCOPY WITH URETHRAL DILATATION N/A 12/19/2019   Procedure: CYSTOSCOPY WITH URETHRAL DILATATION;  Surgeon: Sebastian Ache, MD;  Location: [facility];  Service: Urology;  Laterality: N/A;  45 MINS   ENDOVASCULAR STENT INSERTION  2003 and 2007  --- both done in [place]   bilateral legs   PARTIAL COLECTOMY  09-04-2016  in [place], [place]   w/  colostomy for perferated divierticulitis   PARTIAL KNEE  ARTHROPLASTY Left 2017   PILONIDAL CYST EXCISION     TOTAL KNEE ARTHROPLASTY Right 2012   TRANSURETHRAL RESECTION OF PROSTATE N/A 08/15/2019   Procedure: TRANSURETHRAL RESECTION OF THE PROSTATE (TURP);  Surgeon: Sebastian Ache, MD;  Location: [facility];  Service: Urology;  Laterality: N/A;    Family History  Problem Relation Age of Onset   Alzheimer's disease Mother    Arthritis Mother    Hyperlipidemia Mother    Hypertension Mother    Lung cancer Father        non smoker   Cancer Father    COPD Sister    Heart failure Sister    ALS Brother    Depression Brother    Early death Brother    Heart disease Sister    Colon cancer Neg Hx    Esophageal cancer Neg Hx    Pancreatic cancer Neg Hx    Stomach cancer Neg Hx    Liver disease Neg Hx    Rectal cancer Neg Hx     Medications- reviewed and updated Current Outpatient Medications  Medication Sig Dispense Refill   amLODipine (  NORVASC) 10 MG tablet Take 1 tablet (10 mg total) by mouth daily. 90 tablet 0   atorvastatin (LIPITOR) 40 MG tablet Take 1 tablet (40 mg total) by mouth daily. 90 tablet 3   doxepin (SINEQUAN) 25 MG capsule Take 1 capsule (25 mg total) by mouth daily. 90 capsule 1   finasteride (PROSCAR) 5 MG tablet Take 1 tablet (5 mg total) by mouth at bedtime. 90 tablet 3   folic acid (FOLVITE) 1 MG tablet daily.     methotrexate (RHEUMATREX) 2.5 MG tablet Take 10 mg by mouth once a week.     predniSONE (DELTASONE) 5 MG tablet TAKE 1 TABLET (5MG ) BY MOUTH ONCE DAILY ALONG WITH A 1MG  TAB AS DIRECTED BY PROVIDER     tadalafil (CIALIS) 20 MG tablet Take 1 tablet (20 mg total) by mouth daily. 90 tablet 3   triamcinolone ointment (KENALOG) 0.5 % APPLY TOPICALLY TO AFFECTED AREA(S) THREE TIMES A DAY (Patient taking differently: As needed) 30 g 3   No current facility-administered medications for this visit.    Allergies-reviewed and updated Allergies  Allergen Reactions   Pollen Extract Cough, Itching  and Other (See Comments)    Social History   Socioeconomic History   Marital status: Married    Spouse name: Not on file   Number of children: Not on file   Years of education: Not on file   Highest education level: Master's degree (e.g., MA, MS, MEng, MEd, MSW, MBA)  Occupational History   Not on file  Tobacco Use   Smoking status: Former    Current packs/day: 0.00    Average packs/day: 1 pack/day for 20.0 years (20.0 ttl pk-yrs)    Types: Cigarettes, Cigars    Start date: 05/23/1974    Quit date: 05/23/1994    Years since quitting: 29.5   Smokeless tobacco: Never   Tobacco comments:    tobacco free 29 years  Vaping Use   Vaping status: Never Used  Substance and Sexual Activity   Alcohol use: Yes    Alcohol/week: 4.0 standard drinks of alcohol    Types: 2 Glasses of wine, 2 Cans of beer per week    Comment: occasionally   Drug use: Never   Sexual activity: Yes    Birth control/protection: None  Other Topics Concern   Not on file  Social History Narrative   Right handed   Lives in two story home with wife   Social Drivers of Health   Financial Resource Strain: Low Risk  (05/05/2023)   Overall Financial Resource Strain (CARDIA)    Difficulty of Paying Living Expenses: Not hard at all  Food Insecurity: No Food Insecurity (05/05/2023)   Hunger Vital Sign    Worried About Running Out of Food in the Last Year: Never true    Ran Out of Food in the Last Year: Never true  Transportation Needs: No Transportation Needs (05/05/2023)   PRAPARE - Administrator, Civil Service (Medical): No    Lack of Transportation (Non-Medical): No  Physical Activity: Sufficiently Active (05/05/2023)   Exercise Vital Sign    Days of Exercise per Week: 3 days    Minutes of Exercise per Session: 60 min  Stress: Stress Concern Present (05/05/2023)   [employer] of Occupational Health - Occupational Stress Questionnaire    Feeling of Stress : To some extent  Social Connections:  Moderately Integrated (05/05/2023)   Social Connection and Isolation Panel [NHANES]    Frequency of Communication with  Friends and Family: Three times a week    Frequency of Social Gatherings with Friends and Family: Once a week    Attends Religious Services: More than 4 times per year    Active Member of [employer] or Organizations: No    Attends Engineer, structural: Never    Marital Status: Married        Objective:  Physical Exam: BP 136/79   Pulse 74   Temp 97.8 F (36.6 C) (Temporal)   Resp 18   Ht 5\' 10"  (1.778 m)   Wt 190 lb 6 oz (86.4 kg)   SpO2 96%   BMI 27.32 kg/m   Body mass index is 27.32 kg/m. Wt Readings from Last 3 Encounters:  12/05/23 190 lb 6 oz (86.4 kg)  09/27/23 189 lb 3.2 oz (85.8 kg)  07/11/23 191 lb (86.6 kg)   Gen: NAD, resting comfortably HEENT: TMs normal bilaterally. OP clear. No thyromegaly noted.  CV: RRR with no murmurs appreciated Pulm: NWOB, CTAB with no crackles, wheezes, or rhonchi GI: Normal bowel sounds present. Soft, Nontender, Nondistended. MSK: no edema, cyanosis, or clubbing noted Skin: warm, dry Neuro: CN2-12 grossly intact. Strength 5/5 in upper and lower extremities. Reflexes symmetric and intact bilaterally.  Psych: Normal affect and thought content     Mekhia Brogan M. Jimmey Ralph, MD 12/05/2023 11:10 AM

## 2023-12-05 NOTE — Addendum Note (Signed)
Addended by: Lorn Junes on: 12/05/2023 04:48 PM   Modules accepted: Orders

## 2023-12-05 NOTE — Assessment & Plan Note (Signed)
Following with rheumatology.  He is now on prednisone 5 mg daily and methotrexate weekly.  His company issues are working well.

## 2023-12-05 NOTE — Assessment & Plan Note (Signed)
Discussed lifestyle occasions.  He is on Lipitor 40 mg daily.  Check lipids.

## 2023-12-07 NOTE — Progress Notes (Signed)
Cholesterol is up a little bit since last year.  His A1c is also borderline.  Do not need to make any changes to his treatment plan at this time.  He should continue to work on diet and exercise.  We can recheck everything in a year.

## 2023-12-22 DIAGNOSIS — Z6826 Body mass index (BMI) 26.0-26.9, adult: Secondary | ICD-10-CM | POA: Diagnosis not present

## 2023-12-22 DIAGNOSIS — L2989 Other pruritus: Secondary | ICD-10-CM | POA: Diagnosis not present

## 2023-12-22 DIAGNOSIS — L820 Inflamed seborrheic keratosis: Secondary | ICD-10-CM | POA: Diagnosis not present

## 2023-12-22 DIAGNOSIS — G5603 Carpal tunnel syndrome, bilateral upper limbs: Secondary | ICD-10-CM | POA: Diagnosis not present

## 2023-12-22 DIAGNOSIS — D485 Neoplasm of uncertain behavior of skin: Secondary | ICD-10-CM | POA: Diagnosis not present

## 2023-12-22 DIAGNOSIS — L821 Other seborrheic keratosis: Secondary | ICD-10-CM | POA: Diagnosis not present

## 2023-12-22 DIAGNOSIS — L538 Other specified erythematous conditions: Secondary | ICD-10-CM | POA: Diagnosis not present

## 2023-12-22 DIAGNOSIS — L570 Actinic keratosis: Secondary | ICD-10-CM | POA: Diagnosis not present

## 2023-12-22 DIAGNOSIS — L578 Other skin changes due to chronic exposure to nonionizing radiation: Secondary | ICD-10-CM | POA: Diagnosis not present

## 2024-01-02 DIAGNOSIS — G5600 Carpal tunnel syndrome, unspecified upper limb: Secondary | ICD-10-CM | POA: Diagnosis not present

## 2024-01-02 DIAGNOSIS — M7552 Bursitis of left shoulder: Secondary | ICD-10-CM | POA: Diagnosis not present

## 2024-01-02 DIAGNOSIS — M419 Scoliosis, unspecified: Secondary | ICD-10-CM | POA: Diagnosis not present

## 2024-01-02 DIAGNOSIS — M0609 Rheumatoid arthritis without rheumatoid factor, multiple sites: Secondary | ICD-10-CM | POA: Diagnosis not present

## 2024-01-02 DIAGNOSIS — Z79899 Other long term (current) drug therapy: Secondary | ICD-10-CM | POA: Diagnosis not present

## 2024-01-02 DIAGNOSIS — M353 Polymyalgia rheumatica: Secondary | ICD-10-CM | POA: Diagnosis not present

## 2024-01-02 DIAGNOSIS — M5416 Radiculopathy, lumbar region: Secondary | ICD-10-CM | POA: Diagnosis not present

## 2024-01-02 DIAGNOSIS — M542 Cervicalgia: Secondary | ICD-10-CM | POA: Diagnosis not present

## 2024-01-02 LAB — LAB REPORT - SCANNED: EGFR: 95

## 2024-01-13 ENCOUNTER — Other Ambulatory Visit: Payer: Self-pay | Admitting: Family Medicine

## 2024-01-25 ENCOUNTER — Telehealth: Payer: PPO

## 2024-01-25 DIAGNOSIS — J101 Influenza due to other identified influenza virus with other respiratory manifestations: Secondary | ICD-10-CM

## 2024-01-25 MED ORDER — PROMETHAZINE-DM 6.25-15 MG/5ML PO SYRP: 5.0000 mL | ORAL_SOLUTION | Freq: Every day | ORAL | 0 refills | Status: DC

## 2024-01-25 MED ORDER — OSELTAMIVIR PHOSPHATE 75 MG PO CAPS: 75.0000 mg | ORAL_CAPSULE | Freq: Two times a day (BID) | ORAL | 0 refills | Status: DC

## 2024-01-25 MED ORDER — BENZONATATE 100 MG PO CAPS: 100.0000 mg | ORAL_CAPSULE | Freq: Three times a day (TID) | ORAL | 0 refills | Status: DC | PRN

## 2024-01-25 NOTE — Progress Notes (Signed)
 Virtual Visit Consent   Jim Sutherlin Jr., you are scheduled for a virtual visit with a [facility] provider today. Just as with appointments in the office, your consent must be obtained to participate. Your consent will be active for this visit and any virtual visit you may have with one of our providers in the next 365 days. If you have a MyChart account, a copy of this consent can be sent to you electronically.  As this is a virtual visit, video technology does not allow for your provider to perform a traditional examination. This may limit your provider's ability to fully assess your condition. If your provider identifies any concerns that need to be evaluated in person or the need to arrange testing (such as labs, EKG, etc.), we will make arrangements to do so. Although advances in technology are sophisticated, we cannot ensure that it will always work on either your end or our end. If the connection with a video visit is poor, the visit may have to be switched to a telephone visit. With either a video or telephone visit, we are not always able to ensure that we have a secure connection.  By engaging in this virtual visit, you consent to the provision of healthcare and authorize for your insurance to be billed (if applicable) for the services provided during this visit. Depending on your insurance coverage, you may receive a charge related to this service.  I need to obtain your verbal consent now. Are you willing to proceed with your visit today? Jim Mathis. has provided verbal consent on 01/25/2024 for a virtual visit (video or telephone). Jim CHRISTELLA Dickinson, PA-C  Date: 01/25/2024 8:11 AM  Virtual Visit via Video Note   I, Jim Mathis, connected with  Jim Mathis.  ([id number], 09-05-1951) on 01/25/24 at  8:00 AM EST by a video-enabled telemedicine application and verified that I am speaking with the correct person using two identifiers.  Location: Patient: Virtual Visit  Location Patient: Home Provider: Virtual Visit Location Provider: Home Office   I discussed the limitations of evaluation and management by telemedicine and the availability of in person appointments. The patient expressed understanding and agreed to proceed.    History of Present Illness: Jim Mathis. is a 73 y.o. who identifies as a male who was assigned male at birth, and is being seen today for influenza.  HPI: Influenza This is a new problem. The current episode started yesterday. The problem occurs constantly. The problem has been gradually worsening. Associated symptoms include chills, congestion, coughing, fatigue, a fever (low grade), headaches, myalgias and a sore throat. Pertinent negatives include no chest pain, diaphoresis, nausea or vomiting. Nothing aggravates the symptoms. Treatments tried: aleve. The treatment provided no relief.   Wife has Flu A.  Problems:  Patient Active Problem List   Diagnosis Date Noted   Prediabetes 09/27/2023   Neuropathic pain 03/23/2023   Polymyalgia rheumatica (HCC) 09/07/2022   Paresthesias 07/19/2022   SBO (small bowel obstruction) (HCC) 05/08/2021   PAD (peripheral artery disease) (HCC)    Actinic keratosis 05/06/2020   Nonintractable headache 08/22/2019   BPH with ED and OAB 05/24/2019   Essential hypertension 05/24/2019   Dyslipidemia 05/24/2019   Anxiety 05/24/2019   Seborrheic dermatitis 05/24/2019   Osteoarthritis 07/13/2012    Allergies:  Allergies  Allergen Reactions   Pollen Extract Cough, Itching and Other (See Comments)   Medications:  Current Outpatient Medications:    benzonatate  (TESSALON ) 100 MG capsule, Take 1-2  capsules (100-200 mg total) by mouth 3 (three) times daily as needed., Disp: 30 capsule, Rfl: 0   oseltamivir  (TAMIFLU ) 75 MG capsule, Take 1 capsule (75 mg total) by mouth 2 (two) times daily., Disp: 10 capsule, Rfl: 0   promethazine -dextromethorphan (PROMETHAZINE -DM) 6.25-15 MG/5ML syrup, Take 5 mLs  by mouth at bedtime., Disp: 118 mL, Rfl: 0   amLODipine  (NORVASC ) 10 MG tablet, Take 1 tablet (10 mg total) by mouth daily., Disp: 90 tablet, Rfl: 0   atorvastatin  (LIPITOR) 40 MG tablet, Take 1 tablet (40 mg total) by mouth daily., Disp: 90 tablet, Rfl: 3   doxepin  (SINEQUAN ) 25 MG capsule, Take 1 capsule (25 mg total) by mouth daily., Disp: 90 capsule, Rfl: 1   finasteride  (PROSCAR ) 5 MG tablet, Take 1 tablet (5 mg total) by mouth at bedtime., Disp: 90 tablet, Rfl: 3   folic acid (FOLVITE) 1 MG tablet, daily., Disp: , Rfl:    methotrexate (RHEUMATREX) 2.5 MG tablet, Take 10 mg by mouth once a week., Disp: , Rfl:    predniSONE  (DELTASONE ) 5 MG tablet, TAKE 1 TABLET (5MG ) BY MOUTH ONCE DAILY ALONG WITH A 1MG  TAB AS DIRECTED BY PROVIDER, Disp: , Rfl:    tadalafil  (CIALIS ) 20 MG tablet, Take 1 tablet (20 mg total) by mouth daily., Disp: 90 tablet, Rfl: 3   triamcinolone  ointment (KENALOG ) 0.5 %, APPLY TO AFFECTED AREA(S) THREE TIMES A DAY, Disp: 30 g, Rfl: 3  Observations/Objective: Patient is well-developed, well-nourished in no acute distress.  Resting comfortably at home.  Head is normocephalic, atraumatic.  No labored breathing.  Speech is clear and coherent with logical content.  Patient is alert and oriented at baseline.    Assessment and Plan: 1. Influenza A (Primary) - oseltamivir  (TAMIFLU ) 75 MG capsule; Take 1 capsule (75 mg total) by mouth 2 (two) times daily.  Dispense: 10 capsule; Refill: 0 - promethazine -dextromethorphan (PROMETHAZINE -DM) 6.25-15 MG/5ML syrup; Take 5 mLs by mouth at bedtime.  Dispense: 118 mL; Refill: 0 - benzonatate  (TESSALON ) 100 MG capsule; Take 1-2 capsules (100-200 mg total) by mouth 3 (three) times daily as needed.  Dispense: 30 capsule; Refill: 0  - Suspected viral URI with negative covid testing, positive for Flu A from wife - Possible flu as flu A is prevalent in the community at this time - Limited testing availability that would delay appropriate  treatment waiting on results - Tamiflu  prescribed for possible flu - Work note provided - Push fluids - Symptomatic management OTC of choice as needed - Seek in person evaluation if symptoms worsen or fail to improve   Follow Up Instructions: I discussed the assessment and treatment plan with the patient. The patient was provided an opportunity to ask questions and all were answered. The patient agreed with the plan and demonstrated an understanding of the instructions.  A copy of instructions were sent to the patient via MyChart unless otherwise noted below.    The patient was advised to call back or seek an in-person evaluation if the symptoms worsen or if the condition fails to improve as anticipated.    Jim CHRISTELLA Dickinson, PA-C

## 2024-01-25 NOTE — Patient Instructions (Signed)
 Jim Mathis., thank you for joining Delon CHRISTELLA Dickinson, PA-C for today's virtual visit.  While this provider is not your primary care provider (PCP), if your PCP is located in our provider database this encounter information will be shared with them immediately following your visit.   A [facility] MyChart account gives you access to today's visit and all your visits, tests, and labs performed at [facility]  click here if you don't have a [place] MyChart account or go to [email or web address]  Consent: (Patient) Jim Mathis. provided verbal consent for this virtual visit at the beginning of the encounter.  Current Medications:  Current Outpatient Medications:    benzonatate  (TESSALON ) 100 MG capsule, Take 1-2 capsules (100-200 mg total) by mouth 3 (three) times daily as needed., Disp: 30 capsule, Rfl: 0   oseltamivir  (TAMIFLU ) 75 MG capsule, Take 1 capsule (75 mg total) by mouth 2 (two) times daily., Disp: 10 capsule, Rfl: 0   promethazine -dextromethorphan (PROMETHAZINE -DM) 6.25-15 MG/5ML syrup, Take 5 mLs by mouth at bedtime., Disp: 118 mL, Rfl: 0   amLODipine  (NORVASC ) 10 MG tablet, Take 1 tablet (10 mg total) by mouth daily., Disp: 90 tablet, Rfl: 0   atorvastatin  (LIPITOR) 40 MG tablet, Take 1 tablet (40 mg total) by mouth daily., Disp: 90 tablet, Rfl: 3   doxepin  (SINEQUAN ) 25 MG capsule, Take 1 capsule (25 mg total) by mouth daily., Disp: 90 capsule, Rfl: 1   finasteride  (PROSCAR ) 5 MG tablet, Take 1 tablet (5 mg total) by mouth at bedtime., Disp: 90 tablet, Rfl: 3   folic acid (FOLVITE) 1 MG tablet, daily., Disp: , Rfl:    methotrexate (RHEUMATREX) 2.5 MG tablet, Take 10 mg by mouth once a week., Disp: , Rfl:    predniSONE  (DELTASONE ) 5 MG tablet, TAKE 1 TABLET (5MG ) BY MOUTH ONCE DAILY ALONG WITH A 1MG  TAB AS DIRECTED BY PROVIDER, Disp: , Rfl:    tadalafil  (CIALIS ) 20 MG tablet, Take 1 tablet (20 mg total) by mouth daily., Disp: 90 tablet, Rfl: 3    triamcinolone  ointment (KENALOG ) 0.5 %, APPLY TO AFFECTED AREA(S) THREE TIMES A DAY, Disp: 30 g, Rfl: 3   Medications ordered in this encounter:  Meds ordered this encounter  Medications   oseltamivir  (TAMIFLU ) 75 MG capsule    Sig: Take 1 capsule (75 mg total) by mouth 2 (two) times daily.    Dispense:  10 capsule    Refill:  0    Supervising Provider:   BLAISE ALEENE KIDD [[id number]]   promethazine -dextromethorphan (PROMETHAZINE -DM) 6.25-15 MG/5ML syrup    Sig: Take 5 mLs by mouth at bedtime.    Dispense:  118 mL    Refill:  0    Supervising Provider:   BLAISE ALEENE KIDD [[id number]]   benzonatate  (TESSALON ) 100 MG capsule    Sig: Take 1-2 capsules (100-200 mg total) by mouth 3 (three) times daily as needed.    Dispense:  30 capsule    Refill:  0    Supervising Provider:   BLAISE ALEENE KIDD [[id number]]     *If you need refills on other medications prior to your next appointment, please contact your pharmacy*  Follow-Up: Call back or seek an in-person evaluation if the symptoms worsen or if the condition fails to improve as anticipated.  [facility] [phone]  Other Instructions  Influenza, Adult Influenza is also called the flu. It's an infection that affects your respiratory tract. This includes your nose, throat, windpipe, and lungs. The  flu is contagious. This means it spreads easily from person to person. It causes symptoms that are like a cold. It can also cause a high fever and body aches. What are the causes? The flu is caused by the influenza virus. You can get it by: Breathing in droplets that are in the air after an infected person coughs or sneezes. Touching something that has the virus on it and then touching your mouth, nose, or eyes. What increases the risk? You may be more likely to get the flu if: You don't wash your hands often. You're near a lot of people during cold and flu season. You touch your mouth, eyes, or nose without washing your hands  first. You don't get a flu shot each year. You may also be more at risk for the flu and serious problems, such as a lung infection called pneumonia, if: You're older than 65. You're pregnant. Your immune system is weak. Your immune system is your body's defense system. You have a long-term, or chronic, condition, such as: Heart, kidney, or lung disease. Diabetes. A liver disorder. Asthma. You're very overweight. You have anemia. This is when you don't have enough red blood cells in your body. What are the signs or symptoms? Flu symptoms often start all of a sudden. They may last 4-14 days and include: Fever and chills. Headaches, body aches, or muscle aches. Sore throat. Cough. Runny or stuffy nose. Discomfort in your chest. Not wanting to eat as much as normal. Feeling weak or tired. Feeling dizzy. Nausea or vomiting. How is this diagnosed? The flu may be diagnosed based on your symptoms and medical history. You may also have a physical exam. A swab may be taken from your nose or throat and tested for the virus. How is this treated? If the flu is found early, you can be treated with antiviral medicine. This may be given to you by mouth or through an IV. It can help you feel less sick and get better faster. Taking care of yourself at home can also help your symptoms get better. Your health care provider may tell you to: Take over-the-counter medicines. Drink lots of fluids. The flu often goes away on its own. If you have very bad symptoms or problems caused by the flu, you may need to be treated in a hospital. Follow these instructions at home: Activity Rest as needed. Get lots of sleep. Stay home from work or school as told by your provider. Leave home only to go see your provider. Do not leave home for other reasons until you don't have a fever for 24 hours without taking medicine. Eating and drinking Take an oral rehydration solution (ORS). This is a drink that is sold at  pharmacies and stores. Drink enough fluid to keep your pee pale yellow. Try to drink small amounts of clear fluids. These include water, ice chips, fruit juice mixed with water, and low-calorie sports drinks. Try to eat bland foods that are easy to digest. These include bananas, applesauce, rice, lean meats, toast, and crackers. Avoid drinks that have a lot of sugar or caffeine in them. These include energy drinks, regular sports drinks, and soda. Do not drink alcohol. Do not eat spicy or fatty foods. General instructions     Take your medicines only as told by your provider. Use a cool mist humidifier to add moisture to the air in your home. This can make it easier for you to breathe. You should also clean the humidifier  every day. To do so: Empty the water. Pour clean water in. Cover your mouth and nose when you cough or sneeze. Wash your hands with soap and water often and for at least 20 seconds. It's extra important to do so after you cough or sneeze. If you can't use soap and water, use hand sanitizer. How is this prevented?  Get a flu shot every year. Ask your provider when you should get your flu shot. Stay away from people who are sick during fall and winter. Fall and winter are cold and flu season. Contact a health care provider if: You get new symptoms. You have chest pain. You have watery poop, also called diarrhea. You have a fever. Your cough gets worse. You start to have more mucus. You feel like you may vomit, or you vomit. Get help right away if: You become short of breath or have trouble breathing. Your skin or nails turn blue. You have very bad pain or stiffness in your neck. You get a sudden headache or pain in your face or ear. You vomit each time you eat or drink. These symptoms may be an emergency. Call 911 right away. Do not wait to see if the symptoms will go away. Do not drive yourself to the hospital. This information is not intended to replace advice  given to you by your health care provider. Make sure you discuss any questions you have with your health care provider. Document Revised: 09/08/2023 Document Reviewed: 01/13/2023 Elsevier Patient Education  2024 [employer]   If you have been instructed to have an in-person evaluation today at a local Urgent Care facility, please use the link below. It will take you to a list of all of our available [facility], including address, phone number and hours of operation. Please do not delay care.  [facility]  If you or a family member do not have a primary care provider, use the link below to schedule a visit and establish care. When you choose a [facility] primary care physician or advanced practice provider, you gain a long-term partner in health. Find a Primary Care Provider  Learn more about [facility]'s in-office and virtual care options: [facility] - Get Care Now

## 2024-01-31 DIAGNOSIS — M5416 Radiculopathy, lumbar region: Secondary | ICD-10-CM | POA: Diagnosis not present

## 2024-02-07 ENCOUNTER — Other Ambulatory Visit: Payer: Self-pay | Admitting: Family Medicine

## 2024-02-08 MED ORDER — AMLODIPINE BESYLATE 10 MG PO TABS: 10.0000 mg | ORAL_TABLET | Freq: Every day | ORAL | 0 refills | Status: DC

## 2024-02-15 ENCOUNTER — Telehealth: Payer: PPO | Admitting: Physician Assistant

## 2024-02-15 DIAGNOSIS — J019 Acute sinusitis, unspecified: Secondary | ICD-10-CM | POA: Diagnosis not present

## 2024-02-15 DIAGNOSIS — B9689 Other specified bacterial agents as the cause of diseases classified elsewhere: Secondary | ICD-10-CM | POA: Diagnosis not present

## 2024-02-15 MED ORDER — AMOXICILLIN-POT CLAVULANATE 875-125 MG PO TABS: 1.0000 | ORAL_TABLET | Freq: Two times a day (BID) | ORAL | 0 refills | Status: DC

## 2024-02-15 NOTE — Progress Notes (Signed)

## 2024-02-21 DIAGNOSIS — I739 Peripheral vascular disease, unspecified: Secondary | ICD-10-CM | POA: Diagnosis not present

## 2024-02-21 DIAGNOSIS — M5440 Lumbago with sciatica, unspecified side: Secondary | ICD-10-CM | POA: Diagnosis not present

## 2024-02-28 DIAGNOSIS — H53483 Generalized contraction of visual field, bilateral: Secondary | ICD-10-CM | POA: Diagnosis not present

## 2024-02-28 DIAGNOSIS — H0279 Other degenerative disorders of eyelid and periocular area: Secondary | ICD-10-CM | POA: Diagnosis not present

## 2024-02-28 DIAGNOSIS — H02834 Dermatochalasis of left upper eyelid: Secondary | ICD-10-CM | POA: Diagnosis not present

## 2024-02-28 DIAGNOSIS — H02412 Mechanical ptosis of left eyelid: Secondary | ICD-10-CM | POA: Diagnosis not present

## 2024-02-28 DIAGNOSIS — H02411 Mechanical ptosis of right eyelid: Secondary | ICD-10-CM | POA: Diagnosis not present

## 2024-02-28 DIAGNOSIS — H57813 Brow ptosis, bilateral: Secondary | ICD-10-CM | POA: Diagnosis not present

## 2024-02-28 DIAGNOSIS — Z01818 Encounter for other preprocedural examination: Secondary | ICD-10-CM | POA: Diagnosis not present

## 2024-02-28 DIAGNOSIS — H02831 Dermatochalasis of right upper eyelid: Secondary | ICD-10-CM | POA: Diagnosis not present

## 2024-02-28 DIAGNOSIS — H02413 Mechanical ptosis of bilateral eyelids: Secondary | ICD-10-CM | POA: Diagnosis not present

## 2024-03-01 ENCOUNTER — Other Ambulatory Visit: Payer: Self-pay

## 2024-03-01 DIAGNOSIS — I739 Peripheral vascular disease, unspecified: Secondary | ICD-10-CM

## 2024-03-06 ENCOUNTER — Other Ambulatory Visit: Payer: Self-pay | Admitting: *Deleted

## 2024-03-06 MED ORDER — DOXEPIN HCL 25 MG PO CAPS: 25.0000 mg | ORAL_CAPSULE | Freq: Every day | ORAL | 1 refills | Status: DC

## 2024-03-12 ENCOUNTER — Ambulatory Visit (HOSPITAL_COMMUNITY)
Admission: RE | Admit: 2024-03-12 | Discharge: 2024-03-12 | Disposition: A | Discharge status: Home / Self Care | Source: Ambulatory Visit | Attending: Surgery | Admitting: Surgery

## 2024-03-12 DIAGNOSIS — R21 Rash and other nonspecific skin eruption: Secondary | ICD-10-CM | POA: Diagnosis not present

## 2024-03-12 DIAGNOSIS — I739 Peripheral vascular disease, unspecified: Secondary | ICD-10-CM | POA: Insufficient documentation

## 2024-03-12 DIAGNOSIS — M353 Polymyalgia rheumatica: Secondary | ICD-10-CM | POA: Diagnosis not present

## 2024-03-12 DIAGNOSIS — Z79899 Other long term (current) drug therapy: Secondary | ICD-10-CM | POA: Diagnosis not present

## 2024-03-12 DIAGNOSIS — M5416 Radiculopathy, lumbar region: Secondary | ICD-10-CM | POA: Diagnosis not present

## 2024-03-12 DIAGNOSIS — M419 Scoliosis, unspecified: Secondary | ICD-10-CM | POA: Diagnosis not present

## 2024-03-12 DIAGNOSIS — M542 Cervicalgia: Secondary | ICD-10-CM | POA: Diagnosis not present

## 2024-03-12 DIAGNOSIS — M7552 Bursitis of left shoulder: Secondary | ICD-10-CM | POA: Diagnosis not present

## 2024-03-12 DIAGNOSIS — G5600 Carpal tunnel syndrome, unspecified upper limb: Secondary | ICD-10-CM | POA: Diagnosis not present

## 2024-03-12 DIAGNOSIS — M0609 Rheumatoid arthritis without rheumatoid factor, multiple sites: Secondary | ICD-10-CM | POA: Diagnosis not present

## 2024-03-12 LAB — LAB REPORT - SCANNED: EGFR: 91

## 2024-03-12 LAB — VAS US ABI WITH/WO TBI
Left ABI: 0.82
Right ABI: 0.6

## 2024-03-16 ENCOUNTER — Encounter: Payer: Self-pay | Admitting: Family Medicine

## 2024-03-16 NOTE — Telephone Encounter (Signed)
 Ok to order TSH.  Katina Degree. Jimmey Ralph, MD 03/16/2024 3:26 PM

## 2024-03-16 NOTE — Telephone Encounter (Signed)
**Note De-identified  [place] Obfuscation** Please advise 

## 2024-03-19 ENCOUNTER — Other Ambulatory Visit: Payer: Self-pay | Admitting: *Deleted

## 2024-03-19 DIAGNOSIS — R7989 Other specified abnormal findings of blood chemistry: Secondary | ICD-10-CM

## 2024-03-19 DIAGNOSIS — E038 Other specified hypothyroidism: Secondary | ICD-10-CM

## 2024-03-21 NOTE — Progress Notes (Unsigned)
 Office Note     CC: Claudication Requesting Provider:  Donalee Citrin, MD  HPI: Jim Mathis. is a 73 y.o. (1951/07/10) male presenting at the request of .Ardith Dark, MD for evaluation of claudication.  On exam,***  The pt is *** on a statin for cholesterol management.  The pt is *** on a daily aspirin.   Other AC:  *** The pt is *** on medication for hypertension.   The pt is *** diabetic.  Tobacco hx:  ***  Past Medical History:  Diagnosis Date   Allergy 2022   getting more severe each year   Anxiety 30 years   anxiety while sleeping   Arthritis approx. 2016   lower back and neck, leads to headaches.   BPH with obstruction/lower urinary tract symptoms    Carotid stenosis, right    per pt was told he has some stenosis but not enough for surgery;   in epic under media ,  carotid ultrasound result right ICA 50-69% and incidental finding thyroid nodule   Cervicalgia    ED (erectile dysfunction)    History of colon resection    per pt 12 hours after partial knee arthroplasty 2017,  s/p partial colectomy was possible from divertiulitis   Hyperlipidemia    Hypertension    Migraines    PAD (peripheral artery disease) (HCC) 08-14-2019  per pt when he walks (walks 2 miles a day) right foot gets numb but recovers quickly and when he rides his bike left hand gets numb by recovers quickly ;   denies claudication or swelling   per pt s/p  stenting to bilateral lower legs in [place] one in 2003 and the other 2007,  no follow up since moved to [place]   Wears glasses     Past Surgical History:  Procedure Laterality Date   COLON SURGERY  2017   sigmoid colon perforation   COLOSTOMY REVERSAL  02-21-2017     [place], [place] (OR record scanned in epic)   w/ sigmoid and descending colectomy with appendectomy   CYSTOSCOPY WITH INJECTION N/A 12/19/2019   Procedure: CYSTOSCOPY WITH INJECTION;  Surgeon: Sebastian Ache, MD;  Location: [facility];  Service: Urology;  Laterality:  N/A;   CYSTOSCOPY WITH URETHRAL DILATATION N/A 12/19/2019   Procedure: CYSTOSCOPY WITH URETHRAL DILATATION;  Surgeon: Sebastian Ache, MD;  Location: [facility];  Service: Urology;  Laterality: N/A;  45 MINS   ENDOVASCULAR STENT INSERTION  2003 and 2007  --- both done in [place]   bilateral legs   PARTIAL COLECTOMY  09-04-2016  in [place], [place]   w/  colostomy for perferated divierticulitis   PARTIAL KNEE ARTHROPLASTY Left 2017   PILONIDAL CYST EXCISION     TOTAL KNEE ARTHROPLASTY Right 2012   TRANSURETHRAL RESECTION OF PROSTATE N/A 08/15/2019   Procedure: TRANSURETHRAL RESECTION OF THE PROSTATE (TURP);  Surgeon: Sebastian Ache, MD;  Location[facility];  Service: Urology;  Laterality: N/A;    Social History   Socioeconomic History   Marital status: Married    Spouse name: Not on file   Number of children: Not on file   Years of education: Not on file   Highest education level: Master's degree (e.g., MA, MS, MEng, MEd, MSW, MBA)  Occupational History   Not on file  Tobacco Use   Smoking status: Former    Current packs/day: 0.00    Average packs/day: 1 pack/day for 20.0 years (20.0 ttl pk-yrs)  Types: Cigarettes, Cigars    Start date: 05/23/1974    Quit date: 05/23/1994    Years since quitting: 29.8   Smokeless tobacco: Never   Tobacco comments:    tobacco free 29 years  Vaping Use   Vaping status: Never Used  Substance and Sexual Activity   Alcohol use: Yes    Alcohol/week: 4.0 standard drinks of alcohol    Types: 2 Glasses of wine, 2 Cans of beer per week    Comment: occasionally   Drug use: Never   Sexual activity: Yes    Birth control/protection: None  Other Topics Concern   Not on file  Social History Narrative   Right handed   Lives in two story home with wife   Social Drivers of Health   Financial Resource Strain: Low Risk  (05/05/2023)   Overall Financial Resource Strain (CARDIA)    Difficulty of Paying Living Expenses: Not  hard at all  Food Insecurity: No Food Insecurity (05/05/2023)   Hunger Vital Sign    Worried About Running Out of Food in the Last Year: Never true    Ran Out of Food in the Last Year: Never true  Transportation Needs: No Transportation Needs (05/05/2023)   PRAPARE - Administrator, Civil Service (Medical): No    Lack of Transportation (Non-Medical): No  Physical Activity: Sufficiently Active (05/05/2023)   Exercise Vital Sign    Days of Exercise per Week: 3 days    Minutes of Exercise per Session: 60 min  Stress: Stress Concern Present (05/05/2023)   [employer] of Occupational Health - Occupational Stress Questionnaire    Feeling of Stress : To some extent  Social Connections: Moderately Integrated (05/05/2023)   Social Connection and Isolation Panel [NHANES]    Frequency of Communication with Friends and Family: Three times a week    Frequency of Social Gatherings with Friends and Family: Once a week    Attends Religious Services: More than 4 times per year    Active Member of [employer] or Organizations: No    Attends Banker Meetings: Never    Marital Status: Married  Catering manager Violence: Not At Risk (05/09/2023)   Humiliation, Afraid, Rape, and Kick questionnaire    Fear of Current or Ex-Partner: No    Emotionally Abused: No    Physically Abused: No    Sexually Abused: No   *** Family History  Problem Relation Age of Onset   Alzheimer's disease Mother    Arthritis Mother    Hyperlipidemia Mother    Hypertension Mother    Lung cancer Father        non smoker   Cancer Father    COPD Sister    Heart failure Sister    ALS Brother    Depression Brother    Early death Brother    Heart disease Sister    Colon cancer Neg Hx    Esophageal cancer Neg Hx    Pancreatic cancer Neg Hx    Stomach cancer Neg Hx    Liver disease Neg Hx    Rectal cancer Neg Hx     Current Outpatient Medications  Medication Sig Dispense Refill   amLODipine  (NORVASC) 10 MG tablet Take 1 tablet (10 mg total) by mouth daily. 90 tablet 0   amoxicillin-clavulanate (AUGMENTIN) 875-125 MG tablet Take 1 tablet by mouth 2 (two) times daily. 14 tablet 0   atorvastatin (LIPITOR) 40 MG tablet Take 1 tablet (40 mg total) by  mouth daily. 90 tablet 3   benzonatate (TESSALON) 100 MG capsule Take 1-2 capsules (100-200 mg total) by mouth 3 (three) times daily as needed. 30 capsule 0   doxepin (SINEQUAN) 25 MG capsule Take 1 capsule (25 mg total) by mouth daily. 90 capsule 1   finasteride (PROSCAR) 5 MG tablet Take 1 tablet (5 mg total) by mouth at bedtime. 90 tablet 3   folic acid (FOLVITE) 1 MG tablet daily.     methotrexate (RHEUMATREX) 2.5 MG tablet Take 10 mg by mouth once a week.     oseltamivir (TAMIFLU) 75 MG capsule Take 1 capsule (75 mg total) by mouth 2 (two) times daily. 10 capsule 0   predniSONE (DELTASONE) 5 MG tablet TAKE 1 TABLET (5MG ) BY MOUTH ONCE DAILY ALONG WITH A 1MG  TAB AS DIRECTED BY PROVIDER     promethazine-dextromethorphan (PROMETHAZINE-DM) 6.25-15 MG/5ML syrup Take 5 mLs by mouth at bedtime. 118 mL 0   tadalafil (CIALIS) 20 MG tablet Take 1 tablet (20 mg total) by mouth daily. 90 tablet 3   triamcinolone ointment (KENALOG) 0.5 % APPLY TO AFFECTED AREA(S) THREE TIMES A DAY 30 g 3   No current facility-administered medications for this visit.    Allergies  Allergen Reactions   Pollen Extract Cough, Itching and Other (See Comments)     REVIEW OF SYSTEMS:  *** [X]  denotes positive finding, [ ]  denotes negative finding Cardiac  Comments:  Chest pain or chest pressure:    Shortness of breath upon exertion:    Short of breath when lying flat:    Irregular heart rhythm:        Vascular    Pain in calf, thigh, or hip brought on by ambulation:    Pain in feet at night that wakes you up from your sleep:     Blood clot in your veins:    Leg swelling:         Pulmonary    Oxygen at home:    Productive cough:     Wheezing:          Neurologic    Sudden weakness in arms or legs:     Sudden numbness in arms or legs:     Sudden onset of difficulty speaking or slurred speech:    Temporary loss of vision in one eye:     Problems with dizziness:         Gastrointestinal    Blood in stool:     Vomited blood:         Genitourinary    Burning when urinating:     Blood in urine:        Psychiatric    Major depression:         Hematologic    Bleeding problems:    Problems with blood clotting too easily:        Skin    Rashes or ulcers:        Constitutional    Fever or chills:      PHYSICAL EXAMINATION:  There were no vitals filed for this visit.  General:  WDWN in NAD; vital signs documented above Gait: Not observed HENT: WNL, normocephalic Pulmonary: normal non-labored breathing , without wheezing Cardiac: {Desc; regular/irreg:14544} HR Abdomen: soft, NT, no masses Skin: {With/Without:20273} rashes Vascular Exam/Pulses:  Right Left  Radial {Exam; arterial pulse strength 0-4:30167} {Exam; arterial pulse strength 0-4:30167}  Ulnar {Exam; arterial pulse strength 0-4:30167} {Exam; arterial pulse strength 0-4:30167}  Femoral {Exam; arterial pulse strength  0-4:30167} {Exam; arterial pulse strength 0-4:30167}  Popliteal {Exam; arterial pulse strength 0-4:30167} {Exam; arterial pulse strength 0-4:30167}  DP {Exam; arterial pulse strength 0-4:30167} {Exam; arterial pulse strength 0-4:30167}  PT {Exam; arterial pulse strength 0-4:30167} {Exam; arterial pulse strength 0-4:30167}   Extremities: {With/Without:20273} ischemic changes, {With/Without:20273} Gangrene , {With/Without:20273} cellulitis; {With/Without:20273} open wounds;  Musculoskeletal: no muscle wasting or atrophy  Neurologic: A&O X 3;  No focal weakness or paresthesias are detected Psychiatric:  The pt has {Desc; normal/abnormal:11317::"Normal"} affect.   Non-Invasive Vascular Imaging:     ABI Findings:   +---------+------------------+-----+----------+--------+  Right   Rt Pressure (mmHg)IndexWaveform  Comment   +---------+------------------+-----+----------+--------+  Brachial 131                                        +---------+------------------+-----+----------+--------+  ATA     76                0.58 monophasic          +---------+------------------+-----+----------+--------+  PTA     79                0.60 monophasic          +---------+------------------+-----+----------+--------+  Great Toe59                0.45                     +---------+------------------+-----+----------+--------+   +---------+------------------+-----+--------+-------+  Left    Lt Pressure (mmHg)IndexWaveformComment  +---------+------------------+-----+--------+-------+  Brachial 128                                     +---------+------------------+-----+--------+-------+  ATA     98                0.75 biphasic         +---------+------------------+-----+--------+-------+  PTA     107               0.82 biphasic         +---------+------------------+-----+--------+-------+  Great Toe68                0.52                  +---------+------------------+-----+--------+-------+   +-------+-----------+-----------+------------+------------+  ABI/TBIToday's ABIToday's TBIPrevious ABIPrevious TBI  +-------+-----------+-----------+------------+------------+  Right 0.6        0.45       0.92        0.58          +-------+-----------+-----------+------------+------------+  Left  0.82       0.52       0.87        0.65          +-------+-----------+-----------+------------+------------+     ASSESSMENT/PLAN: Jim Traum. is a 73 y.o. male presenting with ***   ***   Victorino Sparrow, MD [facility] [phone]

## 2024-03-22 ENCOUNTER — Encounter: Payer: Self-pay | Admitting: Vascular Surgery

## 2024-03-22 ENCOUNTER — Ambulatory Visit: Admitting: Vascular Surgery

## 2024-03-22 VITALS — BP 134/81 | HR 74 | Temp 97.9°F | Resp 20 | Ht 70.0 in | Wt 189.0 lb

## 2024-03-22 DIAGNOSIS — I70213 Atherosclerosis of native arteries of extremities with intermittent claudication, bilateral legs: Secondary | ICD-10-CM | POA: Diagnosis not present

## 2024-03-22 MED ORDER — CILOSTAZOL 100 MG PO TABS: 100.0000 mg | ORAL_TABLET | Freq: Two times a day (BID) | ORAL | 11 refills | Status: DC

## 2024-03-23 ENCOUNTER — Other Ambulatory Visit (INDEPENDENT_AMBULATORY_CARE_PROVIDER_SITE_OTHER)

## 2024-03-23 ENCOUNTER — Encounter: Admitting: Vascular Surgery

## 2024-03-23 ENCOUNTER — Other Ambulatory Visit: Payer: Self-pay | Admitting: *Deleted

## 2024-03-23 DIAGNOSIS — Z0001 Encounter for general adult medical examination with abnormal findings: Secondary | ICD-10-CM

## 2024-03-23 DIAGNOSIS — L918 Other hypertrophic disorders of the skin: Secondary | ICD-10-CM | POA: Diagnosis not present

## 2024-03-23 DIAGNOSIS — E038 Other specified hypothyroidism: Secondary | ICD-10-CM

## 2024-03-23 DIAGNOSIS — L111 Transient acantholytic dermatosis [Grover]: Secondary | ICD-10-CM | POA: Diagnosis not present

## 2024-03-23 DIAGNOSIS — I739 Peripheral vascular disease, unspecified: Secondary | ICD-10-CM

## 2024-03-23 DIAGNOSIS — I70213 Atherosclerosis of native arteries of extremities with intermittent claudication, bilateral legs: Secondary | ICD-10-CM

## 2024-03-23 DIAGNOSIS — D485 Neoplasm of uncertain behavior of skin: Secondary | ICD-10-CM | POA: Diagnosis not present

## 2024-03-23 DIAGNOSIS — L820 Inflamed seborrheic keratosis: Secondary | ICD-10-CM | POA: Diagnosis not present

## 2024-03-23 DIAGNOSIS — R208 Other disturbances of skin sensation: Secondary | ICD-10-CM | POA: Diagnosis not present

## 2024-03-23 DIAGNOSIS — N400 Enlarged prostate without lower urinary tract symptoms: Secondary | ICD-10-CM | POA: Diagnosis not present

## 2024-03-23 LAB — TSH: TSH: 1.64 u[IU]/mL (ref 0.35–5.50)

## 2024-03-23 LAB — PSA: PSA: 0.5 ng/mL (ref 0.10–4.00)

## 2024-03-26 ENCOUNTER — Encounter: Payer: Self-pay | Admitting: Family Medicine

## 2024-03-26 NOTE — Progress Notes (Signed)
 Thyroid and PSA numbers are normal.

## 2024-03-27 ENCOUNTER — Encounter: Admitting: Vascular Surgery

## 2024-03-29 DIAGNOSIS — H02845 Edema of left lower eyelid: Secondary | ICD-10-CM | POA: Diagnosis not present

## 2024-03-29 DIAGNOSIS — H35033 Hypertensive retinopathy, bilateral: Secondary | ICD-10-CM | POA: Diagnosis not present

## 2024-03-29 DIAGNOSIS — H43813 Vitreous degeneration, bilateral: Secondary | ICD-10-CM | POA: Diagnosis not present

## 2024-03-29 DIAGNOSIS — Z961 Presence of intraocular lens: Secondary | ICD-10-CM | POA: Diagnosis not present

## 2024-03-29 DIAGNOSIS — H02842 Edema of right lower eyelid: Secondary | ICD-10-CM | POA: Diagnosis not present

## 2024-03-29 DIAGNOSIS — I10 Essential (primary) hypertension: Secondary | ICD-10-CM | POA: Diagnosis not present

## 2024-03-29 DIAGNOSIS — H57813 Brow ptosis, bilateral: Secondary | ICD-10-CM | POA: Diagnosis not present

## 2024-03-29 DIAGNOSIS — H524 Presbyopia: Secondary | ICD-10-CM | POA: Diagnosis not present

## 2024-03-29 DIAGNOSIS — H16223 Keratoconjunctivitis sicca, not specified as Sjogren's, bilateral: Secondary | ICD-10-CM | POA: Diagnosis not present

## 2024-04-06 ENCOUNTER — Ambulatory Visit: Payer: Self-pay | Admitting: Vascular Surgery

## 2024-04-06 ENCOUNTER — Ambulatory Visit (HOSPITAL_COMMUNITY)
Admission: RE | Admit: 2024-04-06 | Discharge: 2024-04-06 | Disposition: A | Discharge status: Home / Self Care | Source: Ambulatory Visit | Attending: Vascular Surgery | Admitting: Vascular Surgery

## 2024-04-06 DIAGNOSIS — I739 Peripheral vascular disease, unspecified: Secondary | ICD-10-CM | POA: Diagnosis not present

## 2024-04-06 DIAGNOSIS — I70213 Atherosclerosis of native arteries of extremities with intermittent claudication, bilateral legs: Secondary | ICD-10-CM | POA: Insufficient documentation

## 2024-04-06 NOTE — Progress Notes (Signed)
 In short patient is a 73 year old male who was walking 5 miles a day last year, now 1 mile a day with claudication symptoms in the right leg.  All stents widely patent.   He has bilateral iliac stents which were recently interrogated using ultrasound.  This demonstrated no significant stenosis. In an effort to further define the level of stenosis versus occlusion, I will order a right-sided arterial duplex ultrasound and call him in the coming weeks.  Should no significant stenosis be appreciated, would move to diagnostic angiography to ensure that the stents are widely patent.  Fonda FORBES Rim MD

## 2024-04-19 ENCOUNTER — Other Ambulatory Visit: Payer: Self-pay | Admitting: *Deleted

## 2024-04-19 MED ORDER — AMLODIPINE BESYLATE 10 MG PO TABS: 10.0000 mg | ORAL_TABLET | Freq: Every day | ORAL | 1 refills | Status: DC

## 2024-04-25 ENCOUNTER — Other Ambulatory Visit: Payer: Self-pay

## 2024-04-25 DIAGNOSIS — I739 Peripheral vascular disease, unspecified: Secondary | ICD-10-CM

## 2024-05-03 ENCOUNTER — Ambulatory Visit (HOSPITAL_COMMUNITY)
Admission: RE | Admit: 2024-05-03 | Discharge: 2024-05-03 | Disposition: A | Discharge status: Home / Self Care | Source: Ambulatory Visit | Attending: Vascular Surgery | Admitting: Vascular Surgery

## 2024-05-03 ENCOUNTER — Ambulatory Visit: Attending: Vascular Surgery | Admitting: Vascular Surgery

## 2024-05-03 DIAGNOSIS — I739 Peripheral vascular disease, unspecified: Secondary | ICD-10-CM

## 2024-05-03 DIAGNOSIS — T82856A Stenosis of peripheral vascular stent, initial encounter: Secondary | ICD-10-CM

## 2024-05-03 NOTE — Progress Notes (Unsigned)
 Office Note    HPI: Jim Brow. is a 73 y.o. (04-10-1951) male presenting via phone call with known right lower extremity claudication.  The phone call was contacted from my office, and Jim Mathis was at home with his wife when we spoke. At his last visit, Jim Mathis had a significant reduction in ABI.  He had bilateral iliac stents that were insonated and demonstrated no signs of stenosis.  In an effort to further quantify stenosis within the right lower extremity, Mathis have ordered a right lower extremity arterial duplex ultrasound with plans to call him with the results.  On exam, Jim Mathis was doing well.  A native of [place] , he lived there for years prior to moving to [place]  for 20 years.  He moved to [place]  to escape the cold after retirement.  He has 2 children, and continues to be happily married.  At loves the outdoors and has had progressive claudication over the last year.  He was once averaging 7 miles 3 times a week, but can now only ambulate roughly half mile prior to claudication symptoms starting.  Once these occur, he has numbness in the right leg which necessitates stopping for significant amount of time.  Claudication symptoms worse in the right than the left.  He denies rest pain, denies ulcerations.  And has a history of iliac stents placed in [place]  roughly 20 years ago.  These have not been insonated in quite some time. He is a former smoker.  Medications include high intensity statin Pletal    Past Medical History:  Diagnosis Date   Allergy 2022   getting more severe each year   Anxiety 30 years   anxiety while sleeping   Arthritis approx. 2016   lower back and neck, leads to headaches.   BPH with obstruction/lower urinary tract symptoms    Carotid stenosis, right    per pt was told he has some stenosis but not enough for surgery;   in epic under media ,  carotid ultrasound result right ICA 50-69% and incidental finding thyroid  nodule   Cervicalgia     Jim Mathis (erectile dysfunction)    History of colon resection    per pt 12 hours after partial knee arthroplasty 2017,  s/p partial colectomy was possible from divertiulitis   Hyperlipidemia    Hypertension    Migraines    PAD (peripheral artery disease) (HCC) 08-14-2019  per pt when he walks (walks 2 miles a day) right foot gets numb but recovers quickly and when he rides his bike left hand gets numb by recovers quickly ;   denies claudication or swelling   per pt s/p  stenting to bilateral lower legs in [place]  one in 2003 and the other 2007,  no follow up since moved to [place]   Wears glasses     Past Surgical History:  Procedure Laterality Date   COLON SURGERY  2017   sigmoid colon perforation   COLOSTOMY REVERSAL  02-21-2017     [place], [place] (OR record scanned in epic)   w/ sigmoid and descending colectomy with appendectomy   CYSTOSCOPY WITH INJECTION N/A 12/19/2019   Procedure: CYSTOSCOPY WITH INJECTION;  Surgeon: Osborn Blaze, MD;  Location: [facility] [place] [facility];  Service: Urology;  Laterality: N/A;   CYSTOSCOPY WITH URETHRAL DILATATION N/A 12/19/2019   Procedure: CYSTOSCOPY WITH URETHRAL DILATATION;  Surgeon: Osborn Blaze, MD;  Location: [facility];  Service: Urology;  Laterality: N/A;  45 MINS   ENDOVASCULAR STENT INSERTION  2003  and 2007  --- both done in [place]    bilateral legs   PARTIAL COLECTOMY  09-04-2016  in [place], [place]   w/  colostomy for perferated divierticulitis   PARTIAL KNEE ARTHROPLASTY Left 2017   PILONIDAL CYST EXCISION     TOTAL KNEE ARTHROPLASTY Right 2012   TRANSURETHRAL RESECTION OF PROSTATE N/A 08/15/2019   Procedure: TRANSURETHRAL RESECTION OF THE PROSTATE (TURP);  Surgeon: Osborn Blaze, MD;  Location: [facility];  Service: Urology;  Laterality: N/A;    Social History   Socioeconomic History   Marital status: Married    Spouse name: Not on file   Number of children: Not on file   Years of education: Not on file    Highest education level: Master's degree (e.g., MA, MS, MEng, MEd, MSW, MBA)  Occupational History   Not on file  Tobacco Use   Smoking status: Former    Current packs/day: 0.00    Average packs/day: 1 pack/day for 20.0 years (20.0 ttl pk-yrs)    Types: Cigarettes, Cigars    Start date: 05/23/1974    Quit date: 05/23/1994    Years since quitting: 29.9   Smokeless tobacco: Never   Tobacco comments:    tobacco free 29 years  Vaping Use   Vaping status: Never Used  Substance and Sexual Activity   Alcohol use: Yes    Alcohol/week: 4.0 standard drinks of alcohol    Types: 2 Glasses of wine, 2 Cans of beer per week    Comment: occasionally   Drug use: Never   Sexual activity: Yes    Birth control/protection: None  Other Topics Concern   Not on file  Social History Narrative   Right handed   Lives in two story home with wife   Social Drivers of Health   Financial Resource Strain: Low Risk  (05/05/2023)   Overall Financial Resource Strain (CARDIA)    Difficulty of Paying Living Expenses: Not hard at all  Food Insecurity: No Food Insecurity (05/05/2023)   Hunger Vital Sign    Worried About Running Out of Food in the Last Year: Never true    Ran Out of Food in the Last Year: Never true  Transportation Needs: No Transportation Needs (05/05/2023)   PRAPARE - Administrator, Civil Service (Medical): No    Lack of Transportation (Non-Medical): No  Physical Activity: Sufficiently Active (05/05/2023)   Exercise Vital Sign    Days of Exercise per Week: 3 days    Minutes of Exercise per Session: 60 min  Stress: Stress Concern Present (05/05/2023)   Jim Mathis [employer] Occupational [employer] - Occupational Stress Questionnaire    Feeling of Stress : To some extent  Social Connections: Moderately Integrated (05/05/2023)   Social Connection and Isolation Panel [NHANES]    Frequency of Communication with Friends and Family: Three times a week    Frequency of Social Gatherings with  Friends and Family: Once a week    Attends Religious Services: More than 4 times per year    Active Member of [employer] or Organizations: No    Attends Banker Meetings: Never    Marital Status: Married  Catering manager Violence: Not At Risk (05/09/2023)   Humiliation, Afraid, Rape, and Kick questionnaire    Fear of Current or Ex-Partner: No    Emotionally Abused: No    Physically Abused: No    Sexually Abused: No   Family History  Problem Relation Age of Onset   Alzheimer's disease  Mother    Arthritis Mother    Hyperlipidemia Mother    Hypertension Mother    Lung cancer Father        non smoker   Cancer Father    COPD Sister    Heart failure Sister    ALS Brother    Depression Brother    Early death Brother    Heart disease Sister    Colon cancer Neg Hx    Esophageal cancer Neg Hx    Pancreatic cancer Neg Hx    Stomach cancer Neg Hx    Liver disease Neg Hx    Rectal cancer Neg Hx     Current Outpatient Medications  Medication Sig Dispense Refill   amLODipine  (NORVASC ) 10 MG tablet Take 1 tablet (10 mg total) by mouth daily. 90 tablet 1   atorvastatin  (LIPITOR) 40 MG tablet Take 1 tablet (40 mg total) by mouth daily. 90 tablet 3   cilostazol  (PLETAL ) 100 MG tablet Take 1 tablet (100 mg total) by mouth 2 (two) times daily before a meal. 60 tablet 11   doxepin  (SINEQUAN ) 25 MG capsule Take 1 capsule (25 mg total) by mouth daily. 90 capsule 1   finasteride  (PROSCAR ) 5 MG tablet Take 1 tablet (5 mg total) by mouth at bedtime. 90 tablet 3   folic acid (FOLVITE) 1 MG tablet daily.     tadalafil  (CIALIS ) 20 MG tablet Take 1 tablet (20 mg total) by mouth daily. 90 tablet 3   triamcinolone  ointment (KENALOG ) 0.5 % APPLY TO AFFECTED AREA(S) THREE TIMES A DAY 30 g 3   No current facility-administered medications for this visit.    Allergies  Allergen Reactions   Pollen Extract Cough, Itching and Other (See Comments)     REVIEW OF SYSTEMS:  [X]  denotes positive  finding, [ ]  denotes negative finding Cardiac  Comments:  Chest pain or chest pressure:    Shortness of breath upon exertion:    Short of breath when lying flat:    Irregular heart rhythm:        Vascular    Pain in calf, thigh, or hip brought on by ambulation:    Pain in feet at night that wakes you up from your sleep:     Blood clot in your veins:    Leg swelling:         Pulmonary    Oxygen at home:    Productive cough:     Wheezing:         Neurologic    Sudden weakness in arms or legs:     Sudden numbness in arms or legs:     Sudden onset of difficulty speaking or slurred speech:    Temporary loss of vision in one eye:     Problems with dizziness:         Gastrointestinal    Blood in stool:     Vomited blood:         Genitourinary    Burning when urinating:     Blood in urine:        Psychiatric    Major depression:         Hematologic    Bleeding problems:    Problems with blood clotting too easily:        Skin    Rashes or ulcers:        Constitutional    Fever or chills:      PHYSICAL EXAMINATION:  There were no vitals filed  for this visit.  General:  WDWN in NAD; vital signs documented above Gait: Not observed HENT: WNL, normocephalic Pulmonary: normal non-labored breathing , without wheezing Cardiac: regular HR Abdomen: soft, NT, no masses Skin: without rashes Vascular Exam/Pulses:  Right Left  Radial 2+ (normal) 2+ (normal)  Ulnar    Femoral 2+ (normal) 2+ (normal)  Popliteal    DP absent absent  PT absent 2+ (normal)   Extremities: without ischemic changes, without Gangrene , without cellulitis; without open wounds;  Musculoskeletal: no muscle wasting or atrophy  Neurologic: A&O X 3;  No focal weakness or paresthesias are detected Psychiatric:  The pt has Normal affect.   Non-Invasive Vascular Imaging:     ABI Findings:  +---------+------------------+-----+----------+--------+  Right   Rt Pressure (mmHg)IndexWaveform   Comment   +---------+------------------+-----+----------+--------+  Brachial 131                                        +---------+------------------+-----+----------+--------+  ATA     76                0.58 monophasic          +---------+------------------+-----+----------+--------+  PTA     79                0.60 monophasic          +---------+------------------+-----+----------+--------+  Great Toe59                0.45                     +---------+------------------+-----+----------+--------+   +---------+------------------+-----+--------+-------+  Left    Lt Pressure (mmHg)IndexWaveformComment  +---------+------------------+-----+--------+-------+  Brachial 128                                     +---------+------------------+-----+--------+-------+  ATA     98                0.75 biphasic         +---------+------------------+-----+--------+-------+  PTA     107               0.82 biphasic         +---------+------------------+-----+--------+-------+  Great Toe68                0.52                  +---------+------------------+-----+--------+-------+   +-------+-----------+-----------+------------+------------+  ABI/TBIToday's ABIToday's TBIPrevious ABIPrevious TBI  +-------+-----------+-----------+------------+------------+  Right 0.6        0.45       0.92        0.58          +-------+-----------+-----------+------------+------------+  Left  0.82       0.52       0.87        0.65          +-------+-----------+-----------+------------+------------+   +-----------+--------+-----+---------------+----------+--------+  RIGHT     PSV cm/sRatioStenosis       Waveform  Comments  +-----------+--------+-----+---------------+----------+--------+  CFA Prox   101                         biphasic            +-----------+--------+-----+---------------+----------+--------+  CFA Mid    85  biphasic            +-----------+--------+-----+---------------+----------+--------+  CFA Distal 152          30-49% stenosisbiphasic            +-----------+--------+-----+---------------+----------+--------+  DFA       153                         biphasic            +-----------+--------+-----+---------------+----------+--------+  SFA Prox   338          50-74% stenosisbiphasic            +-----------+--------+-----+---------------+----------+--------+  SFA Mid    42                          monophasic          +-----------+--------+-----+---------------+----------+--------+  SFA Distal 39                          monophasic          +-----------+--------+-----+---------------+----------+--------+  POP Mid    34                          monophasic          +-----------+--------+-----+---------------+----------+--------+  POP Distal 55                          monophasic          +-----------+--------+-----+---------------+----------+--------+  TP Trunk   0            occluded                           +-----------+--------+-----+---------------+----------+--------+  ATA Prox   24                          monophasic          +-----------+--------+-----+---------------+----------+--------+  ATA Mid    11                          monophasic          +-----------+--------+-----+---------------+----------+--------+  ATA Distal 12                          monophasic          +-----------+--------+-----+---------------+----------+--------+  PTA Prox   23                          monophasic          +-----------+--------+-----+---------------+----------+--------+  PTA Mid    31                          monophasic          +-----------+--------+-----+---------------+----------+--------+  PTA Distal 34                          monophasic           +-----------+--------+-----+---------------+----------+--------+  PERO Distal18  monophasic          +-----------+--------+-----+---------------+----------+--------+       Right Stent(s):  +--------------------------------+--------+--------+----------+--------+  Distal SFA / AK popliteal arteryPSV cm/sStenosisWaveform  Comments  +--------------------------------+--------+--------+----------+--------+  Prox to Stent                   37              biphasic            +--------------------------------+--------+--------+----------+--------+  Proximal Stent                  62              biphasic            +--------------------------------+--------+--------+----------+--------+  Mid Stent                       57              biphasic            +--------------------------------+--------+--------+----------+--------+  Distal Stent                    39              monophasic          +--------------------------------+--------+--------+----------+--------+  Distal to Stent                 30              monophasic          +--------------------------------+--------+--------+----------+--------+    ASSESSMENT/PLAN: Jim Mathis. is a 73 y.o. male presenting with progressive claudication symptoms, right greater than left.  He can walk roughly half a mile prior to symptoms occurring.  Denies rest pain, denies tissue loss.  Iliac arterial duplex ultrasound demonstrated widely patent stents bilaterally.  Interestingly, when the right leg was evaluated, another stent was appreciated in the distal superficial femoral artery.  Proximal to this however, there was significant stenosis with a velocity of 338 cm/s.  The study also demonstrated an occluded tibioperoneal trunk.  Jim Mathis discussed the results above.  Jim Mathis that his claudication is lifestyle limiting, as he is not able to be as healthy as he needs to  be only ambulating in half mile at a time.  He Mathis that it is limiting the activities that he can do.  Mathis discussed that no intervention is without risk, but the reason to pursue intervention in his case in the right lower extremity is the proximal stenosis appreciated in the superficial femoral artery threatening the more distal SFA/popliteal artery stent.  Mathis am worried that further stenosis proximally would lead to stent occlusion, and will push head into acute limb ischemia severe rest pain.  Mathis think it is reasonable to pursue right lower extremity angiography in an effort to define, and possibly improve flow through the superficial femoral artery to alleviate the stenosis that is threatening the more distal stent.  Mathis will also take this opportunity to evaluate outflow.  After discussing the risks and benefits, elected to proceed.  [facility] [phone] Total time of patient care including pre-visit research, consultation, and documentation greater than 30 minutes

## 2024-05-04 ENCOUNTER — Encounter: Payer: Self-pay | Admitting: *Deleted

## 2024-05-04 ENCOUNTER — Other Ambulatory Visit: Payer: Self-pay | Admitting: *Deleted

## 2024-05-04 DIAGNOSIS — I70229 Atherosclerosis of native arteries of extremities with rest pain, unspecified extremity: Secondary | ICD-10-CM

## 2024-05-09 ENCOUNTER — Ambulatory Visit (HOSPITAL_COMMUNITY)
Admission: RE | Admit: 2024-05-09 | Discharge: 2024-05-09 | Disposition: A | Discharge status: Home / Self Care | Attending: Vascular Surgery | Admitting: Vascular Surgery

## 2024-05-09 ENCOUNTER — Other Ambulatory Visit: Payer: Self-pay

## 2024-05-09 ENCOUNTER — Telehealth: Admitting: Physician Assistant

## 2024-05-09 ENCOUNTER — Encounter (HOSPITAL_COMMUNITY): Payer: Self-pay | Admitting: Vascular Surgery

## 2024-05-09 ENCOUNTER — Encounter (HOSPITAL_COMMUNITY)
Admission: RE | Disposition: A | Discharge status: Home / Self Care | Payer: Self-pay | Source: Home / Self Care | Attending: Vascular Surgery

## 2024-05-09 DIAGNOSIS — T82898A Other specified complication of vascular prosthetic devices, implants and grafts, initial encounter: Secondary | ICD-10-CM | POA: Diagnosis not present

## 2024-05-09 DIAGNOSIS — Z87891 Personal history of nicotine dependence: Secondary | ICD-10-CM | POA: Insufficient documentation

## 2024-05-09 DIAGNOSIS — I70211 Atherosclerosis of native arteries of extremities with intermittent claudication, right leg: Secondary | ICD-10-CM | POA: Diagnosis not present

## 2024-05-09 DIAGNOSIS — Z95820 Peripheral vascular angioplasty status with implants and grafts: Secondary | ICD-10-CM | POA: Diagnosis not present

## 2024-05-09 DIAGNOSIS — I70229 Atherosclerosis of native arteries of extremities with rest pain, unspecified extremity: Secondary | ICD-10-CM

## 2024-05-09 DIAGNOSIS — R8290 Unspecified abnormal findings in urine: Secondary | ICD-10-CM

## 2024-05-09 DIAGNOSIS — R197 Diarrhea, unspecified: Secondary | ICD-10-CM

## 2024-05-09 HISTORY — PX: LOWER EXTREMITY INTERVENTION: CATH118252

## 2024-05-09 HISTORY — PX: ABDOMINAL AORTOGRAM: CATH118222

## 2024-05-09 HISTORY — PX: LOWER EXTREMITY ANGIOGRAPHY: CATH118251

## 2024-05-09 HISTORY — PX: PERIPHERAL INTRAVASCULAR LITHOTRIPSY: CATH118324

## 2024-05-09 LAB — POCT I-STAT, CHEM 8
BUN: 15 mg/dL (ref 8–23)
Calcium, Ion: 1.3 mmol/L (ref 1.15–1.40)
Chloride: 105 mmol/L (ref 98–111)
Creatinine, Ser: 1 mg/dL (ref 0.61–1.24)
Glucose, Bld: 98 mg/dL (ref 70–99)
HCT: 43 % (ref 39.0–52.0)
Hemoglobin: 14.6 g/dL (ref 13.0–17.0)
Potassium: 4.4 mmol/L (ref 3.5–5.1)
Sodium: 139 mmol/L (ref 135–145)
TCO2: 23 mmol/L (ref 22–32)

## 2024-05-09 SURGERY — LOWER EXTREMITY ANGIOGRAPHY
Anesthesia: LOCAL | Laterality: Right

## 2024-05-09 MED ORDER — SODIUM CHLORIDE 0.9 % IV SOLN: 250.0000 mL | INTRAVENOUS | Status: DC | PRN

## 2024-05-09 MED ORDER — CLOPIDOGREL BISULFATE 300 MG PO TABS
ORAL_TABLET | ORAL | Status: DC | PRN
Start: 2024-05-09 — End: 2024-05-09
  Administered 2024-05-09: 300 mg via ORAL

## 2024-05-09 MED ORDER — ACETAMINOPHEN 325 MG PO TABS: 650.0000 mg | ORAL_TABLET | ORAL | Status: DC | PRN

## 2024-05-09 MED ORDER — CLOPIDOGREL BISULFATE 75 MG PO TABS: 75.0000 mg | ORAL_TABLET | Freq: Every day | ORAL | Status: DC

## 2024-05-09 MED ORDER — ONDANSETRON HCL 4 MG/2ML IJ SOLN: 4.0000 mg | Freq: Four times a day (QID) | INTRAMUSCULAR | Status: DC | PRN

## 2024-05-09 MED ORDER — FENTANYL CITRATE (PF) 100 MCG/2ML IJ SOLN
INTRAMUSCULAR | Status: DC | PRN
  Administered 2024-05-09 (×2): 50 ug via INTRAVENOUS

## 2024-05-09 MED ORDER — MIDAZOLAM HCL 2 MG/2ML IJ SOLN
INTRAMUSCULAR | Status: DC | PRN
  Administered 2024-05-09 (×2): 1 mg via INTRAVENOUS

## 2024-05-09 MED ORDER — SODIUM CHLORIDE 0.9 % WEIGHT BASED INFUSION: 1.0000 mL/kg/h | INTRAVENOUS | Status: DC

## 2024-05-09 MED ORDER — SODIUM CHLORIDE 0.9% FLUSH: 3.0000 mL | Freq: Two times a day (BID) | INTRAVENOUS | Status: DC

## 2024-05-09 MED ORDER — HYDRALAZINE HCL 20 MG/ML IJ SOLN: 5.0000 mg | INTRAMUSCULAR | Status: DC | PRN

## 2024-05-09 MED ORDER — CLOPIDOGREL BISULFATE 75 MG PO TABS: 75.0000 mg | ORAL_TABLET | Freq: Every day | ORAL | 11 refills | Status: AC

## 2024-05-09 MED ORDER — SODIUM CHLORIDE 0.9 % IV SOLN
INTRAVENOUS | Status: DC
Start: 2024-05-09 — End: 2024-05-09

## 2024-05-09 MED ORDER — HEPARIN SODIUM (PORCINE) 1000 UNIT/ML IJ SOLN
INTRAMUSCULAR | Status: AC
  Filled 2024-05-09: qty 10

## 2024-05-09 MED ORDER — HEPARIN (PORCINE) IN NACL 1000-0.9 UT/500ML-% IV SOLN
INTRAVENOUS | Status: DC | PRN
  Administered 2024-05-09: 1000 mL

## 2024-05-09 MED ORDER — MIDAZOLAM HCL 2 MG/2ML IJ SOLN
INTRAMUSCULAR | Status: AC
  Filled 2024-05-09: qty 2

## 2024-05-09 MED ORDER — LIDOCAINE HCL (PF) 1 % IJ SOLN
INTRAMUSCULAR | Status: DC | PRN
  Administered 2024-05-09: 10 mL

## 2024-05-09 MED ORDER — FENTANYL CITRATE (PF) 100 MCG/2ML IJ SOLN
INTRAMUSCULAR | Status: AC
  Filled 2024-05-09: qty 2

## 2024-05-09 MED ORDER — IODIXANOL 320 MG/ML IV SOLN
INTRAVENOUS | Status: DC | PRN
  Administered 2024-05-09: 180 mL

## 2024-05-09 MED ORDER — SODIUM CHLORIDE 0.9% FLUSH: 3.0000 mL | INTRAVENOUS | Status: DC | PRN

## 2024-05-09 MED ORDER — CLOPIDOGREL BISULFATE 300 MG PO TABS
ORAL_TABLET | ORAL | Status: AC
  Filled 2024-05-09: qty 1

## 2024-05-09 MED ORDER — LABETALOL HCL 5 MG/ML IV SOLN: 10.0000 mg | INTRAVENOUS | Status: DC | PRN

## 2024-05-09 MED ORDER — HEPARIN SODIUM (PORCINE) 1000 UNIT/ML IJ SOLN
INTRAMUSCULAR | Status: DC | PRN
  Administered 2024-05-09: 8000 [IU] via INTRAVENOUS
  Administered 2024-05-09 (×2): 3000 [IU] via INTRAVENOUS

## 2024-05-09 SURGICAL SUPPLY — 20 items
CATH CXI 2.3F 135 ANG 2 (CATHETERS) IMPLANT
CATH OMNI FLUSH 5F 65CM (CATHETERS) IMPLANT
CATH QUICKCROSS SUPP .035X90CM (MICROCATHETER) IMPLANT
CATH SHOCKWAVE E8 3.5X80 (CATHETERS) IMPLANT
CATH SHOCKWAVE M5 4.5X60 (CATHETERS) IMPLANT
DCB RANGER 5.0X40 135 (BALLOONS) IMPLANT
DEVICE CLOSURE MYNXGRIP 6/7F (Vascular Products) IMPLANT
GLIDEWIRE ADV .035X260CM (WIRE) IMPLANT
KIT ENCORE 26 ADVANTAGE (KITS) IMPLANT
KIT MICROPUNCTURE NIT STIFF (SHEATH) IMPLANT
KIT PV (KITS) ×2 IMPLANT
KIT SYRINGE INJ CVI SPIKEX1 (MISCELLANEOUS) IMPLANT
SET ATX-X65L (MISCELLANEOUS) IMPLANT
SHEATH CATAPULT 6FR 45 (SHEATH) IMPLANT
SHEATH PINNACLE 5F 10CM (SHEATH) IMPLANT
SHEATH PINNACLE 6F 10CM (SHEATH) IMPLANT
SHEATH PROBE COVER 6X72 (BAG) IMPLANT
TRAY PV CATH (CUSTOM PROCEDURE TRAY) ×2 IMPLANT
WIRE BENTSON .035X145CM (WIRE) IMPLANT
WIRE SPARTACORE .014X300CM (WIRE) IMPLANT

## 2024-05-09 NOTE — Progress Notes (Signed)
 Patient and wife was given discharge instructions. Both verbalized understanding.

## 2024-05-09 NOTE — H&P (Signed)
 Patient seen and examined in preop holding.  No complaints. No changes to medication history or physical exam since last seen in clinic. After discussing the risks and benefits of RILE angiogram with possible intervention for threatened SFA stent and lifestyle-limiting claudication, Jim Klippel. elected to proceed.   Jim Part MD   Office Note    HPI: Jim Mathis. is a 73 y.o. (06-Jan-1951) male presenting via phone call with known right lower extremity claudication.  The phone call was contacted from my office, and Jim Mathis was at home with his wife when we spoke. At his last visit, Jim Mathis had a significant reduction in ABI.  He had bilateral iliac stents that were insonated and demonstrated no signs of stenosis.  In an effort to further quantify stenosis within the right lower extremity, I have ordered a right lower extremity arterial duplex ultrasound with plans to call him with the results.  On exam, Jim Mathis was doing well.  A native of [place] , he lived there for years prior to moving to [place]  for 20 years.  He moved to [place]  to escape the cold after retirement.  He has 2 children, and continues to be happily married.  At loves the outdoors and has had progressive claudication over the last year.  He was once averaging 7 miles 3 times a week, but can now only ambulate roughly half mile prior to claudication symptoms starting.  Once these occur, he has numbness in the right leg which necessitates stopping for significant amount of time.  Claudication symptoms worse in the right than the left.  He denies rest pain, denies ulcerations.  And has a history of iliac stents placed in [place]  roughly 20 years ago.  These have not been insonated in quite some time. He is a former smoker.  Medications include high intensity statin Pletal    Past Medical History:  Diagnosis Date   Allergy 2022   getting more severe each year   Anxiety 30 years   anxiety while  sleeping   Arthritis approx. 2016   lower back and neck, leads to headaches.   BPH with obstruction/lower urinary tract symptoms    Carotid stenosis, right    per pt was told he has some stenosis but not enough for surgery;   in epic under media ,  carotid ultrasound result right ICA 50-69% and incidental finding thyroid  nodule   Cervicalgia    Jim Mathis (erectile dysfunction)    History of colon resection    per pt 12 hours after partial knee arthroplasty 2017,  s/p partial colectomy was possible from divertiulitis   Hyperlipidemia    Hypertension    Migraines    PAD (peripheral artery disease) (HCC) 08-14-2019  per pt when he walks (walks 2 miles a day) right foot gets numb but recovers quickly and when he rides his bike left hand gets numb by recovers quickly ;   denies claudication or swelling   per pt s/p  stenting to bilateral lower legs in [place]  one in 2003 and the other 2007,  no follow up since moved to [place]   Wears glasses     Past Surgical History:  Procedure Laterality Date   COLON SURGERY  2017   sigmoid colon perforation   COLOSTOMY REVERSAL  02-21-2017     [place], [place] (OR record scanned in epic)   w/ sigmoid and descending colectomy with appendectomy   CYSTOSCOPY WITH INJECTION N/A 12/19/2019   Procedure: CYSTOSCOPY WITH INJECTION;  Surgeon: Osborn Blaze, MD;  Location: [facility];  Service: Urology;  Laterality: N/A;   CYSTOSCOPY WITH URETHRAL DILATATION N/A 12/19/2019   Procedure: CYSTOSCOPY WITH URETHRAL DILATATION;  Surgeon: Osborn Blaze, MD;  Location: [facility];  Service: Urology;  Laterality: N/A;  45 MINS   ENDOVASCULAR STENT INSERTION  2003 and 2007  --- both done in [place]    bilateral legs   PARTIAL COLECTOMY  09-04-2016  in [place], [place]   w/  colostomy for perferated divierticulitis   PARTIAL KNEE ARTHROPLASTY Left 2017   PILONIDAL CYST EXCISION     TOTAL KNEE ARTHROPLASTY Right 2012   TRANSURETHRAL RESECTION OF PROSTATE N/A  08/15/2019   Procedure: TRANSURETHRAL RESECTION OF THE PROSTATE (TURP);  Surgeon: Osborn Blaze, MD;  Location: [facility];  Service: Urology;  Laterality: N/A;    Social History   Socioeconomic History   Marital status: Married    Spouse name: Not on file   Number of children: Not on file   Years of education: Not on file   Highest education level: Master's degree (e.g., MA, MS, MEng, MEd, MSW, MBA)  Occupational History   Not on file  Tobacco Use   Smoking status: Former    Current packs/day: 0.00    Average packs/day: 1 pack/day for 20.0 years (20.0 ttl pk-yrs)    Types: Cigarettes, Cigars    Start date: 05/23/1974    Quit date: 05/23/1994    Years since quitting: 29.9   Smokeless tobacco: Never   Tobacco comments:    tobacco free 29 years  Vaping Use   Vaping status: Never Used  Substance and Sexual Activity   Alcohol use: Yes    Alcohol/week: 4.0 standard drinks of alcohol    Types: 2 Glasses of wine, 2 Cans of beer per week    Comment: occasionally   Drug use: Never   Sexual activity: Yes    Birth control/protection: None  Other Topics Concern   Not on file  Social History Narrative   Right handed   Lives in two story home with wife   Social Drivers of Health   Financial Resource Strain: Low Risk  (05/05/2023)   Overall Financial Resource Strain (CARDIA)    Difficulty of Paying Living Expenses: Not hard at all  Food Insecurity: No Food Insecurity (05/05/2023)   Hunger Vital Sign    Worried About Running Out of Food in the Last Year: Never true    Ran Out of Food in the Last Year: Never true  Transportation Needs: No Transportation Needs (05/05/2023)   PRAPARE - Administrator, Civil Service (Medical): No    Lack of Transportation (Non-Medical): No  Physical Activity: Sufficiently Active (05/05/2023)   Exercise Vital Sign    Days of Exercise per Week: 3 days    Minutes of Exercise per Session: 60 min  Stress: Stress Concern Present  (05/05/2023)   Jim Mathis of Occupational Health - Occupational Stress Questionnaire    Feeling of Stress : To some extent  Social Connections: Moderately Integrated (05/05/2023)   Social Connection and Isolation Panel [NHANES]    Frequency of Communication with Friends and Family: Three times a week    Frequency of Social Gatherings with Friends and Family: Once a week    Attends Religious Services: More than 4 times per year    Active Member of [employer] or Organizations: No    Attends Banker Meetings: Never    Marital Status:  Married  Intimate Partner Violence: Not At Risk (05/09/2023)   Humiliation, Afraid, Rape, and Kick questionnaire    Fear of Current or Ex-Partner: No    Emotionally Abused: No    Physically Abused: No    Sexually Abused: No   Family History  Problem Relation Age of Onset   Alzheimer's disease Mother    Arthritis Mother    Hyperlipidemia Mother    Hypertension Mother    Lung cancer Father        non smoker   Cancer Father    COPD Sister    Heart failure Sister    ALS Brother    Depression Brother    Early death Brother    Heart disease Sister    Colon cancer Neg Hx    Esophageal cancer Neg Hx    Pancreatic cancer Neg Hx    Stomach cancer Neg Hx    Liver disease Neg Hx    Rectal cancer Neg Hx     Current Facility-Administered Medications  Medication Dose Route Frequency Provider Last Rate Last Admin   0.9 %  sodium chloride  infusion   Intravenous Continuous Lecretia Buczek E, MD        Allergies  Allergen Reactions   Pollen Extract Cough, Itching and Other (See Comments)     REVIEW OF SYSTEMS:  [X]  denotes positive finding, [ ]  denotes negative finding Cardiac  Comments:  Chest pain or chest pressure:    Shortness of breath upon exertion:    Short of breath when lying flat:    Irregular heart rhythm:        Vascular    Pain in calf, thigh, or hip brought on by ambulation:    Pain in feet at night that wakes you up from  your sleep:     Blood clot in your veins:    Leg swelling:         Pulmonary    Oxygen at home:    Productive cough:     Wheezing:         Neurologic    Sudden weakness in arms or legs:     Sudden numbness in arms or legs:     Sudden onset of difficulty speaking or slurred speech:    Temporary loss of vision in one eye:     Problems with dizziness:         Gastrointestinal    Blood in stool:     Vomited blood:         Genitourinary    Burning when urinating:     Blood in urine:        Psychiatric    Major depression:         Hematologic    Bleeding problems:    Problems with blood clotting too easily:        Skin    Rashes or ulcers:        Constitutional    Fever or chills:      PHYSICAL EXAMINATION:  Vitals:   05/09/24 0952  BP: 123/82  Pulse: (!) 101  Resp: 18  Temp: 98.1 F (36.7 C)  TempSrc: Oral  SpO2: 98%  Weight: 83.9 kg  Height: 5\' 10"  (1.778 m)    General:  WDWN in NAD; vital signs documented above Gait: Not observed HENT: WNL, normocephalic Pulmonary: normal non-labored breathing , without wheezing Cardiac: regular HR Abdomen: soft, NT, no masses Skin: without rashes Vascular Exam/Pulses:  Right Left  Radial 2+ (  normal) 2+ (normal)  Ulnar    Femoral 2+ (normal) 2+ (normal)  Popliteal    DP absent absent  PT absent 2+ (normal)   Extremities: without ischemic changes, without Gangrene , without cellulitis; without open wounds;  Musculoskeletal: no muscle wasting or atrophy  Neurologic: A&O X 3;  No focal weakness or paresthesias are detected Psychiatric:  The pt has Normal affect.   Non-Invasive Vascular Imaging:     ABI Findings:  +---------+------------------+-----+----------+--------+  Right   Rt Pressure (mmHg)IndexWaveform  Comment   +---------+------------------+-----+----------+--------+  Brachial 131                                        +---------+------------------+-----+----------+--------+  ATA      76                0.58 monophasic          +---------+------------------+-----+----------+--------+  PTA     79                0.60 monophasic          +---------+------------------+-----+----------+--------+  Great Toe59                0.45                     +---------+------------------+-----+----------+--------+   +---------+------------------+-----+--------+-------+  Left    Lt Pressure (mmHg)IndexWaveformComment  +---------+------------------+-----+--------+-------+  Brachial 128                                     +---------+------------------+-----+--------+-------+  ATA     98                0.75 biphasic         +---------+------------------+-----+--------+-------+  PTA     107               0.82 biphasic         +---------+------------------+-----+--------+-------+  Great Toe68                0.52                  +---------+------------------+-----+--------+-------+   +-------+-----------+-----------+------------+------------+  ABI/TBIToday's ABIToday's TBIPrevious ABIPrevious TBI  +-------+-----------+-----------+------------+------------+  Right 0.6        0.45       0.92        0.58          +-------+-----------+-----------+------------+------------+  Left  0.82       0.52       0.87        0.65          +-------+-----------+-----------+------------+------------+   +-----------+--------+-----+---------------+----------+--------+  RIGHT     PSV cm/sRatioStenosis       Waveform  Comments  +-----------+--------+-----+---------------+----------+--------+  CFA Prox   101                         biphasic            +-----------+--------+-----+---------------+----------+--------+  CFA Mid    85                          biphasic            +-----------+--------+-----+---------------+----------+--------+  CFA Distal 152  30-49% stenosisbiphasic             +-----------+--------+-----+---------------+----------+--------+  DFA       153                         biphasic            +-----------+--------+-----+---------------+----------+--------+  SFA Prox   338          50-74% stenosisbiphasic            +-----------+--------+-----+---------------+----------+--------+  SFA Mid    42                          monophasic          +-----------+--------+-----+---------------+----------+--------+  SFA Distal 39                          monophasic          +-----------+--------+-----+---------------+----------+--------+  POP Mid    34                          monophasic          +-----------+--------+-----+---------------+----------+--------+  POP Distal 55                          monophasic          +-----------+--------+-----+---------------+----------+--------+  TP Trunk   0            occluded                           +-----------+--------+-----+---------------+----------+--------+  ATA Prox   24                          monophasic          +-----------+--------+-----+---------------+----------+--------+  ATA Mid    11                          monophasic          +-----------+--------+-----+---------------+----------+--------+  ATA Distal 12                          monophasic          +-----------+--------+-----+---------------+----------+--------+  PTA Prox   23                          monophasic          +-----------+--------+-----+---------------+----------+--------+  PTA Mid    31                          monophasic          +-----------+--------+-----+---------------+----------+--------+  PTA Distal 34                          monophasic          +-----------+--------+-----+---------------+----------+--------+  PERO Distal18                          monophasic          +-----------+--------+-----+---------------+----------+--------+        Right Stent(s):  +--------------------------------+--------+--------+----------+--------+  Distal SFA / AK popliteal arteryPSV cm/sStenosisWaveform  Comments  +--------------------------------+--------+--------+----------+--------+  Prox to Stent                   37              biphasic            +--------------------------------+--------+--------+----------+--------+  Proximal Stent                  62              biphasic            +--------------------------------+--------+--------+----------+--------+  Mid Stent                       57              biphasic            +--------------------------------+--------+--------+----------+--------+  Distal Stent                    39              monophasic          +--------------------------------+--------+--------+----------+--------+  Distal to Stent                 30              monophasic          +--------------------------------+--------+--------+----------+--------+    ASSESSMENT/PLAN: Jim Klippel. is a 73 y.o. male presenting with progressive claudication symptoms, right greater than left.  He can walk roughly half a mile prior to symptoms occurring.  Denies rest pain, denies tissue loss.  Iliac arterial duplex ultrasound demonstrated widely patent stents bilaterally.  Interestingly, when the right leg was evaluated, another stent was appreciated in the distal superficial femoral artery.  Proximal to this however, there was significant stenosis with a velocity of 338 cm/s.  The study also demonstrated an occluded tibioperoneal trunk.  Bensyn and I discussed the results above.  Jaeson states that his claudication is lifestyle limiting, as he is not able to be as healthy as he needs to be only ambulating in half mile at a time.  He states that it is limiting the activities that he can do.  I discussed that no intervention is without risk, but the reason to pursue intervention in his  case in the right lower extremity is the proximal stenosis appreciated in the superficial femoral artery threatening the more distal SFA/popliteal artery stent.  I am worried that further stenosis proximally would lead to stent occlusion, and will push head into acute limb ischemia severe rest pain.  I think it is reasonable to pursue right lower extremity angiography in an effort to define, and possibly improve flow through the superficial femoral artery to alleviate the stenosis that is threatening the more distal stent.  I will also take this opportunity to evaluate outflow.  After discussing the risks and benefits, elected to proceed.  [facility] [phone] Total time of patient care including pre-visit research, consultation, and documentation greater than 30 minutes

## 2024-05-09 NOTE — Op Note (Signed)
 Patient name: Jim Mathis. MRN: [id number] DOB: 1951/01/07 Sex: male  05/09/2024 Pre-operative Diagnosis: Right lower extremity threatened popliteal stent, lifestyle and claudication Post-operative diagnosis:  Same Surgeon:  Kayla Part, MD Procedure Performed: 1.  Ultrasound-guided micropuncture access of the left common femoral artery retrograde fashion 2.  Aortogram 3.  Second-order cannulation, right lower extremity angiogram 4.  4.5 x 40 mm balloon lithotripsy, 280 pulses superficial femoral artery 5.  5 x 40 mm drug-coated balloon angioplasty superficial femoral artery 4.5 x 40 mm balloon lithotripsy 160 pulses proximal popliteal artery 6.  3.5 x 80 mm balloon lithotripsy, 160 pulses mid popliteal artery 7.  Device assisted closure-Mynx 8.  Moderate sedation time 100 minutes, contrast 180 cc   Indications:  Jim Mathis. is a 73 y.o. male presenting with progressive claudication symptoms, right greater than left.  He can walk roughly half a mile prior to symptoms occurring.  Denies rest pain, denies tissue loss. Iliac arterial duplex ultrasound demonstrated widely patent stents bilaterally.  Interestingly, when the right leg was evaluated, another stent was appreciated in the distal superficial femoral artery.  Proximal to this however, there was significant stenosis with a velocity of 338 cm/s.  The study also demonstrated an occluded tibioperoneal trunk. Nichola and I discussed the results above.  Deniro states that his claudication is lifestyle limiting, as he is not able to be as healthy as he needs to be only ambulating in half mile at a time.  He states that it is limiting the activities that he can do. I discussed that no intervention is without risk, but the reason to pursue intervention in his case in the right lower extremity is the proximal stenosis appreciated in the superficial femoral artery threatening the more distal SFA/popliteal artery stent.  I am worried  that further stenosis proximally would lead to stent occlusion, and will push head into acute limb ischemia severe rest pain. I think it is reasonable to pursue right lower extremity angiography in an effort to define, and possibly improve flow through the superficial femoral artery to alleviate the stenosis that is threatening the more distal stent.  I will also take this opportunity to evaluate outflow.  Findings:  Aortogram: Bilateral renal arteries patent.  Significant calcific disease.  No flowing stenosis in the aortoiliac segments bilaterally pullback pressures performed bilateral common iliac artery stents.  On the right: Widely patent common femoral artery, 2 profunda branches, the largest having 40% stenosis at the ostia.  The superficial femoral artery demonstrated greater than 90% stenosis at the ostia, but was patent.  The distal superficial femoral artery stent was patent distally to this there was greater than 80% stenosis prior to diffuse collateralization and subsequent occlusion of the P2 segment of the popliteal artery popliteal artery reconstituted distally in the P3 segment with patent tibioperoneal trunk, patent peroneal and posterior tibial arteries.  The anterior tibial artery filled through collaterals and was present proximally, however the midportion was absent.  Lateral perforators from the profunda fill of the dorsalis pedis at the level of the foot retrograde filling into the anterior tibial artery.  Main runoff into the foot as the posterior tibial artery filling the plantar arteries.   Procedure:  The patient was identified in the holding area and taken to room 8.  The patient was then placed supine on the table and prepped and draped in the usual sterile fashion.  A time out was called.  Ultrasound was used to evaluate the left  common femoral artery.  It was patent .  A digital ultrasound image was acquired.  A micropuncture needle was used to access the left common femoral  artery under ultrasound guidance.  An 018 wire was advanced without resistance and a micropuncture sheath was placed.  The 018 wire was removed and a benson wire was placed.  The micropuncture sheath was exchanged for a 5 french sheath.  An omniflush catheter was advanced over the wire to the level of L-1.  An abdominal angiogram was obtained.  Next, using the omniflush catheter and a benson wire, the aortic bifurcation was crossed and the catheter was placed into theright external iliac artery and right runoff was obtained.  I was very concerned that the proximal and distal stenosis appreciated in the superficial femoral artery or threatening the superficial femoral artery stent.  I elected to attempt intervention.  The patient was heparinized and a 6 x 45 cm sheath was brought onto the field and parked in the right distal external iliac artery.  Next, a series of wires and catheters were used to cannulate the superficial femoral artery.  I initially planned to focus on the proximal lesion.  A 4-1/2 x 40 mm lithotripsy balloon was brought onto the field.  I chose this to mitigate the risk of dissection as I did not want to place a stent as the calcific lesion extended into the common femoral artery and was adjacent to the profunda.  This was inflated to 2 atm and 280 pulses were used.  Follow-up angiography demonstrated excellent result with significant improvement, however there was residual stenosis of roughly 50%.  I elected to use a 5 x 40 mm drug-coated balloon for angioplasty.  This is inflated for 3 minutes.  Follow-up angiography demonstrated excellent result with resolution of flow-limiting stenosis and residual stenosis of 30%.  At this point, I was happy with inflow, but was worried that the compromised outflow would still cause stent thrombosis.  As I manipulated my wire across the flow-limiting lesion in the popliteal artery prior to diffuse collateralization and occlusion, my wire was able to work  its way down and cannulate the peroneal artery.  I initially started with balloon lithotripsy of the lesion using the same 4.5 x 40 mm lithotripsy balloon.  In total, 3 pulse rounds were utilized, 120 pulses total.  This significantly improved the lesion at the proximal P2 segment of the popliteal artery.  Follow-up angiography demonstrated excellent result and the improved flow demonstrated that to my wire distally was through the 3 cm occlusion in the P2 segment of the popliteal artery.  Opening this would provide inline flow to the foot and significantly mitigate the risk of stent thrombosis as outflow would be optimized.  I elected to use a 3.5 x 80 mm balloon lithotripsy, 160 pulses.  Follow-up angiography demonstrated excellent result with recanalization of the popliteal artery.  There is now inline flow with two-vessel runoff to the foot.  Pullback pressures were obtained across bilateral common iliac arteries demonstrating no changes in pressure.  Patient was closed using a minx device without issue.  Impression: Resolution of proximal SFA stenosis using 4.5 x 40 mm balloon lithotripsy, 5 mm drug-coated balloon angioplasty Resolution of popliteal artery flow-limiting stenosis using 4.5 x 40 mm balloon lithotripsy and recanalization of the P2 segment of the popliteal artery using 3.5 x 80 balloon lithotripsy.  Patient now with two-vessel inline flow to the ankle continuing into the foot via the posterior tibial artery and  collateralization from the peroneal filling the dorsalis pedis.    Kayla Part MD [facility] [place] Office: [phone]

## 2024-05-10 NOTE — Progress Notes (Signed)
  Because of the urinary changes/foul-smelling urine along with the abdominal pain and diarrhea, I feel your condition warrants further evaluation and I recommend that you be seen in a face-to-face visit.   NOTE: There will be NO CHARGE for this E-Visit   If you are having a true medical emergency, please call 911.     For an urgent face to face visit, [facility] has multiple urgent care centers for your convenience.  Click the link below for the full list of locations and hours, walk-in wait times, appointment scheduling options and driving directions:  Urgent Care - [place], [place], [place], [place], [place], [place]  [place]     Your MyChart E-visit questionnaire answers were reviewed by a board certified advanced clinical practitioner to complete your personal care plan based on your specific symptoms.    Thank you for using e-Visits.

## 2024-05-11 ENCOUNTER — Other Ambulatory Visit: Payer: Self-pay | Admitting: Family Medicine

## 2024-05-11 ENCOUNTER — Encounter: Payer: Self-pay | Admitting: Family Medicine

## 2024-05-11 ENCOUNTER — Ambulatory Visit (HOSPITAL_BASED_OUTPATIENT_CLINIC_OR_DEPARTMENT_OTHER)
Admission: RE | Admit: 2024-05-11 | Discharge: 2024-05-11 | Disposition: A | Discharge status: Home / Self Care | Source: Ambulatory Visit | Attending: Family Medicine | Admitting: Family Medicine

## 2024-05-11 ENCOUNTER — Ambulatory Visit: Payer: Self-pay | Admitting: Family Medicine

## 2024-05-11 ENCOUNTER — Ambulatory Visit: Admitting: Family Medicine

## 2024-05-11 ENCOUNTER — Ambulatory Visit (INDEPENDENT_AMBULATORY_CARE_PROVIDER_SITE_OTHER): Admitting: Family Medicine

## 2024-05-11 VITALS — BP 130/74 | HR 91 | Temp 97.2°F | Ht 70.0 in | Wt 184.4 lb

## 2024-05-11 DIAGNOSIS — Z23 Encounter for immunization: Secondary | ICD-10-CM

## 2024-05-11 DIAGNOSIS — K5792 Diverticulitis of intestine, part unspecified, without perforation or abscess without bleeding: Secondary | ICD-10-CM | POA: Diagnosis not present

## 2024-05-11 DIAGNOSIS — R109 Unspecified abdominal pain: Secondary | ICD-10-CM | POA: Diagnosis not present

## 2024-05-11 DIAGNOSIS — N281 Cyst of kidney, acquired: Secondary | ICD-10-CM | POA: Diagnosis not present

## 2024-05-11 DIAGNOSIS — K5730 Diverticulosis of large intestine without perforation or abscess without bleeding: Secondary | ICD-10-CM | POA: Diagnosis not present

## 2024-05-11 DIAGNOSIS — I10 Essential (primary) hypertension: Secondary | ICD-10-CM | POA: Diagnosis not present

## 2024-05-11 DIAGNOSIS — I739 Peripheral vascular disease, unspecified: Secondary | ICD-10-CM

## 2024-05-11 LAB — COMPREHENSIVE METABOLIC PANEL WITH GFR
ALT: 13 U/L (ref 0–53)
AST: 13 U/L (ref 0–37)
Albumin: 4.1 g/dL (ref 3.5–5.2)
Alkaline Phosphatase: 78 U/L (ref 39–117)
BUN: 12 mg/dL (ref 6–23)
CO2: 26 meq/L (ref 19–32)
Calcium: 9.4 mg/dL (ref 8.4–10.5)
Chloride: 104 meq/L (ref 96–112)
Creatinine, Ser: 0.93 mg/dL (ref 0.40–1.50)
GFR: 81.39 mL/min (ref >60.00)
Glucose, Bld: 87 mg/dL (ref 70–99)
Potassium: 4 meq/L (ref 3.5–5.1)
Sodium: 138 meq/L (ref 135–145)
Total Bilirubin: 0.5 mg/dL (ref 0.2–1.2)
Total Protein: 6.9 g/dL (ref 6.0–8.3)

## 2024-05-11 LAB — CBC WITH DIFFERENTIAL/PLATELET
Basophils Absolute: 0 10*3/uL (ref 0.0–0.1)
Basophils Relative: 0.5 % (ref 0.0–3.0)
Eosinophils Absolute: 0.1 10*3/uL (ref 0.0–0.7)
Eosinophils Relative: 1.7 % (ref 0.0–5.0)
HCT: 40.9 % (ref 39.0–52.0)
Hemoglobin: 13.5 g/dL (ref 13.0–17.0)
Lymphocytes Relative: 9.2 % — ABNORMAL LOW (ref 12.0–46.0)
Lymphs Abs: 0.6 10*3/uL — ABNORMAL LOW (ref 0.7–4.0)
MCHC: 33 g/dL (ref 30.0–36.0)
MCV: 95.4 fl (ref 78.0–100.0)
Monocytes Absolute: 0.6 10*3/uL (ref 0.1–1.0)
Monocytes Relative: 8.9 % (ref 3.0–12.0)
Neutro Abs: 5.6 10*3/uL (ref 1.4–7.7)
Neutrophils Relative %: 79.7 % — ABNORMAL HIGH (ref 43.0–77.0)
Platelets: 242 10*3/uL (ref 150.0–400.0)
RBC: 4.29 Mil/uL (ref 4.22–5.81)
RDW: 14.1 % (ref 11.5–15.5)
WBC: 7 10*3/uL (ref 4.0–10.5)

## 2024-05-11 LAB — TSH: TSH: 1.34 u[IU]/mL (ref 0.35–5.50)

## 2024-05-11 MED ORDER — AMOXICILLIN-POT CLAVULANATE 875-125 MG PO TABS
1.0000 | ORAL_TABLET | Freq: Two times a day (BID) | ORAL | 0 refills | Status: AC
Start: 2024-05-11 — End: ?

## 2024-05-11 MED ORDER — IOHEXOL 300 MG/ML  SOLN
100.0000 mL | Freq: Once | INTRAMUSCULAR | Status: AC | PRN
  Administered 2024-05-11: 100 mL via INTRAVENOUS

## 2024-05-11 NOTE — Assessment & Plan Note (Signed)
Blood pressure at goal today on amlodipine 10 mg daily. 

## 2024-05-11 NOTE — Assessment & Plan Note (Signed)
 Continue management per vascular surgery.  On statin, Plavix, and Pletal .

## 2024-05-11 NOTE — Patient Instructions (Signed)
 It was very nice to see you today!  I think you may have diverticulitis. Please start the Augmentin .   We will check blood work today.  Return if symptoms worsen or fail to improve.   Take care, Dr Daneil Dunker  PLEASE NOTE:  If you had any lab tests, please let us  know if you have not heard back within a few days. You may see your results on mychart before we have a chance to review them but we will give you a call once they are reviewed by us .   If we ordered any referrals today, please let us  know if you have not heard from their office within the next week.   If you had any urgent prescriptions sent in today, please check with the pharmacy within an hour of our visit to make sure the prescription was transmitted appropriately.   Please try these tips to maintain a healthy lifestyle:  Eat at least 3 REAL meals and 1-2 snacks per day.  Aim for no more than 5 hours between eating.  If you eat breakfast, please do so within one hour of getting up.   Each meal should contain half fruits/vegetables, one quarter protein, and one quarter carbs (no bigger than a computer mouse)  Cut down on sweet beverages. This includes juice, soda, and sweet tea.   Drink at least 1 glass of water with each meal and aim for at least 8 glasses per day  Exercise at least 150 minutes every week.

## 2024-05-11 NOTE — Progress Notes (Signed)
   Jim Mathis. is a 73 y.o. male who presents today for an office visit.  Assessment/Plan:  New/Acute Problems: Abdominal Pain /diarrhea No red flag signs or symptoms however concern for potential diverticulitis flare based on location of abdominal pain and constellation of symptoms.  We will check CT scan to confirm this as he has never had history of diverticulitis.  Additionally would be prudent for us  to check imaging especially in light of his complex history including history of SBO.  Will also check labs today.  Will send prescription in for Augmentin  to empirically treat for diverticulitis while we await above workup.  Mesenteric ischemia is also a consideration though less likely given his lack of bloody diarrhea.  If above workup is negative and symptoms persist would consider CTA abdomen to further evaluate for this.  Encouraged hydration.  We discussed reasons to return to care.  Chronic Problems Addressed Today: PAD (peripheral artery disease) (HCC) Continue management per vascular surgery.  On statin, Plavix, and Pletal .  Essential hypertension Blood pressure at goal today on amlodipine  10 mg daily.     Subjective:  HPI:  See Assessment / plan for status of chronic conditions. Patient is here with abdominal pain and diarrhea.  This has been going on for the last 5 days or so. He did have a lower extremity angiography 2 days ago for peripheral artery disease however his symptoms started prior to this.  Pain predominantly located in the left lower quadrant though sometimes feels generalized abdominal pain.  Does not have any symptoms currently though he has noticed over the last few days within a couple of hours after eating he will have crampy abdominal pain and diarrhea.  He has not noticed any fevers or chills.  He has had some nausea without any vomiting.  No hematemesis.  No melena or hematochezia.  He has tried switching to a more bland diet however still having persistent  symptoms.         Objective:  Physical Exam: BP 130/74   Pulse 91   Temp (!) 97.2 F (36.2 C) (Temporal)   Ht 5\' 10"  (1.778 m)   Wt 184 lb 6.4 oz (83.6 kg)   SpO2 99%   BMI 26.46 kg/m   Wt Readings from Last 3 Encounters:  05/11/24 184 lb 6.4 oz (83.6 kg)  05/09/24 185 lb (83.9 kg)  03/22/24 189 lb (85.7 kg)    Gen: No acute distress, resting comfortably CV: Regular rate and rhythm with systolic murmur appreciated Pulm: Normal work of breathing, clear to auscultation bilaterally with no crackles, wheezes, or rhonchi Abdomen: Surgical scars present however no other deformities.  Bowel sounds present.  Tender to palpation in the left lower quadrant with mild rebound tenderness.  No rigidity. Neuro: Grossly normal, moves all extremities Psych: Normal affect and thought content  Time Spent: 45 minutes of total time was spent on the date of the encounter performing the following actions: chart review prior to seeing the patient, obtaining history, performing a medically necessary exam, counseling on the treatment plan, coordinating care, placing orders, and documenting in our EHR.        Jinny Mounts. Daneil Dunker, MD 05/11/2024 9:02 AM

## 2024-05-14 ENCOUNTER — Ambulatory Visit: Payer: PPO

## 2024-05-15 LAB — C. DIFFICILE GDH AND TOXIN A/B
GDH ANTIGEN: NOT DETECTED
MICRO NUMBER:: 16498365
SPECIMEN QUALITY:: ADEQUATE
TOXIN A AND B: NOT DETECTED

## 2024-05-15 LAB — HOUSE ACCOUNT TRACKING

## 2024-05-15 NOTE — Progress Notes (Signed)
 Blood work is all normal.  He should let us  know if symptoms are not improving with the antibiotic.

## 2024-05-15 NOTE — Telephone Encounter (Signed)
 See note

## 2024-05-15 NOTE — Telephone Encounter (Signed)
 That is correct.  According to the CT scan it appeared that his infection was more in the distal small bowel. I am glad his symptoms have improved. He should finish his course of antibiotics and let us  know if symptoms are not improving.

## 2024-05-16 ENCOUNTER — Ambulatory Visit (INDEPENDENT_AMBULATORY_CARE_PROVIDER_SITE_OTHER)

## 2024-05-16 VITALS — Ht 70.0 in | Wt 184.0 lb

## 2024-05-16 DIAGNOSIS — Z0000 Encounter for general adult medical examination without abnormal findings: Secondary | ICD-10-CM

## 2024-05-16 NOTE — Progress Notes (Signed)
 Subjective:   Jim Godman. is a 72 y.o. who presents for a Medicare Wellness preventive visit.  As a reminder, Annual Wellness Visits don't include a physical exam, and some assessments may be limited, especially if this visit is performed virtually. We may recommend an in-person follow-up visit with your provider if needed.  Visit Complete: Virtual I connected with  Jim Mathis. on 05/16/24 by a audio enabled telemedicine application and verified that I am speaking with the correct person using two identifiers.  Patient Location: Home  Provider Location: Home Office  I discussed the limitations of evaluation and management by telemedicine. The patient expressed understanding and agreed to proceed.  Vital Signs: Because this visit was a virtual/telehealth visit, some criteria may be missing or patient reported. Any vitals not documented were not able to be obtained and vitals that have been documented are patient reported.  VideoDeclined- This patient declined Librarian, academic. Therefore the visit was completed with audio only.  Persons Participating in Visit: Patient.  AWV Questionnaire: Yes: Patient Medicare AWV questionnaire was completed by the patient on 05/12/24; I have confirmed that all information answered by patient is correct and no changes since this date.  Cardiac Risk Factors include: advanced age (>74men, >92 women);hypertension;dyslipidemia;male gender     Objective:     Today's Vitals   05/16/24 0830  Weight: 184 lb (83.5 kg)  Height: 5\' 10"  (1.778 m)   Body mass index is 26.4 kg/m.     05/16/2024    8:35 AM 05/09/2024    9:38 AM 05/09/2023    9:53 AM 08/06/2022   11:05 AM 04/26/2022    1:54 PM 05/08/2021    2:24 AM 05/07/2021    9:59 PM  Advanced Directives  Does Patient Have a Medical Advance Directive? Yes Yes Yes Yes Yes Yes No  Type of Estate agent of [place];Living will Living  will;Healthcare Power of [employer] Power of [place];Living will Living will Healthcare Power of [employer] Power of [place];Living will   Does patient want to make changes to medical advance directive? No - Patient declined No - Patient declined No - Patient declined No - Patient declined  Yes (Inpatient - patient defers changing a medical advance directive and declines information at this time)   Copy of Healthcare Power of Attorney in Chart? Yes - validated most recent copy scanned in chart (See row information)  Yes - validated most recent copy scanned in chart (See row information)  Yes - validated most recent copy scanned in chart (See row information) No - copy requested     Current Medications (verified) Outpatient Encounter Medications as of 05/16/2024  Medication Sig   amLODipine  (NORVASC ) 10 MG tablet Take 1 tablet (10 mg total) by mouth daily.   amoxicillin -clavulanate (AUGMENTIN ) 875-125 MG tablet Take 1 tablet by mouth 2 (two) times daily.   aspirin  EC 81 MG tablet Take 81 mg by mouth daily. Swallow whole.   atorvastatin  (LIPITOR) 40 MG tablet Take 1 tablet (40 mg total) by mouth daily.   Calcium  Carbonate-Vit D-Min (CALCIUM  1200 PO) Take 1,200 mg by mouth daily.   cilostazol  (PLETAL ) 100 MG tablet Take 1 tablet (100 mg total) by mouth 2 (two) times daily before a meal.   clopidogrel (PLAVIX) 75 MG tablet Take 1 tablet (75 mg total) by mouth daily.   COMIRNATY syringe    cyanocobalamin  (VITAMIN B12) 1000 MCG tablet Take 1,000 mcg by mouth daily.   doxepin  (  SINEQUAN ) 25 MG capsule Take 1 capsule (25 mg total) by mouth daily.   finasteride  (PROSCAR ) 5 MG tablet Take 1 tablet (5 mg total) by mouth at bedtime.   fluticasone (FLONASE) 50 MCG/ACT nasal spray Place 1 spray into both nostrils daily as needed for allergies or rhinitis.   folic acid (FOLVITE) 1 MG tablet Take 1 mg by mouth daily.   loratadine (CLARITIN) 10 MG tablet Take 10 mg by mouth daily.    methotrexate (RHEUMATREX) 2.5 MG tablet Take 15 mg by mouth once a week. Tuesday   naphazoline-pheniramine (ALLERGY EYE) 0.025-0.3 % ophthalmic solution Place 1 drop into both eyes daily as needed for allergies.   predniSONE  (DELTASONE ) 1 MG tablet Take 2 mg by mouth daily.   PREVNAR 20 0.5 ML injection    tadalafil  (CIALIS ) 20 MG tablet Take 1 tablet (20 mg total) by mouth daily.   triamcinolone  ointment (KENALOG ) 0.5 % APPLY TO AFFECTED AREA(S) THREE TIMES A DAY (Patient taking differently: Apply 1 Application topically daily as needed (Rash).)   vitamin E 180 MG (400 UNITS) capsule Take 400 Units by mouth daily.   No facility-administered encounter medications on file as of 05/16/2024.    Allergies (verified) Pollen extract   History: Past Medical History:  Diagnosis Date   Allergy 2022   getting more severe each year   Anxiety 30 years   anxiety while sleeping   Arthritis approx. 2016   lower back and neck, leads to headaches.   BPH with obstruction/lower urinary tract symptoms    Carotid stenosis, right    per pt was told he has some stenosis but not enough for surgery;   in epic under media ,  carotid ultrasound result right ICA 50-69% and incidental finding thyroid  nodule   Cervicalgia    ED (erectile dysfunction)    History of colon resection    per pt 12 hours after partial knee arthroplasty 2017,  s/p partial colectomy was possible from divertiulitis   Hyperlipidemia    Hypertension    Migraines    PAD (peripheral artery disease) (HCC) 08-14-2019  per pt when he walks (walks 2 miles a day) right foot gets numb but recovers quickly and when he rides his bike left hand gets numb by recovers quickly ;   denies claudication or swelling   per pt s/p  stenting to bilateral lower legs in [place]  one in 2003 and the other 2007,  no follow up since moved to [place]   Wears glasses    Past Surgical History:  Procedure Laterality Date   ABDOMINAL AORTOGRAM N/A 05/09/2024   Procedure:  ABDOMINAL AORTOGRAM;  Surgeon: Kayla Part, MD;  Location: [facility] INVASIVE CV LAB;  Service: Cardiovascular;  Laterality: N/A;   COLON SURGERY  2017   sigmoid colon perforation   COLOSTOMY REVERSAL  02-21-2017     [place], [place] (OR record scanned in epic)   w/ sigmoid and descending colectomy with appendectomy   CYSTOSCOPY WITH INJECTION N/A 12/19/2019   Procedure: CYSTOSCOPY WITH INJECTION;  Surgeon: Osborn Blaze, MD;  Location: [facility];  Service: Urology;  Laterality: N/A;   CYSTOSCOPY WITH URETHRAL DILATATION N/A 12/19/2019   Procedure: CYSTOSCOPY WITH URETHRAL DILATATION;  Surgeon: Osborn Blaze, MD;  Location: [facility];  Service: Urology;  Laterality: N/A;  45 MINS   ENDOVASCULAR STENT INSERTION  2003 and 2007  --- both done in [place]    bilateral legs   LOWER EXTREMITY ANGIOGRAPHY Right 05/09/2024  Procedure: Lower Extremity Angiography;  Surgeon: Kayla Part, MD;  Location: [facility] INVASIVE CV LAB;  Service: Cardiovascular;  Laterality: Right;   LOWER EXTREMITY INTERVENTION Right 05/09/2024   Procedure: LOWER EXTREMITY INTERVENTION;  Surgeon: Kayla Part, MD;  Location: [facility] INVASIVE CV LAB;  Service: Cardiovascular;  Laterality: Right;   PARTIAL COLECTOMY  09-04-2016  in [place], [place]   w/  colostomy for perferated divierticulitis   PARTIAL KNEE ARTHROPLASTY Left 2017   PERIPHERAL INTRAVASCULAR LITHOTRIPSY Right 05/09/2024   Procedure: PERIPHERAL INTRAVASCULAR LITHOTRIPSY;  Surgeon: Kayla Part, MD;  Location: [facility] INVASIVE CV LAB;  Service: Cardiovascular;  Laterality: Right;   PILONIDAL CYST EXCISION     TOTAL KNEE ARTHROPLASTY Right 2012   TRANSURETHRAL RESECTION OF PROSTATE N/A 08/15/2019   Procedure: TRANSURETHRAL RESECTION OF THE PROSTATE (TURP);  Surgeon: Osborn Blaze, MD;  Location: [facility];  Service: Urology;  Laterality: N/A;   Family History  Problem Relation Age of Onset   Alzheimer's disease Mother     Arthritis Mother    Hyperlipidemia Mother    Hypertension Mother    Lung cancer Father        non smoker   Cancer Father    COPD Sister    Heart failure Sister    ALS Brother    Depression Brother    Early death Brother    Heart disease Sister    Colon cancer Neg Hx    Esophageal cancer Neg Hx    Pancreatic cancer Neg Hx    Stomach cancer Neg Hx    Liver disease Neg Hx    Rectal cancer Neg Hx    Social History   Socioeconomic History   Marital status: Married    Spouse name: Not on file   Number of children: Not on file   Years of education: Not on file   Highest education level: Master's degree (e.g., MA, MS, MEng, MEd, MSW, MBA)  Occupational History   Not on file  Tobacco Use   Smoking status: Former    Current packs/day: 0.00    Average packs/day: 1 pack/day for 20.0 years (20.0 ttl pk-yrs)    Types: Cigarettes, Cigars    Start date: 05/23/1974    Quit date: 05/23/1994    Years since quitting: 30.0   Smokeless tobacco: Never   Tobacco comments:    tobacco free 29 years  Vaping Use   Vaping status: Never Used  Substance and Sexual Activity   Alcohol use: Yes    Alcohol/week: 4.0 standard drinks of alcohol    Types: 2 Glasses of wine, 2 Cans of beer per week    Comment: occasionally   Drug use: Never   Sexual activity: Yes    Birth control/protection: None  Other Topics Concern   Not on file  Social History Narrative   Right handed   Lives in two story home with wife   Social Drivers of Health   Financial Resource Strain: Low Risk  (05/10/2024)   Overall Financial Resource Strain (CARDIA)    Difficulty of Paying Living Expenses: Not very hard  Food Insecurity: No Food Insecurity (05/10/2024)   Hunger Vital Sign    Worried About Running Out of Food in the Last Year: Never true    Ran Out of Food in the Last Year: Never true  Transportation Needs: No Transportation Needs (05/10/2024)   PRAPARE - Administrator, Civil Service (Medical): No     Lack of Transportation (Non-Medical):  No  Physical Activity: Sufficiently Active (05/10/2024)   Exercise Vital Sign    Days of Exercise per Week: 3 days    Minutes of Exercise per Session: 90 min  Stress: No Stress Concern Present (05/10/2024)   [employer] of Occupational Health - Occupational Stress Questionnaire    Feeling of Stress : Only a little  Social Connections: Socially Integrated (05/10/2024)   Social Connection and Isolation Panel [NHANES]    Frequency of Communication with Friends and Family: More than three times a week    Frequency of Social Gatherings with Friends and Family: Once a week    Attends Religious Services: More than 4 times per year    Active Member of [employer] or Organizations: Yes    Attends Banker Meetings: 1 to 4 times per year    Marital Status: Married    Tobacco Counseling Counseling given: Not Answered Tobacco comments: tobacco free 29 years    Clinical Intake:  Pre-visit preparation completed: Yes  Pain : No/denies pain     BMI - recorded: 26.4 Diabetes: No  Lab Results  Component Value Date   HGBA1C 5.9 12/05/2023   HGBA1C 5.5 09/27/2023   HGBA1C 6.1 08/31/2022     How often do you need to have someone help you when you read instructions, pamphlets, or other written materials from your doctor or pharmacy?: 1 - Never  Interpreter Needed?: No  Information entered by :: Lamont Pilsner, LPN   Activities of Daily Living     05/12/2024    8:45 AM  In your present state of health, do you have any difficulty performing the following activities:  Hearing? 1  Comment hearing aids  Vision? 1  Comment blurry eyed  Difficulty concentrating or making decisions? 0  Walking or climbing stairs? 0  Dressing or bathing? 0  Doing errands, shopping? 0  Preparing Food and eating ? N  Using the Toilet? N  In the past six months, have you accidently leaked urine? N  Do you have problems with loss of bowel control? N   Managing your Medications? N  Managing your Finances? N  Housekeeping or managing your Housekeeping? N    Patient Care Team: Rodney Clamp, MD as PCP - General (Family Medicine) Merriam Abbey, DO as Consulting Physician (Neurology) Secundino Dach Harvey Linen., MD as Consulting Physician (Urology) Rolando Cliche, [facility] as Pharmacist (Pharmacist)  Indicate any recent Medical Services you may have received from other than [facility] providers in the past year (date may be approximate).     Assessment:    This is a routine wellness examination for Jim Mathis.  Hearing/Vision screen Hearing Screening - Comments:: Pt wears hearing aids  Vision Screening - Comments:: Wears rx glasses - up to date with routine eye exams with [facility] Dr Chip Coulter     Goals Addressed             This Visit's Progress    Patient Stated       Maintain health and activity        Depression Screen     05/16/2024    8:35 AM 05/11/2024    7:50 AM 12/05/2023   10:28 AM 09/27/2023   10:12 AM 05/09/2023    9:53 AM 03/23/2023    9:16 AM 08/31/2022    8:54 AM  PHQ 2/9 Scores  PHQ - 2 Score 0 0 0 0 0 0 0  PHQ- 9 Score 0 0 0  Fall Risk     05/12/2024    8:45 AM 05/11/2024    7:50 AM 12/05/2023   10:28 AM 09/27/2023   10:13 AM 05/05/2023    9:17 AM  Fall Risk   Falls in the past year? 0 0 0 0 0  Number falls in past yr:  0 0 0 0  Injury with Fall? 0 0 0 0 0  Risk for fall due to : No Fall Risks No Fall Risks No Fall Risks No Fall Risks Impaired vision  Follow up Falls prevention discussed  Falls evaluation completed  Falls prevention discussed    MEDICARE RISK AT HOME:  Medicare Risk at Home Any stairs in or around the home?: (Patient-Rptd) No If so, are there any without handrails?: (Patient-Rptd) No Home free of loose throw rugs in walkways, pet beds, electrical cords, etc?: (Patient-Rptd) No Adequate lighting in your home to reduce risk of falls?: (Patient-Rptd) Yes Life alert?: (Patient-Rptd) No Use  of a cane, walker or w/c?: (Patient-Rptd) No Grab bars in the bathroom?: (Patient-Rptd) Yes Shower chair or bench in shower?: (Patient-Rptd) Yes Elevated toilet seat or a handicapped toilet?: (Patient-Rptd) Yes  TIMED UP AND GO:  Was the test performed?  No  Cognitive Function: 6CIT completed        05/16/2024    8:38 AM 05/09/2023    9:55 AM 04/26/2022    1:58 PM 04/20/2021    2:10 PM 03/11/2020    3:17 PM  6CIT Screen  What Year? 0 points 0 points 0 points 0 points 0 points  What month? 0 points 0 points 0 points 0 points 0 points  What time? 0 points 0 points 0 points  0 points  Count back from 20 0 points 0 points 0 points 0 points 0 points  Months in reverse 0 points 0 points 0 points 0 points 0 points  Repeat phrase 0 points 0 points 0 points 0 points 0 points  Total Score 0 points 0 points 0 points  0 points    Immunizations Immunization History  Administered Date(s) Administered   Fluad Quad(high Dose 65+) 08/31/2022   Influenza, High Dose Seasonal PF 08/03/2019   Influenza-Unspecified 09/23/2020, 08/20/2021, 08/02/2023   PFIZER(Purple Top)SARS-COV-2 Vaccination 02/02/2020, 02/27/2020, 09/23/2020, 04/24/2021   Pfizer Covid-19 Vaccine Bivalent Booster 67yrs & up 10/24/2021, 11/04/2021   Pfizer(Comirnaty)Fall Seasonal Vaccine 12 years and older 10/20/2022, 08/18/2023   Pneumococcal Conjugate-13 01/25/2016   Pneumococcal Polysaccharide-23 01/24/2017   Respiratory Syncytial Virus Vaccine,Recomb Aduvanted(Arexvy) 09/21/2023   Tdap 02/08/2018   Zoster Recombinant(Shingrix) 07/15/2019, 04/09/2020   Zoster, Live 04/09/2020    Screening Tests Health Maintenance  Topic Date Due   Zoster Vaccines- Shingrix (2 of 2) 06/04/2020   COVID-19 Vaccine (8 - Pfizer risk 2024-25 season) 05/27/2024 (Originally 02/17/2024)   INFLUENZA VACCINE  07/20/2024   Medicare Annual Wellness (AWV)  05/16/2025   DTaP/Tdap/Td (2 - Td or Tdap) 02/09/2028   Colonoscopy  07/10/2033   Pneumonia  Vaccine 34+ Years old  Completed   Hepatitis C Screening  Completed   HPV VACCINES  Aged Out   Meningococcal B Vaccine  Aged Out    Health Maintenance  Health Maintenance Due  Topic Date Due   Zoster Vaccines- Shingrix (2 of 2) 06/04/2020   Health Maintenance Items Addressed: See Nurse Notes  Additional Screening:  Vision Screening: Recommended annual ophthalmology exams for early detection of glaucoma and other disorders of the eye.  Dental Screening: Recommended annual dental exams for proper oral hygiene  Community Resource Referral / Chronic Care Management: CRR required this visit?  No   CCM required this visit?  No   Plan:    I have personally reviewed and noted the following in the patient's chart:   Medical and social history Use of alcohol, tobacco or illicit drugs  Current medications and supplements including opioid prescriptions. Patient is not currently taking opioid prescriptions. Functional ability and status Nutritional status Physical activity Advanced directives List of other physicians Hospitalizations, surgeries, and ER visits in previous 12 months Vitals Screenings to include cognitive, depression, and falls Referrals and appointments  In addition, I have reviewed and discussed with patient certain preventive protocols, quality metrics, and best practice recommendations. A written personalized care plan for preventive services as well as general preventive health recommendations were provided to patient.   Bruno Capri, LPN   03/12/4009   After Visit Summary: (MyChart) Due to this being a telephonic visit, the after visit summary with patients personalized plan was offered to patient via MyChart   Notes: Nothing significant to report at this time.

## 2024-05-16 NOTE — Patient Instructions (Signed)
 Mr. Krantz , Thank you for taking time out of your busy schedule to complete your Annual Wellness Visit with me. I enjoyed our conversation and look forward to speaking with you again next year. I, as well as your care team,  appreciate your ongoing commitment to your health goals. Please review the following plan we discussed and let me know if I can assist you in the future. Your Game plan/ To Do List    Referrals: If you haven't heard from the office you've been referred to, please reach out to them at the phone provided.   Follow up Visits: Next Medicare AWV with our clinical staff: 05/23/24   Have you seen your provider in the last 6 months (3 months if uncontrolled diabetes)? Yes Next Office Visit with your provider: 12/06/24  Clinician Recommendations:  Aim for 30 minutes of exercise or brisk walking, 6-8 glasses of water, and 5 servings of fruits and vegetables each day.       This is a list of the screening recommended for you and due dates:  Health Maintenance  Topic Date Due   Zoster (Shingles) Vaccine (2 of 2) 06/04/2020   Medicare Annual Wellness Visit  05/08/2024   COVID-19 Vaccine (8 - Pfizer risk 2024-25 season) 05/27/2024*   Flu Shot  07/20/2024   DTaP/Tdap/Td vaccine (2 - Td or Tdap) 02/09/2028   Colon Cancer Screening  07/10/2033   Pneumonia Vaccine  Completed   Hepatitis C Screening  Completed   HPV Vaccine  Aged Out   Meningitis B Vaccine  Aged Out  *Topic was postponed. The date shown is not the original due date.    Advanced directives: (In Chart) A copy of your advanced directives are scanned into your chart should your provider ever need it. Advance Care Planning is important because it:  [x]  Makes sure you receive the medical care that is consistent with your values, goals, and preferences  [x]  It provides guidance to your family and loved ones and reduces their decisional burden about whether or not they are making the right decisions based on your  wishes.  Follow the link provided in your after visit summary or read over the paperwork we have mailed to you to help you started getting your Advance Directives in place. If you need assistance in completing these, please reach out to us  so that we can help you!  See attachments for Preventive Care and Fall Prevention Tips.

## 2024-05-17 ENCOUNTER — Ambulatory Visit: Payer: Self-pay | Admitting: Family Medicine

## 2024-05-17 NOTE — Progress Notes (Signed)
 C. difficile test is negative.

## 2024-05-21 ENCOUNTER — Other Ambulatory Visit: Payer: Self-pay

## 2024-05-21 DIAGNOSIS — I739 Peripheral vascular disease, unspecified: Secondary | ICD-10-CM

## 2024-05-22 NOTE — Telephone Encounter (Signed)
 See note

## 2024-06-05 ENCOUNTER — Other Ambulatory Visit: Payer: Self-pay | Admitting: *Deleted

## 2024-06-05 ENCOUNTER — Encounter: Payer: Self-pay | Admitting: Family Medicine

## 2024-06-05 DIAGNOSIS — R109 Unspecified abdominal pain: Secondary | ICD-10-CM

## 2024-06-05 DIAGNOSIS — R110 Nausea: Secondary | ICD-10-CM

## 2024-06-05 MED ORDER — AMOXICILLIN-POT CLAVULANATE 875-125 MG PO TABS: 1.0000 | ORAL_TABLET | Freq: Two times a day (BID) | ORAL | 0 refills | Status: DC

## 2024-06-05 NOTE — Telephone Encounter (Signed)
 We can send in one more round of Augmentin  but if its not better after that then we may need to refer him to gastroenterology.  Jinny Mounts. Daneil Dunker, MD 06/05/2024 11:55 AM

## 2024-06-05 NOTE — Telephone Encounter (Signed)
**Note De-identified  [place] Obfuscation** Please advise 

## 2024-06-07 ENCOUNTER — Ambulatory Visit: Attending: Vascular Surgery | Admitting: Vascular Surgery

## 2024-06-07 ENCOUNTER — Ambulatory Visit (HOSPITAL_COMMUNITY): Admit: 2024-06-07 | Discharge: 2024-06-07 | Discharge status: Home / Self Care | Attending: Vascular Surgery

## 2024-06-07 ENCOUNTER — Ambulatory Visit (HOSPITAL_COMMUNITY)
Admission: RE | Admit: 2024-06-07 | Discharge: 2024-06-07 | Disposition: A | Discharge status: Home / Self Care | Source: Ambulatory Visit | Attending: Vascular Surgery | Admitting: Vascular Surgery

## 2024-06-07 ENCOUNTER — Encounter: Payer: Self-pay | Admitting: Vascular Surgery

## 2024-06-07 VITALS — BP 130/75 | HR 72 | Temp 98.3°F | Resp 16 | Ht 70.0 in | Wt 187.4 lb

## 2024-06-07 DIAGNOSIS — Z95828 Presence of other vascular implants and grafts: Secondary | ICD-10-CM | POA: Diagnosis not present

## 2024-06-07 DIAGNOSIS — I70213 Atherosclerosis of native arteries of extremities with intermittent claudication, bilateral legs: Secondary | ICD-10-CM | POA: Diagnosis not present

## 2024-06-07 DIAGNOSIS — I739 Peripheral vascular disease, unspecified: Secondary | ICD-10-CM

## 2024-06-07 LAB — VAS US ABI WITH/WO TBI
Left ABI: 1.1
Right ABI: 0.96

## 2024-06-07 NOTE — Progress Notes (Signed)
 Office Note    HPI: Jim Mathis. is a 73 y.o. (12/19/1951) male presenting in follow up status post 05/09/2024 superficial femoral artery and popliteal artery balloon lithotripsy, superficial femoral artery drug-coated balloon angioplasty,   A native of [place] , he lived there for years prior to moving to [place]  for 20 years.  He moved to [place]  to escape the cold after retirement.  He has 2 children, and continues to be happily married.  Ed has a history of bilateral iliac stents, right sided superficial femoral artery stent placed in [place]  over a decade ago.  Since his most recent invention, it has been doing well.  He is back to walking 5 miles a day.  He notes some mild claudication in the left calf at the 5 mile mark, but states that the right leg has improved dramatically since lithotripsy/DCB.  Denies rest pain, denies tissue loss. He is a former smoker.  Medications include high intensity statin Pletal    Past Medical History:  Diagnosis Date   Allergy 2022   getting more severe each year   Anxiety 30 years   anxiety while sleeping   Arthritis approx. 2016   lower back and neck, leads to headaches.   BPH with obstruction/lower urinary tract symptoms    Carotid stenosis, right    per pt was told he has some stenosis but not enough for surgery;   in epic under media ,  carotid ultrasound result right ICA 50-69% and incidental finding thyroid  nodule   Cervicalgia    ED (erectile dysfunction)    History of colon resection    per pt 12 hours after partial knee arthroplasty 2017,  s/p partial colectomy was possible from divertiulitis   Hyperlipidemia    Hypertension    Migraines    PAD (peripheral artery disease) (HCC) 08-14-2019  per pt when he walks (walks 2 miles a day) right foot gets numb but recovers quickly and when he rides his bike left hand gets numb by recovers quickly ;   denies claudication or swelling   per pt s/p  stenting to bilateral lower legs  in [place]  one in 2003 and the other 2007,  no follow up since moved to [place]   Wears glasses     Past Surgical History:  Procedure Laterality Date   ABDOMINAL AORTOGRAM N/A 05/09/2024   Procedure: ABDOMINAL AORTOGRAM;  Surgeon: Kayla Part, MD;  Location: [facility] INVASIVE CV LAB;  Service: Cardiovascular;  Laterality: N/A;   COLON SURGERY  2017   sigmoid colon perforation   COLOSTOMY REVERSAL  02-21-2017     [place], [place] (OR record scanned in epic)   w/ sigmoid and descending colectomy with appendectomy   CYSTOSCOPY WITH INJECTION N/A 12/19/2019   Procedure: CYSTOSCOPY WITH INJECTION;  Surgeon: Osborn Blaze, MD;  Location: [facility];  Service: Urology;  Laterality: N/A;   CYSTOSCOPY WITH URETHRAL DILATATION N/A 12/19/2019   Procedure: CYSTOSCOPY WITH URETHRAL DILATATION;  Surgeon: Osborn Blaze, MD;  Location: [facility];  Service: Urology;  Laterality: N/A;  45 MINS   ENDOVASCULAR STENT INSERTION  2003 and 2007  --- both done in [place]    bilateral legs   LOWER EXTREMITY ANGIOGRAPHY Right 05/09/2024   Procedure: Lower Extremity Angiography;  Surgeon: Kayla Part, MD;  Location: [facility] INVASIVE CV LAB;  Service: Cardiovascular;  Laterality: Right;   LOWER EXTREMITY INTERVENTION Right 05/09/2024   Procedure: LOWER EXTREMITY INTERVENTION;  Surgeon: Kayla Part, MD;  Location: [facility] INVASIVE  CV LAB;  Service: Cardiovascular;  Laterality: Right;   PARTIAL COLECTOMY  09-04-2016  in [place], [place]   w/  colostomy for perferated divierticulitis   PARTIAL KNEE ARTHROPLASTY Left 2017   PERIPHERAL INTRAVASCULAR LITHOTRIPSY Right 05/09/2024   Procedure: PERIPHERAL INTRAVASCULAR LITHOTRIPSY;  Surgeon: Kayla Part, MD;  Location: [facility] INVASIVE CV LAB;  Service: Cardiovascular;  Laterality: Right;   PILONIDAL CYST EXCISION     TOTAL KNEE ARTHROPLASTY Right 2012   TRANSURETHRAL RESECTION OF PROSTATE N/A 08/15/2019   Procedure: TRANSURETHRAL RESECTION OF THE PROSTATE  (TURP);  Surgeon: Osborn Blaze, MD;  Location: [facility];  Service: Urology;  Laterality: N/A;    Social History   Socioeconomic History   Marital status: Married    Spouse name: Not on file   Number of children: Not on file   Years of education: Not on file   Highest education level: Master's degree (e.g., MA, MS, MEng, MEd, MSW, MBA)  Occupational History   Not on file  Tobacco Use   Smoking status: Former    Current packs/day: 0.00    Average packs/day: 1 pack/day for 20.0 years (20.0 ttl pk-yrs)    Types: Cigarettes, Cigars    Start date: 05/23/1974    Quit date: 05/23/1994    Years since quitting: 30.0   Smokeless tobacco: Never   Tobacco comments:    tobacco free 29 years  Vaping Use   Vaping status: Never Used  Substance and Sexual Activity   Alcohol use: Yes    Alcohol/week: 4.0 standard drinks of alcohol    Types: 2 Glasses of wine, 2 Cans of beer per week    Comment: occasionally   Drug use: Never   Sexual activity: Yes    Birth control/protection: None  Other Topics Concern   Not on file  Social History Narrative   Right handed   Lives in two story home with wife   Social Drivers of Health   Financial Resource Strain: Low Risk  (05/10/2024)   Overall Financial Resource Strain (CARDIA)    Difficulty of Paying Living Expenses: Not very hard  Food Insecurity: No Food Insecurity (05/10/2024)   Hunger Vital Sign    Worried About Running Out of Food in the Last Year: Never true    Ran Out of Food in the Last Year: Never true  Transportation Needs: No Transportation Needs (05/10/2024)   PRAPARE - Administrator, Civil Service (Medical): No    Lack of Transportation (Non-Medical): No  Physical Activity: Sufficiently Active (05/10/2024)   Exercise Vital Sign    Days of Exercise per Week: 3 days    Minutes of Exercise per Session: 90 min  Stress: No Stress Concern Present (05/10/2024)   [employer] of Occupational Health -  Occupational Stress Questionnaire    Feeling of Stress : Only a little  Social Connections: Socially Integrated (05/10/2024)   Social Connection and Isolation Panel    Frequency of Communication with Friends and Family: More than three times a week    Frequency of Social Gatherings with Friends and Family: Once a week    Attends Religious Services: More than 4 times per year    Active Member of [employer] or Organizations: Yes    Attends Banker Meetings: 1 to 4 times per year    Marital Status: Married  Catering manager Violence: Not At Risk (05/16/2024)   Humiliation, Afraid, Rape, and Kick questionnaire    Fear of Current or Ex-Partner:  No    Emotionally Abused: No    Physically Abused: No    Sexually Abused: No   Family History  Problem Relation Age of Onset   Alzheimer's disease Mother    Arthritis Mother    Hyperlipidemia Mother    Hypertension Mother    Lung cancer Father        non smoker   Cancer Father    COPD Sister    Heart failure Sister    ALS Brother    Depression Brother    Early death Brother    Heart disease Sister    Colon cancer Neg Hx    Esophageal cancer Neg Hx    Pancreatic cancer Neg Hx    Stomach cancer Neg Hx    Liver disease Neg Hx    Rectal cancer Neg Hx     Current Outpatient Medications  Medication Sig Dispense Refill   amLODipine  (NORVASC ) 10 MG tablet Take 1 tablet (10 mg total) by mouth daily. 90 tablet 1   amoxicillin -clavulanate (AUGMENTIN ) 875-125 MG tablet Take 1 tablet by mouth 2 (two) times daily. 20 tablet 0   aspirin  EC 81 MG tablet Take 81 mg by mouth daily. Swallow whole.     atorvastatin  (LIPITOR) 40 MG tablet Take 1 tablet (40 mg total) by mouth daily. 90 tablet 3   Calcium  Carbonate-Vit D-Min (CALCIUM  1200 PO) Take 1,200 mg by mouth daily.     cilostazol  (PLETAL ) 100 MG tablet Take 1 tablet (100 mg total) by mouth 2 (two) times daily before a meal. 60 tablet 11   clopidogrel  (PLAVIX ) 75 MG tablet Take 1 tablet (75 mg  total) by mouth daily. 30 tablet 11   COMIRNATY syringe      cyanocobalamin  (VITAMIN B12) 1000 MCG tablet Take 1,000 mcg by mouth daily.     doxepin  (SINEQUAN ) 25 MG capsule Take 1 capsule (25 mg total) by mouth daily. 90 capsule 1   finasteride  (PROSCAR ) 5 MG tablet Take 1 tablet (5 mg total) by mouth at bedtime. 90 tablet 3   fluticasone (FLONASE) 50 MCG/ACT nasal spray Place 1 spray into both nostrils daily as needed for allergies or rhinitis.     folic acid (FOLVITE) 1 MG tablet Take 1 mg by mouth daily.     loratadine (CLARITIN) 10 MG tablet Take 10 mg by mouth daily.     methotrexate (RHEUMATREX) 2.5 MG tablet Take 15 mg by mouth once a week. Tuesday     naphazoline-pheniramine (ALLERGY EYE) 0.025-0.3 % ophthalmic solution Place 1 drop into both eyes daily as needed for allergies.     predniSONE  (DELTASONE ) 1 MG tablet Take 2 mg by mouth daily.     PREVNAR 20 0.5 ML injection      tadalafil  (CIALIS ) 20 MG tablet Take 1 tablet (20 mg total) by mouth daily. 90 tablet 3   triamcinolone  ointment (KENALOG ) 0.5 % APPLY TO AFFECTED AREA(S) THREE TIMES A DAY (Patient taking differently: Apply 1 Application topically daily as needed (Rash).) 30 g 3   vitamin E 180 MG (400 UNITS) capsule Take 400 Units by mouth daily.     No current facility-administered medications for this visit.    Allergies  Allergen Reactions   Pollen Extract Cough, Itching and Other (See Comments)     REVIEW OF SYSTEMS:  [X]  denotes positive finding, [ ]  denotes negative finding Cardiac  Comments:  Chest pain or chest pressure:    Shortness of breath upon exertion:    Short  of breath when lying flat:    Irregular heart rhythm:        Vascular    Pain in calf, thigh, or hip brought on by ambulation:    Pain in feet at night that wakes you up from your sleep:     Blood clot in your veins:    Leg swelling:         Pulmonary    Oxygen at home:    Productive cough:     Wheezing:         Neurologic    Sudden  weakness in arms or legs:     Sudden numbness in arms or legs:     Sudden onset of difficulty speaking or slurred speech:    Temporary loss of vision in one eye:     Problems with dizziness:         Gastrointestinal    Blood in stool:     Vomited blood:         Genitourinary    Burning when urinating:     Blood in urine:        Psychiatric    Major depression:         Hematologic    Bleeding problems:    Problems with blood clotting too easily:        Skin    Rashes or ulcers:        Constitutional    Fever or chills:      PHYSICAL EXAMINATION:  Vitals:   06/07/24 1537  BP: 130/75  Pulse: 72  Resp: 16  Temp: 98.3 F (36.8 C)  TempSrc: Temporal  SpO2: 98%  Weight: 187 lb 6.4 oz (85 kg)  Height: 5' 10 (1.778 m)    General:  WDWN in NAD; vital signs documented above Gait: Not observed HENT: WNL, normocephalic Pulmonary: normal non-labored breathing , without wheezing Cardiac: regular HR Abdomen: soft, NT, no masses Skin: without rashes Vascular Exam/Pulses:  Right Left  Radial 2+ (normal) 2+ (normal)  Ulnar    Femoral 2+ (normal) 2+ (normal)  Popliteal    DP absent absent  PT 2+ 2+ (normal)   Extremities: without ischemic changes, without Gangrene , without cellulitis; without open wounds;  Musculoskeletal: no muscle wasting or atrophy  Neurologic: A&O X 3;  No focal weakness or paresthesias are detected Psychiatric:  The pt has Normal affect.   Non-Invasive Vascular Imaging:     ABI Findings:   +-------+-----------+-----------+------------+------------+  ABI/TBIToday's ABIToday's TBIPrevious ABIPrevious TBI  +-------+-----------+-----------+------------+------------+  Right 0.96       0.62       0.60        0.45          +-------+-----------+-----------+------------+------------+  Left  1.10       0.56       0.82        0.52          +-------+-----------+-----------+------------+------------+        +-----------+--------+-----+---------------+----------+--------+  RIGHT     PSV cm/sRatioStenosis       Waveform  Comments  +-----------+--------+-----+---------------+----------+--------+  CFA Prox   316          50-74% stenosismonophasicbrisk     +-----------+--------+-----+---------------+----------+--------+  DFA       283          50-74% stenosisbiphasic            +-----------+--------+-----+---------------+----------+--------+  SFA Prox   288          50-74%  stenosisbiphasic            +-----------+--------+-----+---------------+----------+--------+  SFA Mid    62                          biphasic            +-----------+--------+-----+---------------+----------+--------+  POP Prox   83                          biphasic            +-----------+--------+-----+---------------+----------+--------+  POP Mid    230          50-74% stenosisbiphasic            +-----------+--------+-----+---------------+----------+--------+  POP Distal 67                          biphasic            +-----------+--------+-----+---------------+----------+--------+  TP Trunk   77                          biphasic            +-----------+--------+-----+---------------+----------+--------+  ATA Distal                                       NV        +-----------+--------+-----+---------------+----------+--------+  PTA Distal 39                          biphasic            +-----------+--------+-----+---------------+----------+--------+  PERO Distal                                      NV        +-----------+--------+-----+---------------+----------+--------+       Right Stent(s):  +---------------+--------+--------+--------+--------+  SFA           PSV cm/sStenosisWaveformComments  +---------------+--------+--------+--------+--------+  Prox to Stent  85              biphasic           +---------------+--------+--------+--------+--------+  Proximal Stent 86              biphasic          +---------------+--------+--------+--------+--------+  Mid Stent      100             biphasic          +---------------+--------+--------+--------+--------+  Distal Stent   95              biphasic          +---------------+--------+--------+--------+--------+  Distal to Stent85              biphasic          +---------------+--------+--------+--------+--------+      ASSESSMENT/PLAN: Apolinar Klippel. is a 73 y.o. male presenting in follow up status post 05/09/2024 superficial femoral artery and popliteal artery balloon lithotripsy, superficial femoral artery drug-coated balloon angioplasty for progressive claudication and threatened right sided superficial femoral artery stent.    Overall he is doing well.  Back to walking 5 miles a day. Imaging was reviewed.  ABI improved dramatically. CTA was also reviewed demonstrating significant calcific disease involving the common femoral artery, proximal SFA.  This is something that I will have to watch closely as velocities on arterial duplex were elevated at this level.  At this time, with a palpable pulse in the foot, my plan is to follow him up in 3 months with repeat study. Should he have restenosis, would discuss common femoral endarterectomy.  I asked him to continue walking is much as possible and call my office should claudication symptoms return. Asked him to continue his current medication regimen.  He can hold cilostazol  moving forward.  [facility] [phone] Total time of patient care including pre-visit research, consultation, and documentation greater than 30 minutes

## 2024-06-08 ENCOUNTER — Other Ambulatory Visit: Payer: Self-pay | Admitting: *Deleted

## 2024-06-08 DIAGNOSIS — I70213 Atherosclerosis of native arteries of extremities with intermittent claudication, bilateral legs: Secondary | ICD-10-CM

## 2024-06-08 DIAGNOSIS — Z95828 Presence of other vascular implants and grafts: Secondary | ICD-10-CM

## 2024-06-08 DIAGNOSIS — I739 Peripheral vascular disease, unspecified: Secondary | ICD-10-CM

## 2024-06-13 DIAGNOSIS — H0288B Meibomian gland dysfunction left eye, upper and lower eyelids: Secondary | ICD-10-CM | POA: Diagnosis not present

## 2024-06-13 DIAGNOSIS — H0288A Meibomian gland dysfunction right eye, upper and lower eyelids: Secondary | ICD-10-CM | POA: Diagnosis not present

## 2024-06-13 DIAGNOSIS — H02845 Edema of left lower eyelid: Secondary | ICD-10-CM | POA: Diagnosis not present

## 2024-06-13 DIAGNOSIS — H16223 Keratoconjunctivitis sicca, not specified as Sjogren's, bilateral: Secondary | ICD-10-CM | POA: Diagnosis not present

## 2024-06-13 DIAGNOSIS — H02842 Edema of right lower eyelid: Secondary | ICD-10-CM | POA: Diagnosis not present

## 2024-06-13 DIAGNOSIS — L718 Other rosacea: Secondary | ICD-10-CM | POA: Diagnosis not present

## 2024-06-14 DIAGNOSIS — M542 Cervicalgia: Secondary | ICD-10-CM | POA: Diagnosis not present

## 2024-06-14 DIAGNOSIS — M5416 Radiculopathy, lumbar region: Secondary | ICD-10-CM | POA: Diagnosis not present

## 2024-06-14 DIAGNOSIS — M353 Polymyalgia rheumatica: Secondary | ICD-10-CM | POA: Diagnosis not present

## 2024-06-14 DIAGNOSIS — M25551 Pain in right hip: Secondary | ICD-10-CM | POA: Diagnosis not present

## 2024-06-14 DIAGNOSIS — Z79899 Other long term (current) drug therapy: Secondary | ICD-10-CM | POA: Diagnosis not present

## 2024-06-14 DIAGNOSIS — M0609 Rheumatoid arthritis without rheumatoid factor, multiple sites: Secondary | ICD-10-CM | POA: Diagnosis not present

## 2024-06-14 DIAGNOSIS — M25552 Pain in left hip: Secondary | ICD-10-CM | POA: Diagnosis not present

## 2024-06-14 DIAGNOSIS — G5600 Carpal tunnel syndrome, unspecified upper limb: Secondary | ICD-10-CM | POA: Diagnosis not present

## 2024-06-14 DIAGNOSIS — M419 Scoliosis, unspecified: Secondary | ICD-10-CM | POA: Diagnosis not present

## 2024-07-25 ENCOUNTER — Ambulatory Visit: Admitting: Gastroenterology

## 2024-07-25 DIAGNOSIS — L718 Other rosacea: Secondary | ICD-10-CM | POA: Diagnosis not present

## 2024-07-25 DIAGNOSIS — H16223 Keratoconjunctivitis sicca, not specified as Sjogren's, bilateral: Secondary | ICD-10-CM | POA: Diagnosis not present

## 2024-07-25 DIAGNOSIS — H0288B Meibomian gland dysfunction left eye, upper and lower eyelids: Secondary | ICD-10-CM | POA: Diagnosis not present

## 2024-07-25 DIAGNOSIS — H1045 Other chronic allergic conjunctivitis: Secondary | ICD-10-CM | POA: Diagnosis not present

## 2024-07-25 DIAGNOSIS — H0288A Meibomian gland dysfunction right eye, upper and lower eyelids: Secondary | ICD-10-CM | POA: Diagnosis not present

## 2024-07-28 ENCOUNTER — Encounter: Payer: Self-pay | Admitting: Family Medicine

## 2024-07-30 ENCOUNTER — Other Ambulatory Visit: Payer: Self-pay | Admitting: *Deleted

## 2024-07-30 MED ORDER — ATORVASTATIN CALCIUM 40 MG PO TABS: 40.0000 mg | ORAL_TABLET | Freq: Every day | ORAL | 3 refills | Status: DC

## 2024-08-07 NOTE — Progress Notes (Unsigned)
 Chief Complaint:Nausea and vomiting, abd pain Primary GI Doctor:Dr. San  HPI:  Patient is a  73  year old male patient with past medical history of hypertension, history of diverticulosis of colon without hemorrhage, s/p colectomy 2018, hyperlipidemia, PAD, and BPH, who was referred to me by Kennyth Worth HERO, MD on 06/05/24 for a evaluation of nausea, vomiting, and abd pain .   Patient was last seen in GI office on 05/13/2023 by Alan, [place] for bloating and frequent stools  05/11/24 pt presented to PCP with abd pain and diarrhea with concerns for diverticulitis. CTAP ordered and patient given Augmentin . Cdiff stool negative.  Interval History    Patient presents for evaluation of stool urgency after eating. He reports the stool is semi formed and small amount most of the time. He denies nocturnal symptoms. No pain. No blood. He tells me he has had issues intermittently since his bowel surgery in 2018.  He reports in [place] he thinks he got a stomach bug and went to see his PCP for diarrhea and nausea. He ordered CT scan to r/o diverticulitis and negative. He gave him 30 days of Augmentin  for suspected enteric infection which provided some improvement in his symptoms so he was given an additional 30 days of Augmentin  which he completed. He reports this resolved the diarrhea and the nausea. His main issue now is the urgency to go to restroom after eating. He denies dietary changes or food triggers. He cooks at home most of the time. They have not traveled recently. Only new medication was the Plavix  he was placed on for PVD.  Wt Readings from Last 3 Encounters:  08/08/24 187 lb (84.8 kg)  06/07/24 187 lb 6.4 oz (85 kg)  05/16/24 184 lb (83.5 kg)    Past Medical History:  Diagnosis Date   Allergy 2022   getting more severe each year   Anxiety 30 years   anxiety while sleeping   Arthritis approx. 2016   lower back and neck, leads to headaches.   BPH with obstruction/lower urinary tract  symptoms    Carotid stenosis, right    per pt was told he has some stenosis but not enough for surgery;   in epic under media ,  carotid ultrasound result right ICA 50-69% and incidental finding thyroid  nodule   Cervicalgia    ED (erectile dysfunction)    History of colon resection    per pt 12 hours after partial knee arthroplasty 2017,  s/p partial colectomy was possible from divertiulitis   Hyperlipidemia    Hypertension    Migraines    PAD (peripheral artery disease) (HCC) 08-14-2019  per pt when he walks (walks 2 miles a day) right foot gets numb but recovers quickly and when he rides his bike left hand gets numb by recovers quickly ;   denies claudication or swelling   per pt s/p  stenting to bilateral lower legs in [place]  one in 2003 and the other 2007,  no follow up since moved to [place]   Wears glasses     Past Surgical History:  Procedure Laterality Date   ABDOMINAL AORTOGRAM N/A 05/09/2024   Procedure: ABDOMINAL AORTOGRAM;  Surgeon: Lanis Fonda BRAVO, MD;  Location: [facility] INVASIVE CV LAB;  Service: Cardiovascular;  Laterality: N/A;   COLON SURGERY  2017   sigmoid colon perforation   COLOSTOMY REVERSAL  02-21-2017     [place], [place] (OR record scanned in epic)   w/ sigmoid and descending colectomy with appendectomy  CYSTOSCOPY WITH INJECTION N/A 12/19/2019   Procedure: CYSTOSCOPY WITH INJECTION;  Surgeon: Alvaro Hummer, MD;  Location: [facility];  Service: Urology;  Laterality: N/A;   CYSTOSCOPY WITH URETHRAL DILATATION N/A 12/19/2019   Procedure: CYSTOSCOPY WITH URETHRAL DILATATION;  Surgeon: Alvaro Hummer, MD;  Location: [facility];  Service: Urology;  Laterality: N/A;  45 MINS   ENDOVASCULAR STENT INSERTION  2003 and 2007  --- both done in [place]    bilateral legs   LOWER EXTREMITY ANGIOGRAPHY Right 05/09/2024   Procedure: Lower Extremity Angiography;  Surgeon: Lanis Fonda BRAVO, MD;  Location: [facility] INVASIVE CV LAB;  Service: Cardiovascular;  Laterality:  Right;   LOWER EXTREMITY INTERVENTION Right 05/09/2024   Procedure: LOWER EXTREMITY INTERVENTION;  Surgeon: Lanis Fonda BRAVO, MD;  Location: [facility] INVASIVE CV LAB;  Service: Cardiovascular;  Laterality: Right;   PARTIAL COLECTOMY  09-04-2016  in [place], [place]   w/  colostomy for perferated divierticulitis   PARTIAL KNEE ARTHROPLASTY Left 2017   PERIPHERAL INTRAVASCULAR LITHOTRIPSY Right 05/09/2024   Procedure: PERIPHERAL INTRAVASCULAR LITHOTRIPSY;  Surgeon: Lanis Fonda BRAVO, MD;  Location: [facility] INVASIVE CV LAB;  Service: Cardiovascular;  Laterality: Right;   PILONIDAL CYST EXCISION     TOTAL KNEE ARTHROPLASTY Right 2012   TRANSURETHRAL RESECTION OF PROSTATE N/A 08/15/2019   Procedure: TRANSURETHRAL RESECTION OF THE PROSTATE (TURP);  Surgeon: Alvaro Hummer, MD;  Location: [facility];  Service: Urology;  Laterality: N/A;    Current Outpatient Medications  Medication Sig Dispense Refill   amLODipine  (NORVASC ) 10 MG tablet Take 1 tablet (10 mg total) by mouth daily. 90 tablet 1   aspirin  EC 81 MG tablet Take 81 mg by mouth daily. Swallow whole.     atorvastatin  (LIPITOR) 40 MG tablet Take 1 tablet (40 mg total) by mouth daily. 90 tablet 3   Calcium  Carbonate-Vit D-Min (CALCIUM  1200 PO) Take 1,200 mg by mouth daily.     clopidogrel  (PLAVIX ) 75 MG tablet Take 1 tablet (75 mg total) by mouth daily. 30 tablet 11   COMIRNATY syringe      cyanocobalamin  (VITAMIN B12) 1000 MCG tablet Take 1,000 mcg by mouth daily.     doxepin  (SINEQUAN ) 25 MG capsule Take 1 capsule (25 mg total) by mouth daily. 90 capsule 1   finasteride  (PROSCAR ) 5 MG tablet Take 1 tablet (5 mg total) by mouth at bedtime. 90 tablet 3   fluticasone (FLONASE) 50 MCG/ACT nasal spray Place 1 spray into both nostrils daily as needed for allergies or rhinitis.     folic acid (FOLVITE) 1 MG tablet Take 1 mg by mouth daily.     loratadine (CLARITIN) 10 MG tablet Take 10 mg by mouth daily.     methotrexate (RHEUMATREX) 2.5 MG  tablet Take 15 mg by mouth once a week. Tuesday     naphazoline-pheniramine (ALLERGY EYE) 0.025-0.3 % ophthalmic solution Place 1 drop into both eyes daily as needed for allergies.     predniSONE  (DELTASONE ) 1 MG tablet Take 2 mg by mouth daily.     tadalafil  (CIALIS ) 20 MG tablet Take 1 tablet (20 mg total) by mouth daily. 90 tablet 3   triamcinolone  ointment (KENALOG ) 0.5 % APPLY TO AFFECTED AREA(S) THREE TIMES A DAY 30 g 3   vitamin E 180 MG (400 UNITS) capsule Take 400 Units by mouth daily.     No current facility-administered medications for this visit.    Allergies as of 08/08/2024 - Review Complete 08/08/2024  Allergen Reaction Noted  Pollen extract Cough, Itching, and Other (See Comments) 01/20/2022    Family History  Problem Relation Age of Onset   Alzheimer's disease Mother    Arthritis Mother    Hyperlipidemia Mother    Hypertension Mother    Lung cancer Father        non smoker   Cancer Father    COPD Sister    Heart failure Sister    ALS Brother    Depression Brother    Early death Brother    Heart disease Sister    Colon cancer Neg Hx    Esophageal cancer Neg Hx    Pancreatic cancer Neg Hx    Stomach cancer Neg Hx    Liver disease Neg Hx    Rectal cancer Neg Hx     Review of Systems:    Constitutional: No weight loss, fever, chills, weakness or fatigue HEENT: Eyes: No change in vision               Ears, Nose, Throat:  No change in hearing or congestion Skin: No rash or itching Cardiovascular: No chest pain, chest pressure or palpitations   Respiratory: No SOB or cough Gastrointestinal: See HPI and otherwise negative Genitourinary: No dysuria or change in urinary frequency Neurological: No headache, dizziness or syncope Musculoskeletal: No new muscle or joint pain Hematologic: No bleeding or bruising Psychiatric: No history of depression or anxiety    Physical Exam:  Vital signs: BP (!) 144/78 (BP Location: Right Arm, Patient Position: Sitting,  Cuff Size: Normal)   Pulse 72   Ht 5' 10 (1.778 m)   Wt 187 lb (84.8 kg)   BMI 26.83 kg/m   Constitutional:   Pleasant male appears to be in NAD, Well developed, Well nourished, alert and cooperative Throat: Oral cavity and pharynx without inflammation, swelling or lesion.  Respiratory: Respirations even and unlabored. Lungs clear to auscultation bilaterally.   No wheezes, crackles, or rhonchi.  Cardiovascular: Normal S1, S2. Regular rate and rhythm. No peripheral edema, cyanosis or pallor.  Gastrointestinal:  Soft, nondistended, nontender. No rebound or guarding. Normal bowel sounds. No appreciable masses or hepatomegaly. Rectal:  Not performed.  Msk:  Symmetrical without gross deformities. Without edema, no deformity or joint abnormality.  Neurologic:  Alert and  oriented x4;  grossly normal neurologically.  Skin:   Dry and intact without significant lesions or rashes.  RELEVANT LABS AND IMAGING: CBC    Latest Ref Rng & Units 05/11/2024    8:55 AM 05/09/2024   10:18 AM 12/05/2023   11:10 AM  CBC  WBC 4.0 - 10.5 K/uL 7.0   7.3   Hemoglobin 13.0 - 17.0 g/dL 86.4  85.3  85.9   Hematocrit 39.0 - 52.0 % 40.9  43.0  42.7   Platelets 150.0 - 400.0 K/uL 242.0   229.0      CMP     Latest Ref Rng & Units 05/11/2024    8:55 AM 05/09/2024   10:18 AM 12/05/2023   11:10 AM  CMP  Glucose 70 - 99 mg/dL 87  98  90   BUN 6 - 23 mg/dL 12  15  19    Creatinine 0.40 - 1.50 mg/dL 9.06  8.99  9.15   Sodium 135 - 145 mEq/L 138  139  138   Potassium 3.5 - 5.1 mEq/L 4.0  4.4  4.2   Chloride 96 - 112 mEq/L 104  105  105   CO2 19 - 32 mEq/L 26  25   Calcium  8.4 - 10.5 mg/dL 9.4   9.2   Total Protein 6.0 - 8.3 g/dL 6.9   6.9   Total Bilirubin 0.2 - 1.2 mg/dL 0.5   0.4   Alkaline Phos 39 - 117 U/L 78   75   AST 0 - 37 U/L 13   18   ALT 0 - 53 U/L 13   19      Lab Results  Component Value Date   TSH 1.34 05/11/2024  07/11/23 colonoscopy  - Three 3 to 5 mm polyps in the transverse colon and in  the ascending colon, removed with a cold snare. Resected and retrieved. - Patent end- to- side colo- colonic anastomosis, characterized by healthy appearing mucosa. - One 4 mm polyp at the recto- sigmoid colon, removed with a cold snare. Resected and retrieved. - Diverticulosis in the entire examined colon. - Normal mucosa in the entire examined colon. Biopsied. - Non- bleeding internal hemorrhoids. Path: Surgical [P], colon, transverse and ascending, polyp (3) TUBULAR ADENOMA, 4 FRAGMENTS NEGATIVE FOR HIGH-GRADE DYSPLASIA AND CARCINOMA 2. Surgical [P], right colon bx BENIGN COLONIC MUCOSA WITH NO DIAGNOSTIC ABNORMALITY 3. Surgical [P], left colon bx BENIGN COLONIC MUCOSA WITH NO DIAGNOSTIC ABNORMALITIES 4. Surgical [P], colon, rectum, polyp (1) TUBULAR ADENOMA NEGATIVE FOR HIGH-GRADE DYSPLASIA AND CARCINOMA  01/2017 colonoscopy in mid [facility] [place]  showed moderate diverticulosis, fair/good prep recall 5 years. Prior to reversal.   2022 CT abdomen pelvis without contrast for nausea and abdominal pain changes consistent with partial small bowel obstruction in the mid to distal jejunum felt to be related to adhesions, more distal small bowel is within normal limits no other acute abnormality, aortic atherosclerosis   Imaging: 05/11/24 CTAP -IMPRESSION: 1. Mild wall thickening throughout the distal small bowel and cecum, which Nautika Cressey be due to underdistension. Alternatively, this could represent changes of an infectious or inflammatory enterocolitis. 2. Asymmetric enhancement along the lateral aspect of the left prostate gland. Correlation with serum PSA and other labs recommended to exclude underlying prostatitis. 3. Total colonic diverticulosis. No changes of acute diverticulitis. 4. Enteric contrast in the distal esophagus, possibly due to incomplete emptying or gastroesophageal reflux disease.   Assessment: Encounter Diagnoses  Name Primary?   Rectal urgency Yes   History  of partial colectomy     73 year old male patient with history of diverticulosis of colon without hemorrhage, s/p colectomy 2018 who presents with fecal urgency after meals. No abd pain or rectal bleeding. 7/24 colonoscopy with random bx, negative. CT scan abd with no diverticulitis. Mild wall thickening throughout the distal small bowel and cecum. Trated with 60 days of Augmentin , symptoms of diarrhea and nausea improved. We will add Psyllium husk to see if this alleviates his symptoms. If no improvement we discussed trial of Xifaxan and/or follow-up EGD to evaluate distal small bowel and cecum.  Plan: -recommend low fodmap diet -start psyllium husk 1 tsp po daily -if no improvement Blakelee Allington consider follow-up endoscopic procedure and/or trial Xifaxan  Thank you for the courtesy of this consult. Please call me with any questions or concerns.   Deijah Spikes, FNP-C [facility] 08/08/2024, 10:33 AM  Cc: Kennyth Worth HERO, MD

## 2024-08-08 ENCOUNTER — Encounter: Payer: Self-pay | Admitting: Gastroenterology

## 2024-08-08 ENCOUNTER — Ambulatory Visit (INDEPENDENT_AMBULATORY_CARE_PROVIDER_SITE_OTHER): Admitting: Gastroenterology

## 2024-08-08 VITALS — BP 144/78 | HR 72 | Ht 70.0 in | Wt 187.0 lb

## 2024-08-08 DIAGNOSIS — R933 Abnormal findings on diagnostic imaging of other parts of digestive tract: Secondary | ICD-10-CM | POA: Diagnosis not present

## 2024-08-08 DIAGNOSIS — Z9049 Acquired absence of other specified parts of digestive tract: Secondary | ICD-10-CM

## 2024-08-08 DIAGNOSIS — R152 Fecal urgency: Secondary | ICD-10-CM | POA: Diagnosis not present

## 2024-08-08 NOTE — Patient Instructions (Addendum)
 Recommend low fodmap diet Avoid fatty foods,dairy, and sugary drinks which can worsen diarrhea. Avoid alcohol and limit caffeine.  Recommend psyllium husk 1 tsp po daily for 1 week, then can increase to 2 tsp po daily Can switch whey protein to plant based protein    Please update me via Mychart in 3-4 weeks on how things are going.  _______________________________________________________  If your blood pressure at your visit was 140/90 or greater, please contact your primary care physician to follow up on this.  _______________________________________________________  If you are age 74 or older, your body mass index should be between 23-30. Your Body mass index is 26.83 kg/m. If this is out of the aforementioned range listed, please consider follow up with your Primary Care Provider.  If you are age 31 or younger, your body mass index should be between 19-25. Your Body mass index is 26.83 kg/m. If this is out of the aformentioned range listed, please consider follow up with your Primary Care Provider.   ________________________________________________________  The [facility] providers would like to encourage you to use MYCHART to communicate with providers for non-urgent requests or questions.  Due to long hold times on the telephone, sending your provider a message by [facility] may be a faster and more efficient way to get a response.  Please allow 48 business hours for a response.  Please remember that this is for non-urgent requests.  _______________________________________________________  [facility] is using a team-based approach to care.  Your team is made up of your doctor and two to three APPS. Our APPS (Nurse Practitioners and Physician Assistants) work with your physician to ensure care continuity for you. They are fully qualified to address your health concerns and develop a treatment plan. They communicate directly with your gastroenterologist to care for you. Seeing  the Advanced Practice Practitioners on your physician's team can help you by facilitating care more promptly, often allowing for earlier appointments, access to diagnostic testing, procedures, and other specialty referrals.   Thank you for trusting me with your gastrointestinal care. Deanna May, FNP-C

## 2024-08-09 DIAGNOSIS — Z79899 Other long term (current) drug therapy: Secondary | ICD-10-CM | POA: Diagnosis not present

## 2024-08-09 NOTE — Progress Notes (Signed)
 Agree with the assessment and plan as outlined by [facility], FNP-C.  Carlitos Bottino, DO, [facility]

## 2024-08-17 ENCOUNTER — Other Ambulatory Visit: Payer: Self-pay | Admitting: *Deleted

## 2024-08-17 MED ORDER — DOXEPIN HCL 25 MG PO CAPS: 25.0000 mg | ORAL_CAPSULE | Freq: Every day | ORAL | 1 refills | Status: AC

## 2024-08-18 ENCOUNTER — Encounter: Payer: Self-pay | Admitting: Family Medicine

## 2024-08-21 NOTE — Telephone Encounter (Signed)
 Rx send to patient pharmacy on 08/18/2024

## 2024-09-03 ENCOUNTER — Other Ambulatory Visit: Payer: Self-pay | Admitting: Vascular Surgery

## 2024-09-03 DIAGNOSIS — Z95828 Presence of other vascular implants and grafts: Secondary | ICD-10-CM

## 2024-09-03 DIAGNOSIS — I70213 Atherosclerosis of native arteries of extremities with intermittent claudication, bilateral legs: Secondary | ICD-10-CM

## 2024-09-03 DIAGNOSIS — I739 Peripheral vascular disease, unspecified: Secondary | ICD-10-CM

## 2024-09-04 NOTE — Progress Notes (Unsigned)
 Office Note    HPI: Jim Lenker. is a 73 y.o. (Mar 20, 1951) male presenting in follow up status post 05/09/2024 superficial femoral artery and popliteal artery balloon lithotripsy, superficial femoral artery drug-coated balloon angioplasty,   A native of [place] , he lived there for years prior to moving to [place]  for 20 years.  He moved to [place]  to escape the cold after retirement.  He has 2 children, and continues to be happily married.  Jim Mathis has a history of bilateral iliac stents, right sided superficial femoral artery stent placed in [place]  over a decade ago.  Since his most recent invention, it has been doing well.  He is back to walking 5 miles a day.  He notes some mild claudication in the left calf at the 5 mile mark, but states that the right leg has improved dramatically since lithotripsy/DCB.  Denies rest pain, denies tissue loss. He is a former smoker.  Medications include high intensity statin Pletal    Past Medical History:  Diagnosis Date   Allergy 2022   getting more severe each year   Anxiety 30 years   anxiety while sleeping   Arthritis approx. 2016   lower back and neck, leads to headaches.   BPH with obstruction/lower urinary tract symptoms    Carotid stenosis, right    per pt was told he has some stenosis but not enough for surgery;   in epic under media ,  carotid ultrasound result right ICA 50-69% and incidental finding thyroid  nodule   Cervicalgia    Jim Mathis (erectile dysfunction)    History of colon resection    per pt 12 hours after partial knee arthroplasty 2017,  s/p partial colectomy was possible from divertiulitis   Hyperlipidemia    Hypertension    Migraines    PAD (peripheral artery disease) (HCC) 08-14-2019  per pt when he walks (walks 2 miles a day) right foot gets numb but recovers quickly and when he rides his bike left hand gets numb by recovers quickly ;   denies claudication or swelling   per pt s/p  stenting to bilateral lower legs  in [place]  one in 2003 and the other 2007,  no follow up since moved to [place]   Wears glasses     Past Surgical History:  Procedure Laterality Date   ABDOMINAL AORTOGRAM N/A 05/09/2024   Procedure: ABDOMINAL AORTOGRAM;  Surgeon: Lanis Fonda BRAVO, MD;  Location: [facility] INVASIVE CV LAB;  Service: Cardiovascular;  Laterality: N/A;   COLON SURGERY  2017   sigmoid colon perforation   COLOSTOMY REVERSAL  02-21-2017     [facility] [facility], [place] (OR record scanned in epic)   w/ sigmoid and descending colectomy with appendectomy   CYSTOSCOPY WITH INJECTION N/A 12/19/2019   Procedure: CYSTOSCOPY WITH INJECTION;  Surgeon: Alvaro Hummer, MD;  Location: [facility];  Service: Urology;  Laterality: N/A;   CYSTOSCOPY WITH URETHRAL DILATATION N/A 12/19/2019   Procedure: CYSTOSCOPY WITH URETHRAL DILATATION;  Surgeon: Alvaro Hummer, MD;  Location: [facility];  Service: Urology;  Laterality: N/A;  45 MINS   ENDOVASCULAR STENT INSERTION  2003 and 2007  --- both done in [place]    bilateral legs   LOWER EXTREMITY ANGIOGRAPHY Right 05/09/2024   Procedure: Lower Extremity Angiography;  Surgeon: Lanis Fonda BRAVO, MD;  Location: [facility] INVASIVE CV LAB;  Service: Cardiovascular;  Laterality: Right;   LOWER EXTREMITY INTERVENTION Right 05/09/2024   Procedure: LOWER EXTREMITY INTERVENTION;  Surgeon: Lanis Fonda BRAVO, MD;  Location: [facility] INVASIVE  CV LAB;  Service: Cardiovascular;  Laterality: Right;   PARTIAL COLECTOMY  09-04-2016  in [place], [place]   w/  colostomy for perferated divierticulitis   PARTIAL KNEE ARTHROPLASTY Left 2017   PERIPHERAL INTRAVASCULAR LITHOTRIPSY Right 05/09/2024   Procedure: PERIPHERAL INTRAVASCULAR LITHOTRIPSY;  Surgeon: Lanis Fonda BRAVO, MD;  Location: [facility] INVASIVE CV LAB;  Service: Cardiovascular;  Laterality: Right;   PILONIDAL CYST EXCISION     TOTAL KNEE ARTHROPLASTY Right 2012   TRANSURETHRAL RESECTION OF PROSTATE N/A 08/15/2019   Procedure: TRANSURETHRAL RESECTION OF THE PROSTATE  (TURP);  Surgeon: Alvaro Hummer, MD;  Location: [facility];  Service: Urology;  Laterality: N/A;    Social History   Socioeconomic History   Marital status: Married    Spouse name: Not on file   Number of children: Not on file   Years of education: Not on file   Highest education level: Master's degree (e.g., MA, MS, MEng, MEd, MSW, MBA)  Occupational History   Not on file  Tobacco Use   Smoking status: Former    Current packs/day: 0.00    Average packs/day: 1 pack/day for 20.0 years (20.0 ttl pk-yrs)    Types: Cigarettes, Cigars    Start date: 05/23/1974    Quit date: 05/23/1994    Years since quitting: 30.3   Smokeless tobacco: Never   Tobacco comments:    tobacco free 29 years  Vaping Use   Vaping status: Never Used  Substance and Sexual Activity   Alcohol use: Yes    Alcohol/week: 4.0 standard drinks of alcohol    Types: 2 Glasses of wine, 2 Cans of beer per week    Comment: occasionally   Drug use: Never   Sexual activity: Yes    Birth control/protection: None  Other Topics Concern   Not on file  Social History Narrative   Right handed   Lives in two story home with wife   Social Drivers of Health   Financial Resource Strain: Low Risk  (05/10/2024)   Overall Financial Resource Strain (CARDIA)    Difficulty of Paying Living Expenses: Not very hard  Food Insecurity: No Food Insecurity (05/10/2024)   Hunger Vital Sign    Worried About Running Out of Food in the Last Year: Never true    Ran Out of Food in the Last Year: Never true  Transportation Needs: No Transportation Needs (05/10/2024)   PRAPARE - Administrator, Civil Service (Medical): No    Lack of Transportation (Non-Medical): No  Physical Activity: Sufficiently Active (05/10/2024)   Exercise Vital Sign    Days of Exercise per Week: 3 days    Minutes of Exercise per Session: 90 min  Stress: No Stress Concern Present (05/10/2024)   [employer] of Occupational Health -  Occupational Stress Questionnaire    Feeling of Stress : Only a little  Social Connections: Socially Integrated (05/10/2024)   Social Connection and Isolation Panel    Frequency of Communication with Friends and Family: More than three times a week    Frequency of Social Gatherings with Friends and Family: Once a week    Attends Religious Services: More than 4 times per year    Active Member of [employer] or Organizations: Yes    Attends Banker Meetings: 1 to 4 times per year    Marital Status: Married  Catering manager Violence: Not At Risk (05/16/2024)   Humiliation, Afraid, Rape, and Kick questionnaire    Fear of Current or Ex-Partner:  No    Emotionally Abused: No    Physically Abused: No    Sexually Abused: No   Family History  Problem Relation Age of Onset   Alzheimer's disease Mother    Arthritis Mother    Hyperlipidemia Mother    Hypertension Mother    Lung cancer Father        non smoker   Cancer Father    COPD Sister    Heart failure Sister    ALS Brother    Depression Brother    Early death Brother    Heart disease Sister    Colon cancer Neg Hx    Esophageal cancer Neg Hx    Pancreatic cancer Neg Hx    Stomach cancer Neg Hx    Liver disease Neg Hx    Rectal cancer Neg Hx     Current Outpatient Medications  Medication Sig Dispense Refill   amLODipine  (NORVASC ) 10 MG tablet Take 1 tablet (10 mg total) by mouth daily. 90 tablet 1   aspirin  EC 81 MG tablet Take 81 mg by mouth daily. Swallow whole.     atorvastatin  (LIPITOR) 40 MG tablet Take 1 tablet (40 mg total) by mouth daily. 90 tablet 3   Calcium  Carbonate-Vit D-Min (CALCIUM  1200 PO) Take 1,200 mg by mouth daily.     clopidogrel  (PLAVIX ) 75 MG tablet Take 1 tablet (75 mg total) by mouth daily. 30 tablet 11   COMIRNATY syringe      cyanocobalamin  (VITAMIN B12) 1000 MCG tablet Take 1,000 mcg by mouth daily.     doxepin  (SINEQUAN ) 25 MG capsule Take 1 capsule (25 mg total) by mouth daily. 90 capsule 1    finasteride  (PROSCAR ) 5 MG tablet Take 1 tablet (5 mg total) by mouth at bedtime. 90 tablet 3   fluticasone (FLONASE) 50 MCG/ACT nasal spray Place 1 spray into both nostrils daily as needed for allergies or rhinitis.     folic acid (FOLVITE) 1 MG tablet Take 1 mg by mouth daily.     loratadine (CLARITIN) 10 MG tablet Take 10 mg by mouth daily.     methotrexate (RHEUMATREX) 2.5 MG tablet Take 15 mg by mouth once a week. Tuesday     naphazoline-pheniramine (ALLERGY EYE) 0.025-0.3 % ophthalmic solution Place 1 drop into both eyes daily as needed for allergies.     predniSONE  (DELTASONE ) 1 MG tablet Take 2 mg by mouth daily.     tadalafil  (CIALIS ) 20 MG tablet Take 1 tablet (20 mg total) by mouth daily. 90 tablet 3   triamcinolone  ointment (KENALOG ) 0.5 % APPLY TO AFFECTED AREA(S) THREE TIMES A DAY 30 g 3   vitamin E 180 MG (400 UNITS) capsule Take 400 Units by mouth daily.     No current facility-administered medications for this visit.    Allergies  Allergen Reactions   Pollen Extract Cough, Itching and Other (See Comments)     REVIEW OF SYSTEMS:  [X]  denotes positive finding, [ ]  denotes negative finding Cardiac  Comments:  Chest pain or chest pressure:    Shortness of breath upon exertion:    Short of breath when lying flat:    Irregular heart rhythm:        Vascular    Pain in calf, thigh, or hip brought on by ambulation:    Pain in feet at night that wakes you up from your sleep:     Blood clot in your veins:    Leg swelling:  Pulmonary    Oxygen at home:    Productive cough:     Wheezing:         Neurologic    Sudden weakness in arms or legs:     Sudden numbness in arms or legs:     Sudden onset of difficulty speaking or slurred speech:    Temporary loss of vision in one eye:     Problems with dizziness:         Gastrointestinal    Blood in stool:     Vomited blood:         Genitourinary    Burning when urinating:     Blood in urine:         Psychiatric    Major depression:         Hematologic    Bleeding problems:    Problems with blood clotting too easily:        Skin    Rashes or ulcers:        Constitutional    Fever or chills:      PHYSICAL EXAMINATION:  There were no vitals filed for this visit.   General:  WDWN in NAD; vital signs documented above Gait: Not observed HENT: WNL, normocephalic Pulmonary: normal non-labored breathing , without wheezing Cardiac: regular HR Abdomen: soft, NT, no masses Skin: without rashes Vascular Exam/Pulses:  Right Left  Radial 2+ (normal) 2+ (normal)  Ulnar    Femoral 2+ (normal) 2+ (normal)  Popliteal    DP absent absent  PT 2+ 2+ (normal)   Extremities: without ischemic changes, without Gangrene , without cellulitis; without open wounds;  Musculoskeletal: no muscle wasting or atrophy  Neurologic: A&O X 3;  No focal weakness or paresthesias are detected Psychiatric:  The pt has Normal affect.   Non-Invasive Vascular Imaging:     ABI Findings:   +-------+-----------+-----------+------------+------------+  ABI/TBIToday's ABIToday's TBIPrevious ABIPrevious TBI  +-------+-----------+-----------+------------+------------+  Right 0.96       0.62       0.60        0.45          +-------+-----------+-----------+------------+------------+  Left  1.10       0.56       0.82        0.52          +-------+-----------+-----------+------------+------------+       +-----------+--------+-----+---------------+----------+--------+  RIGHT     PSV cm/sRatioStenosis       Waveform  Comments  +-----------+--------+-----+---------------+----------+--------+  CFA Prox   316          50-74% stenosismonophasicbrisk     +-----------+--------+-----+---------------+----------+--------+  DFA       283          50-74% stenosisbiphasic            +-----------+--------+-----+---------------+----------+--------+  SFA Prox   288           50-74% stenosisbiphasic            +-----------+--------+-----+---------------+----------+--------+  SFA Mid    62                          biphasic            +-----------+--------+-----+---------------+----------+--------+  POP Prox   83                          biphasic            +-----------+--------+-----+---------------+----------+--------+  POP Mid    230          50-74% stenosisbiphasic            +-----------+--------+-----+---------------+----------+--------+  POP Distal 67                          biphasic            +-----------+--------+-----+---------------+----------+--------+  TP Trunk   77                          biphasic            +-----------+--------+-----+---------------+----------+--------+  ATA Distal                                       NV        +-----------+--------+-----+---------------+----------+--------+  PTA Distal 39                          biphasic            +-----------+--------+-----+---------------+----------+--------+  PERO Distal                                      NV        +-----------+--------+-----+---------------+----------+--------+       Right Stent(s):  +---------------+--------+--------+--------+--------+  SFA           PSV cm/sStenosisWaveformComments  +---------------+--------+--------+--------+--------+  Prox to Stent  85              biphasic          +---------------+--------+--------+--------+--------+  Proximal Stent 86              biphasic          +---------------+--------+--------+--------+--------+  Mid Stent      100             biphasic          +---------------+--------+--------+--------+--------+  Distal Stent   95              biphasic          +---------------+--------+--------+--------+--------+  Distal to Stent85              biphasic          +---------------+--------+--------+--------+--------+       ASSESSMENT/PLAN: Jim Mickiel Raddle. is a 73 y.o. male presenting in follow up status post 05/09/2024 superficial femoral artery and popliteal artery balloon lithotripsy, superficial femoral artery drug-coated balloon angioplasty for progressive claudication and threatened right sided superficial femoral artery stent.    Overall he is doing well.  Back to walking 5 miles a day. Imaging was reviewed.  ABI improved dramatically. CTA was also reviewed demonstrating significant calcific disease involving the common femoral artery, proximal SFA.  This is something that I will have to watch closely as velocities on arterial duplex were elevated at this level.  At this time, with a palpable pulse in the foot, my plan is to follow him up in 3 months with repeat study. Should he have restenosis, would discuss common femoral endarterectomy.  I asked him to continue walking is much as possible and call my office should claudication symptoms return. Asked him to continue his current medication regimen.  He can hold  cilostazol  moving forward.  [facility] [phone] Total time of patient care including pre-visit research, consultation, and documentation greater than 30 minutes

## 2024-09-06 ENCOUNTER — Ambulatory Visit (HOSPITAL_BASED_OUTPATIENT_CLINIC_OR_DEPARTMENT_OTHER)
Admission: RE | Admit: 2024-09-06 | Discharge: 2024-09-06 | Disposition: A | Discharge status: Home / Self Care | Source: Ambulatory Visit | Attending: Vascular Surgery | Admitting: Vascular Surgery

## 2024-09-06 ENCOUNTER — Encounter: Payer: Self-pay | Admitting: Vascular Surgery

## 2024-09-06 ENCOUNTER — Ambulatory Visit (INDEPENDENT_AMBULATORY_CARE_PROVIDER_SITE_OTHER): Admitting: Vascular Surgery

## 2024-09-06 ENCOUNTER — Ambulatory Visit (HOSPITAL_COMMUNITY)
Admission: RE | Admit: 2024-09-06 | Discharge: 2024-09-06 | Disposition: A | Discharge status: Home / Self Care | Source: Ambulatory Visit | Attending: Vascular Surgery | Admitting: Vascular Surgery

## 2024-09-06 VITALS — BP 135/74 | HR 65 | Temp 97.9°F | Resp 16 | Ht 70.0 in | Wt 183.9 lb

## 2024-09-06 DIAGNOSIS — I739 Peripheral vascular disease, unspecified: Secondary | ICD-10-CM | POA: Insufficient documentation

## 2024-09-06 DIAGNOSIS — Z95828 Presence of other vascular implants and grafts: Secondary | ICD-10-CM | POA: Diagnosis not present

## 2024-09-06 DIAGNOSIS — I70213 Atherosclerosis of native arteries of extremities with intermittent claudication, bilateral legs: Secondary | ICD-10-CM | POA: Insufficient documentation

## 2024-09-06 LAB — VAS US ABI WITH/WO TBI
Left ABI: 1.04
Right ABI: 0.98

## 2024-09-07 ENCOUNTER — Other Ambulatory Visit: Payer: Self-pay

## 2024-09-07 DIAGNOSIS — I70229 Atherosclerosis of native arteries of extremities with rest pain, unspecified extremity: Secondary | ICD-10-CM

## 2024-09-07 DIAGNOSIS — Z95828 Presence of other vascular implants and grafts: Secondary | ICD-10-CM

## 2024-09-07 DIAGNOSIS — I739 Peripheral vascular disease, unspecified: Secondary | ICD-10-CM

## 2024-10-03 ENCOUNTER — Encounter: Payer: Self-pay | Admitting: Family Medicine

## 2024-10-04 ENCOUNTER — Other Ambulatory Visit: Payer: Self-pay | Admitting: *Deleted

## 2024-10-04 MED ORDER — TADALAFIL 20 MG PO TABS: 20.0000 mg | ORAL_TABLET | Freq: Every day | ORAL | 3 refills | Status: AC

## 2024-10-05 DIAGNOSIS — M810 Age-related osteoporosis without current pathological fracture: Secondary | ICD-10-CM | POA: Diagnosis not present

## 2024-10-05 DIAGNOSIS — Z13820 Encounter for screening for osteoporosis: Secondary | ICD-10-CM | POA: Diagnosis not present

## 2024-10-05 DIAGNOSIS — M0609 Rheumatoid arthritis without rheumatoid factor, multiple sites: Secondary | ICD-10-CM | POA: Diagnosis not present

## 2024-10-05 DIAGNOSIS — Z7952 Long term (current) use of systemic steroids: Secondary | ICD-10-CM | POA: Diagnosis not present

## 2024-10-05 DIAGNOSIS — R011 Cardiac murmur, unspecified: Secondary | ICD-10-CM | POA: Diagnosis not present

## 2024-10-05 DIAGNOSIS — M1990 Unspecified osteoarthritis, unspecified site: Secondary | ICD-10-CM | POA: Diagnosis not present

## 2024-10-05 DIAGNOSIS — M542 Cervicalgia: Secondary | ICD-10-CM | POA: Diagnosis not present

## 2024-10-05 DIAGNOSIS — M353 Polymyalgia rheumatica: Secondary | ICD-10-CM | POA: Diagnosis not present

## 2024-10-05 DIAGNOSIS — Z79899 Other long term (current) drug therapy: Secondary | ICD-10-CM | POA: Diagnosis not present

## 2024-10-09 ENCOUNTER — Other Ambulatory Visit: Payer: Self-pay | Admitting: Family Medicine

## 2024-10-09 DIAGNOSIS — N35814 Other anterior urethral stricture, male: Secondary | ICD-10-CM | POA: Diagnosis not present

## 2024-10-09 DIAGNOSIS — R3915 Urgency of urination: Secondary | ICD-10-CM | POA: Diagnosis not present

## 2024-10-09 NOTE — Telephone Encounter (Unsigned)
 Copied from CRM #[id number]. Topic: Clinical - Medication Refill >> Oct 09, 2024  3:46 PM Suzen RAMAN wrote: Medication: amLODipine  (NORVASC ) 10 MG tablet   Has the patient contacted their pharmacy? Yes   This is the patient's preferred pharmacy:  [facility], [place] - [id number] [place] [address] [place] [id number] Phone: [phone] Fax: [phone]  Is this the correct pharmacy for this prescription? Yes If no, delete pharmacy and type the correct one.   Has the prescription been filled recently? No  Is the patient out of the medication? No  Has the patient been seen for an appointment in the last year OR does the patient have an upcoming appointment? Yes  Can we respond through MyChart? Yes  Agent: Please be advised that Rx refills may take up to 3 business days. We ask that you follow-up with your pharmacy.

## 2024-10-10 ENCOUNTER — Encounter: Payer: Self-pay | Admitting: Family Medicine

## 2024-10-10 MED ORDER — AMLODIPINE BESYLATE 10 MG PO TABS: 10.0000 mg | ORAL_TABLET | Freq: Every day | ORAL | 1 refills | Status: AC

## 2024-10-12 NOTE — Progress Notes (Signed)
 Jim Mathis.                                          MRN: [id number]   10/12/2024   The [facility] Quality Team Specialist reviewed this patient medical record for the purposes of chart review for care gap closure. The following were reviewed: abstraction for care gap closure-controlling blood pressure.    [facility] Quality Team

## 2024-10-24 DIAGNOSIS — R208 Other disturbances of skin sensation: Secondary | ICD-10-CM | POA: Diagnosis not present

## 2024-10-24 DIAGNOSIS — L708 Other acne: Secondary | ICD-10-CM | POA: Diagnosis not present

## 2024-10-24 DIAGNOSIS — L538 Other specified erythematous conditions: Secondary | ICD-10-CM | POA: Diagnosis not present

## 2024-10-24 DIAGNOSIS — L820 Inflamed seborrheic keratosis: Secondary | ICD-10-CM | POA: Diagnosis not present

## 2024-10-24 DIAGNOSIS — D2272 Melanocytic nevi of left lower limb, including hip: Secondary | ICD-10-CM | POA: Diagnosis not present

## 2024-10-24 DIAGNOSIS — D2271 Melanocytic nevi of right lower limb, including hip: Secondary | ICD-10-CM | POA: Diagnosis not present

## 2024-10-24 DIAGNOSIS — D225 Melanocytic nevi of trunk: Secondary | ICD-10-CM | POA: Diagnosis not present

## 2024-10-24 DIAGNOSIS — D2261 Melanocytic nevi of right upper limb, including shoulder: Secondary | ICD-10-CM | POA: Diagnosis not present

## 2024-10-24 DIAGNOSIS — D2262 Melanocytic nevi of left upper limb, including shoulder: Secondary | ICD-10-CM | POA: Diagnosis not present

## 2024-12-06 ENCOUNTER — Encounter: Payer: Self-pay | Admitting: Family Medicine

## 2024-12-06 ENCOUNTER — Ambulatory Visit: Payer: PPO | Admitting: Family Medicine

## 2024-12-06 VITALS — BP 132/78 | HR 85 | Temp 97.2°F | Ht 70.0 in | Wt 192.0 lb

## 2024-12-06 DIAGNOSIS — Z0001 Encounter for general adult medical examination with abnormal findings: Secondary | ICD-10-CM

## 2024-12-06 DIAGNOSIS — R0600 Dyspnea, unspecified: Secondary | ICD-10-CM | POA: Diagnosis not present

## 2024-12-06 DIAGNOSIS — I10 Essential (primary) hypertension: Secondary | ICD-10-CM | POA: Diagnosis not present

## 2024-12-06 DIAGNOSIS — M1990 Unspecified osteoarthritis, unspecified site: Secondary | ICD-10-CM

## 2024-12-06 DIAGNOSIS — R6882 Decreased libido: Secondary | ICD-10-CM | POA: Diagnosis not present

## 2024-12-06 DIAGNOSIS — F419 Anxiety disorder, unspecified: Secondary | ICD-10-CM

## 2024-12-06 DIAGNOSIS — R202 Paresthesia of skin: Secondary | ICD-10-CM | POA: Diagnosis not present

## 2024-12-06 DIAGNOSIS — N400 Enlarged prostate without lower urinary tract symptoms: Secondary | ICD-10-CM | POA: Diagnosis not present

## 2024-12-06 DIAGNOSIS — R7303 Prediabetes: Secondary | ICD-10-CM | POA: Diagnosis not present

## 2024-12-06 DIAGNOSIS — M353 Polymyalgia rheumatica: Secondary | ICD-10-CM

## 2024-12-06 DIAGNOSIS — E785 Hyperlipidemia, unspecified: Secondary | ICD-10-CM

## 2024-12-06 LAB — CBC WITH DIFFERENTIAL/PLATELET
Basophils Absolute: 0 K/uL (ref 0.0–0.1)
Basophils Relative: 0.7 % (ref 0.0–3.0)
Eosinophils Absolute: 0.2 K/uL (ref 0.0–0.7)
Eosinophils Relative: 3.7 % (ref 0.0–5.0)
HCT: 38.7 % — ABNORMAL LOW (ref 39.0–52.0)
Hemoglobin: 12.3 g/dL — ABNORMAL LOW (ref 13.0–17.0)
Lymphocytes Relative: 16.7 % (ref 12.0–46.0)
Lymphs Abs: 1 K/uL (ref 0.7–4.0)
MCHC: 31.9 g/dL (ref 30.0–36.0)
MCV: 86.5 fl (ref 78.0–100.0)
Monocytes Absolute: 0.7 K/uL (ref 0.1–1.0)
Monocytes Relative: 12.5 % — ABNORMAL HIGH (ref 3.0–12.0)
Neutro Abs: 3.8 K/uL (ref 1.4–7.7)
Neutrophils Relative %: 66.4 % (ref 43.0–77.0)
Platelets: 248 K/uL (ref 150.0–400.0)
RBC: 4.48 Mil/uL (ref 4.22–5.81)
RDW: 17.6 % — ABNORMAL HIGH (ref 11.5–15.5)
WBC: 5.7 K/uL (ref 4.0–10.5)

## 2024-12-06 LAB — COMPREHENSIVE METABOLIC PANEL WITH GFR
ALT: 44 U/L (ref 3–53)
AST: 39 U/L — ABNORMAL HIGH (ref 5–37)
Albumin: 4.2 g/dL (ref 3.5–5.2)
Alkaline Phosphatase: 80 U/L (ref 39–117)
BUN: 16 mg/dL (ref 6–23)
CO2: 26 meq/L (ref 19–32)
Calcium: 9.4 mg/dL (ref 8.4–10.5)
Chloride: 103 meq/L (ref 96–112)
Creatinine, Ser: 0.86 mg/dL (ref 0.40–1.50)
GFR: 85.48 mL/min (ref >60.00)
Glucose, Bld: 83 mg/dL (ref 70–99)
Potassium: 4.6 meq/L (ref 3.5–5.1)
Sodium: 138 meq/L (ref 135–145)
Total Bilirubin: 0.4 mg/dL (ref 0.2–1.2)
Total Protein: 6.7 g/dL (ref 6.0–8.3)

## 2024-12-06 LAB — LIPID PANEL
Cholesterol: 185 mg/dL (ref 28–200)
HDL: 60.1 mg/dL (ref >39.00)
LDL Cholesterol: 103 mg/dL — ABNORMAL HIGH (ref 10–99)
NonHDL: 124.55
Total CHOL/HDL Ratio: 3
Triglycerides: 109 mg/dL (ref 10.0–149.0)
VLDL: 21.8 mg/dL (ref 0.0–40.0)

## 2024-12-06 LAB — TSH: TSH: 1.99 u[IU]/mL (ref 0.35–5.50)

## 2024-12-06 LAB — HEMOGLOBIN A1C: Hgb A1c MFr Bld: 5.6 % (ref 4.6–6.5)

## 2024-12-06 LAB — PSA: PSA: 1.68 ng/mL (ref 0.10–4.00)

## 2024-12-06 LAB — TESTOSTERONE: Testosterone: 350.9 ng/dL (ref 300.00–890.00)

## 2024-12-06 NOTE — Assessment & Plan Note (Signed)
 Stable on tadalafil  20 mg daily and finasteride  5 mg daily.  Check PSA.  He has also noticed decreased libido recently.  Will check testosterone  level with labs.

## 2024-12-06 NOTE — Assessment & Plan Note (Signed)
 He will follow-up with sports medicine soon.  Having more pain in his left arm and shoulder.

## 2024-12-06 NOTE — Progress Notes (Signed)
 Chief Complaint:  Jim Mathis. is a 73 y.o. male who presents today for his annual comprehensive physical exam.    Assessment/Plan:  New/Acute Problems: Dyspnea on exertion Patient with progressive dyspnea on exertion over the last several weeks to months.  Exam today is notable for 2 out of 6 systolic murmur which has been present in the past though somewhat harsher today.  He does have multiple risk factors including hypertension, dyslipidemia and prediabetes as well as known history of peripheral artery disease with claudication.  We will check echocardiogram today to further evaluate his systolic murmur however may need cardiology referral depending on results of this.  Chronic Problems Addressed Today: Essential hypertension Blood pressure at goal today on amlodipine  10 mg daily  Dyslipidemia Check lipids with labs.  He is on Lipitor 40 mg daily.  Polymyalgia rheumatica No longer on prednisone .  Working with rheumatology.  He is on methotrexate weekly.  Anxiety On doxepin  25 mg daily.  Symptoms are well-controlled with this regimen.  He is not having any current side effects.  BPH with ED and OAB Stable on tadalafil  20 mg daily and finasteride  5 mg daily.  Check PSA.  He has also noticed decreased libido recently.  Will check testosterone  level with labs.  Prediabetes Check A1c with labs  Osteoarthritis He will follow-up with sports medicine soon.  Having more pain in his left arm and shoulder.  Preventative Healthcare: Check labs.  Up-to-date on vaccines.  Due for next colonoscopy in 2034.  Patient Counseling(The following topics were reviewed and/or handout was given):  -Nutrition: Stressed importance of moderation in sodium/caffeine intake, saturated fat and cholesterol, caloric balance, sufficient intake of fresh fruits, vegetables, and fiber.  -Stressed the importance of regular exercise.   -Substance Abuse: Discussed cessation/primary prevention of tobacco,  alcohol, or other drug use; driving or other dangerous activities under the influence; availability of treatment for abuse.   -Injury prevention: Discussed safety belts, safety helmets, smoke detector, smoking near bedding or upholstery.   -Sexuality: Discussed sexually transmitted diseases, partner selection, use of condoms, avoidance of unintended pregnancy and contraceptive alternatives.   -Dental health: Discussed importance of regular tooth brushing, flossing, and dental visits.  -Health maintenance and immunizations reviewed. Please refer to Health maintenance section.  Return to care in 1 year for next preventative visit.     Subjective:  HPI:  He has no acute complaints today. Patient is here today for his annual physical.  See assessment / plan for status of chronic conditions.  Discussed the use of AI scribe software for clinical note transcription with the patient, who gave verbal consent to proceed.  History of Present Illness Jim Mathis. Ed is a 73 year old male who presents for an annual physical exam and reports loss of interest in sex.  He has experienced a loss of interest in sex, which he suspects may be related to low testosterone  or his medications. He has been on doxepin  for anxiety for many years and recently completed a course of prednisone , initially prescribed for polymyalgia and continued for rheumatoid arthritis (RA). He is currently off prednisone  and continues methotrexate for RA.  He experiences sleep disturbances, often waking up due to nightmares and needing to use the bathroom. He has attempted to discontinue doxepin  in the past, but alternative medications were ineffective. He continues to struggle with sleep despite the low dose of doxepin .  He reports getting winded more easily than before, particularly when hiking, which he does three  times a week for three to five miles. His feet fall asleep and become painful during these activities. He had stents  placed in 2007, and an angioplasty was attempted without significant improvement. He is on Plavix  for circulation issues.  He mentions a new onset of shoulder pain over the past six weeks, which wakes him up at night. His hand goes numb, and he has a history of carpal tunnel surgery. He suspects it might be neck-related as he is a side sleeper and has to shift positions frequently.  He maintains a diet with plenty of fruits and vegetables but admits to consuming sugar. He and his wife stay active by hiking regularly.        12/06/2024    9:20 AM  Depression screen PHQ 2/9  Decreased Interest 0  Down, Depressed, Hopeless 0  PHQ - 2 Score 0  Altered sleeping 0  Tired, decreased energy 0  Change in appetite 0  Feeling bad or failure about yourself  0  Trouble concentrating 0  Moving slowly or fidgety/restless 0  Suicidal thoughts 0  PHQ-9 Score 0  Difficult doing work/chores Not difficult at all    There are no preventive care reminders to display for this patient.   ROS: Per HPI, otherwise a complete review of systems was negative.   PMH:  The following were reviewed and entered/updated in epic: Past Medical History:  Diagnosis Date   Allergy 2022   getting more severe each year   Anxiety 30 years   anxiety while sleeping   Arthritis approx. 2016   lower back and neck, leads to headaches.   BPH with obstruction/lower urinary tract symptoms    Carotid stenosis, right    per pt was told he has some stenosis but not enough for surgery;   in epic under media ,  carotid ultrasound result right ICA 50-69% and incidental finding thyroid  nodule   Cervicalgia    ED (erectile dysfunction)    History of colon resection    per pt 12 hours after partial knee arthroplasty 2017,  s/p partial colectomy was possible from divertiulitis   Hyperlipidemia    Hypertension    Migraines    PAD (peripheral artery disease) 08-14-2019  per pt when he walks (walks 2 miles a day) right foot gets  numb but recovers quickly and when he rides his bike left hand gets numb by recovers quickly ;   denies claudication or swelling   per pt s/p  stenting to bilateral lower legs in [place]  one in 2003 and the other 2007,  no follow up since moved to [facility]   Wears glasses    Patient Active Problem List   Diagnosis Date Noted   Prediabetes 09/27/2023   Neuropathic pain 03/23/2023   Polymyalgia rheumatica 09/07/2022   Paresthesias 07/19/2022   SBO (small bowel obstruction) (HCC) 05/08/2021   PAD (peripheral artery disease)    Actinic keratosis 05/06/2020   Nonintractable headache 08/22/2019   BPH with ED and OAB 05/24/2019   Essential hypertension 05/24/2019   Dyslipidemia 05/24/2019   Anxiety 05/24/2019   Seborrheic dermatitis 05/24/2019   Osteoarthritis 07/13/2012   Past Surgical History:  Procedure Laterality Date   ABDOMINAL AORTOGRAM N/A 05/09/2024   Procedure: ABDOMINAL AORTOGRAM;  Surgeon: Lanis Fonda BRAVO, MD;  Location: [facility] INVASIVE CV LAB;  Service: Cardiovascular;  Laterality: N/A;   COLON SURGERY  2017   sigmoid colon perforation   COLOSTOMY REVERSAL  02-21-2017     [place], [place] (OR  record scanned in epic)   w/ sigmoid and descending colectomy with appendectomy   CYSTOSCOPY WITH INJECTION N/A 12/19/2019   Procedure: CYSTOSCOPY WITH INJECTION;  Surgeon: Alvaro Hummer, MD;  Location: [facility];  Service: Urology;  Laterality: N/A;   CYSTOSCOPY WITH URETHRAL DILATATION N/A 12/19/2019   Procedure: CYSTOSCOPY WITH URETHRAL DILATATION;  Surgeon: Alvaro Hummer, MD;  Location: [facility];  Service: Urology;  Laterality: N/A;  45 MINS   ENDOVASCULAR STENT INSERTION  2003 and 2007  --- both done in [place]    bilateral legs   LOWER EXTREMITY ANGIOGRAPHY Right 05/09/2024   Procedure: Lower Extremity Angiography;  Surgeon: Lanis Fonda BRAVO, MD;  Location: [facility] INVASIVE CV LAB;  Service: Cardiovascular;  Laterality: Right;   LOWER EXTREMITY INTERVENTION Right  05/09/2024   Procedure: LOWER EXTREMITY INTERVENTION;  Surgeon: Lanis Fonda BRAVO, MD;  Location: [facility] INVASIVE CV LAB;  Service: Cardiovascular;  Laterality: Right;   PARTIAL COLECTOMY  09-04-2016  in [place], [place]   w/  colostomy for perferated divierticulitis   PARTIAL KNEE ARTHROPLASTY Left 2017   PERIPHERAL INTRAVASCULAR LITHOTRIPSY Right 05/09/2024   Procedure: PERIPHERAL INTRAVASCULAR LITHOTRIPSY;  Surgeon: Lanis Fonda BRAVO, MD;  Location: [facility] INVASIVE CV LAB;  Service: Cardiovascular;  Laterality: Right;   PILONIDAL CYST EXCISION     TOTAL KNEE ARTHROPLASTY Right 2012   TRANSURETHRAL RESECTION OF PROSTATE N/A 08/15/2019   Procedure: TRANSURETHRAL RESECTION OF THE PROSTATE (TURP);  Surgeon: Alvaro Hummer, MD;  Location: [facility];  Service: Urology;  Laterality: N/A;    Family History  Problem Relation Age of Onset   Alzheimer's disease Mother    Arthritis Mother    Hyperlipidemia Mother    Hypertension Mother    Lung cancer Father        non smoker   Cancer Father    COPD Sister    Heart failure Sister    ALS Brother    Depression Brother    Early death Brother    Heart disease Sister    Colon cancer Neg Hx    Esophageal cancer Neg Hx    Pancreatic cancer Neg Hx    Stomach cancer Neg Hx    Liver disease Neg Hx    Rectal cancer Neg Hx     Medications- reviewed and updated Current Outpatient Medications  Medication Sig Dispense Refill   amLODipine  (NORVASC ) 10 MG tablet Take 1 tablet (10 mg total) by mouth daily. 90 tablet 1   aspirin  EC 81 MG tablet Take 81 mg by mouth daily. Swallow whole.     atorvastatin  (LIPITOR) 40 MG tablet Take 1 tablet (40 mg total) by mouth daily. 90 tablet 3   Calcium  Carbonate-Vit D-Min (CALCIUM  1200 PO) Take 1,200 mg by mouth daily.     clopidogrel  (PLAVIX ) 75 MG tablet Take 1 tablet (75 mg total) by mouth daily. 30 tablet 11   cyanocobalamin  (VITAMIN B12) 1000 MCG tablet Take 1,000 mcg by mouth daily.     doxepin   (SINEQUAN ) 25 MG capsule Take 1 capsule (25 mg total) by mouth daily. 90 capsule 1   finasteride  (PROSCAR ) 5 MG tablet Take 1 tablet (5 mg total) by mouth at bedtime. 90 tablet 3   fluticasone (FLONASE) 50 MCG/ACT nasal spray Place 1 spray into both nostrils daily as needed for allergies or rhinitis.     folic acid (FOLVITE) 1 MG tablet Take 1 mg by mouth daily.     loratadine (CLARITIN) 10 MG tablet Take 10 mg  by mouth daily.     methotrexate (RHEUMATREX) 2.5 MG tablet Take 15 mg by mouth once a week. Tuesday     naphazoline-pheniramine (ALLERGY EYE) 0.025-0.3 % ophthalmic solution Place 1 drop into both eyes daily as needed for allergies.     tadalafil  (CIALIS ) 20 MG tablet Take 1 tablet (20 mg total) by mouth daily. 90 tablet 3   triamcinolone  ointment (KENALOG ) 0.5 % APPLY TO AFFECTED AREA(S) THREE TIMES A DAY 30 g 3   vitamin E 180 MG (400 UNITS) capsule Take 400 Units by mouth daily.     No current facility-administered medications for this visit.    Allergies-reviewed and updated Allergies[1]  Social History   Socioeconomic History   Marital status: Married    Spouse name: Not on file   Number of children: Not on file   Years of education: Not on file   Highest education level: Master's degree (e.g., MA, MS, MEng, MEd, MSW, MBA)  Occupational History   Not on file  Tobacco Use   Smoking status: Former    Current packs/day: 0.00    Average packs/day: 1 pack/day for 20.0 years (20.0 ttl pk-yrs)    Types: Cigarettes, Cigars    Start date: 05/23/1974    Quit date: 05/23/1994    Years since quitting: 30.5   Smokeless tobacco: Never   Tobacco comments:    tobacco free 29 years  Vaping Use   Vaping status: Never Used  Substance and Sexual Activity   Alcohol use: Yes    Alcohol/week: 4.0 standard drinks of alcohol    Types: 2 Glasses of wine, 2 Cans of beer per week    Comment: occasionally   Drug use: Never   Sexual activity: Yes    Birth control/protection: None  Other  Topics Concern   Not on file  Social History Narrative   Right handed   Lives in two story home with wife   Social Drivers of Health   Tobacco Use: Medium Risk (12/06/2024)   Patient History    Smoking Tobacco Use: Former    Smokeless Tobacco Use: Never    Passive Exposure: Not on file  Financial Resource Strain: Low Risk (11/29/2024)   Overall Financial Resource Strain (CARDIA)    Difficulty of Paying Living Expenses: Not very hard  Food Insecurity: No Food Insecurity (11/29/2024)   Epic    Worried About Radiation Protection Practitioner of Food in the Last Year: Never true    Ran Out of Food in the Last Year: Never true  Transportation Needs: No Transportation Needs (11/29/2024)   Epic    Lack of Transportation (Medical): No    Lack of Transportation (Non-Medical): No  Physical Activity: Sufficiently Active (11/29/2024)   Exercise Vital Sign    Days of Exercise per Week: 3 days    Minutes of Exercise per Session: 60 min  Stress: Stress Concern Present (11/29/2024)   [employer] of Occupational Health - Occupational Stress Questionnaire    Feeling of Stress: To some extent  Social Connections: Socially Integrated (11/29/2024)   Social Connection and Isolation Panel    Frequency of Communication with Friends and Family: Twice a week    Frequency of Social Gatherings with Friends and Family: Once a week    Attends Religious Services: 1 to 4 times per year    Active Member of [employer] or Organizations: Yes    Attends Banker Meetings: 1 to 4 times per year    Marital Status: Married  Depression (  PHQ2-9): Low Risk (12/06/2024)   Depression (PHQ2-9)    PHQ-2 Score: 0  Alcohol Screen: Low Risk (11/29/2024)   Alcohol Screen    Last Alcohol Screening Score (AUDIT): 2  Housing: Low Risk (11/29/2024)   Epic    Unable to Pay for Housing in the Last Year: No    Number of Times Moved in the Last Year: 0    Homeless in the Last Year: No  Utilities: Not At Risk (05/16/2024)   [facility]  Utilities    Threatened with loss of utilities: No  Health Literacy: Adequate Health Literacy (05/16/2024)   B1300 Health Literacy    Frequency of need for help with medical instructions: Never        Objective:  Physical Exam: BP 132/78   Pulse 85   Temp (!) 97.2 F (36.2 C) (Temporal)   Ht 5' 10 (1.778 m)   Wt 192 lb (87.1 kg)   SpO2 98%   BMI 27.55 kg/m   Body mass index is 27.55 kg/m. Wt Readings from Last 3 Encounters:  12/06/24 192 lb (87.1 kg)  09/06/24 183 lb 14.4 oz (83.4 kg)  08/08/24 187 lb (84.8 kg)   Gen: NAD, resting comfortably HEENT: TMs normal bilaterally. OP clear. No thyromegaly noted.  CV: RRR with 2/6 systolic murmurs appreciated Pulm: NWOB, CTAB with no crackles, wheezes, or rhonchi GI: Normal bowel sounds present. Soft, Nontender, Nondistended. MSK: no edema, cyanosis, or clubbing noted Skin: warm, dry Neuro: CN2-12 grossly intact. Strength 5/5 in upper and lower extremities. Reflexes symmetric and intact bilaterally.  Psych: Normal affect and thought content     Fowler Antos M. Kennyth, MD 12/06/2024 10:24 AM     [1]  Allergies Allergen Reactions   Pollen Extract Cough, Itching and Other (See Comments)

## 2024-12-06 NOTE — Assessment & Plan Note (Signed)
Check A1c with labs. 

## 2024-12-06 NOTE — Assessment & Plan Note (Signed)
 Check lipids with labs.  He is on Lipitor 40 mg daily.

## 2024-12-06 NOTE — Assessment & Plan Note (Signed)
 On doxepin  25 mg daily.  Symptoms are well-controlled with this regimen.  He is not having any current side effects.

## 2024-12-06 NOTE — Patient Instructions (Signed)
 It was very nice to see you today!  VISIT SUMMARY: Today, you had your annual physical exam where we discussed several health concerns including your loss of interest in sex, sleep disturbances, and new shoulder pain. We reviewed your current medications and overall health maintenance.  YOUR PLAN: PERIPHERAL ARTERY DISEASE WITH NEUROPATHY: You experience chronic leg pain and numbness, especially during exercise. -Continue taking Plavix  as prescribed. -Maintain regular exercise, aiming to walk 3-5 miles three times a week.  POLYMYALGIA RHEUMATICA: Previously managed with prednisone , now discontinued, with no symptoms of recurrence. -No current treatment needed as symptoms have not recurred.  ANXIETY DISORDER: Chronic anxiety managed with doxepin . -Continue taking doxepin  as prescribed.  OSTEOARTHRITIS WITH RIGHT SHOULDER PAIN AND PARESTHESIA: New right shoulder pain and numbness, possibly due to osteoarthritis or a pinched nerve. -You are referred to an orthopedic specialist for evaluation and possible cortisone injection.  DYSLIPIDEMIA: Condition related to cholesterol levels. -Continue current management and medications as prescribed.  PREDIABETES: Condition where blood sugar levels are higher than normal but not yet diabetes. -Continue monitoring your blood sugar levels and maintain a balanced diet.  GENERAL HEALTH MAINTENANCE: You are up to date on your colonoscopy, flu shot, pneumonia shot, and shingles vaccine. You engage in regular physical activity and maintain a balanced diet. -Continue your current health maintenance regimen.  Return in about 1 year (around 12/06/2025) for Annual Physical.   Take care, Dr Kennyth  PLEASE NOTE:  If you had any lab tests, please let us  know if you have not heard back within a few days. You may see your results on mychart before we have a chance to review them but we will give you a call once they are reviewed by us .   If we ordered any  referrals today, please let us  know if you have not heard from their office within the next week.   If you had any urgent prescriptions sent in today, please check with the pharmacy within an hour of our visit to make sure the prescription was transmitted appropriately.   Please try these tips to maintain a healthy lifestyle:  Eat at least 3 REAL meals and 1-2 snacks per day.  Aim for no more than 5 hours between eating.  If you eat breakfast, please do so within one hour of getting up.   Each meal should contain half fruits/vegetables, one quarter protein, and one quarter carbs (no bigger than a computer mouse)  Cut down on sweet beverages. This includes juice, soda, and sweet tea.   Drink at least 1 glass of water with each meal and aim for at least 8 glasses per day  Exercise at least 150 minutes every week.    Preventive Care 65 Years and Older, Male Preventive care refers to lifestyle choices and visits with your health care provider that can promote health and wellness. Preventive care visits are also called wellness exams. What can I expect for my preventive care visit? Counseling During your preventive care visit, your health care provider may ask about your: Medical history, including: Past medical problems. Family medical history. History of falls. Current health, including: Emotional well-being. Home life and relationship well-being. Sexual activity. Memory and ability to understand (cognition). Lifestyle, including: Alcohol, nicotine or tobacco, and drug use. Access to firearms. Diet, exercise, and sleep habits. Work and work astronomer. Sunscreen use. Safety issues such as seatbelt and bike helmet use. Physical exam Your health care provider will check your: Height and weight. These may be used  to calculate your BMI (body mass index). BMI is a measurement that tells if you are at a healthy weight. Waist circumference. This measures the distance around your  waistline. This measurement also tells if you are at a healthy weight and may help predict your risk of certain diseases, such as type 2 diabetes and high blood pressure. Heart rate and blood pressure. Body temperature. Skin for abnormal spots. What immunizations do I need?  Vaccines are usually given at various ages, according to a schedule. Your health care provider will recommend vaccines for you based on your age, medical history, and lifestyle or other factors, such as travel or where you work. What tests do I need? Screening Your health care provider may recommend screening tests for certain conditions. This may include: Lipid and cholesterol levels. Diabetes screening. This is done by checking your blood sugar (glucose) after you have not eaten for a while (fasting). Hepatitis C test. Hepatitis B test. HIV (human immunodeficiency virus) test. STI (sexually transmitted infection) testing, if you are at risk. Lung cancer screening. Colorectal cancer screening. Prostate cancer screening. Abdominal aortic aneurysm (AAA) screening. You may need this if you are a current or former smoker. Talk with your health care provider about your test results, treatment options, and if necessary, the need for more tests. Follow these instructions at home: Eating and drinking  Eat a diet that includes fresh fruits and vegetables, whole grains, lean protein, and low-fat dairy products. Limit your intake of foods with high amounts of sugar, saturated fats, and salt. Take vitamin and mineral supplements as recommended by your health care provider. Do not drink alcohol if your health care provider tells you not to drink. If you drink alcohol: Limit how much you have to 0-2 drinks a day. Know how much alcohol is in your drink. In the [place] one drink equals one 12 oz bottle of beer (355 mL), one 5 oz glass of wine (148 mL), or one 1 oz glass of hard liquor (44 mL). Lifestyle Brush your teeth every  morning and night with fluoride toothpaste. Floss one time each day. Exercise for at least 30 minutes 5 or more days each week. Do not use any products that contain nicotine or tobacco. These products include cigarettes, chewing tobacco, and vaping devices, such as e-cigarettes. If you need help quitting, ask your health care provider. Do not use drugs. If you are sexually active, practice safe sex. Use a condom or other form of protection to prevent STIs. Take aspirin  only as told by your health care provider. Make sure that you understand how much to take and what form to take. Work with your health care provider to find out whether it is safe and beneficial for you to take aspirin  daily. Ask your health care provider if you need to take a cholesterol-lowering medicine (statin). Find healthy ways to manage stress, such as: Meditation, yoga, or listening to music. Journaling. Talking to a trusted person. Spending time with friends and family. Safety Always wear your seat belt while driving or riding in a vehicle. Do not drive: If you have been drinking alcohol. Do not ride with someone who has been drinking. When you are tired or distracted. While texting. If you have been using any mind-altering substances or drugs. Wear a helmet and other protective equipment during sports activities. If you have firearms in your house, make sure you follow all gun safety procedures. Minimize exposure to UV radiation to reduce your risk of skin cancer.  What's next? Visit your health care provider once a year for an annual wellness visit. Ask your health care provider how often you should have your eyes and teeth checked. Stay up to date on all vaccines. This information is not intended to replace advice given to you by your health care provider. Make sure you discuss any questions you have with your health care provider. Document Revised: 06/03/2021 Document Reviewed: 06/03/2021 Elsevier Patient  Education  2024 [employer].

## 2024-12-06 NOTE — Assessment & Plan Note (Signed)
 No longer on prednisone .  Working with rheumatology.  He is on methotrexate weekly.

## 2024-12-06 NOTE — Assessment & Plan Note (Signed)
Blood pressure at goal today on amlodipine 10 mg daily. 

## 2024-12-07 ENCOUNTER — Ambulatory Visit: Payer: Self-pay | Admitting: Family Medicine

## 2024-12-07 DIAGNOSIS — R7989 Other specified abnormal findings of blood chemistry: Secondary | ICD-10-CM

## 2024-12-07 DIAGNOSIS — I350 Nonrheumatic aortic (valve) stenosis: Secondary | ICD-10-CM

## 2024-12-07 NOTE — Progress Notes (Signed)
 His blood counts dropped a little bit.  This may explain some of his symptoms.  Recommend he come back to recheck CBC with differential, iron panel, B12, and folate.  One of his liver numbers was mildly elevated as well.  Please place future order for CMET and we can recheck this when he comes back in for labs.  His cholesterol was a little bit elevated but similar to last year.  We should try to push this less than 100 and ideally closer to 70.  Recommend increasing his Lipitor to 80 mg daily if he is agreeable.  We should recheck this in 3 to 6 months.  All of his other labs are at goal and we can recheck in a year.

## 2024-12-10 ENCOUNTER — Other Ambulatory Visit: Payer: Self-pay

## 2024-12-10 MED ORDER — ATORVASTATIN CALCIUM 80 MG PO TABS: 80.0000 mg | ORAL_TABLET | Freq: Every day | ORAL | 3 refills | Status: AC

## 2024-12-11 ENCOUNTER — Other Ambulatory Visit (INDEPENDENT_AMBULATORY_CARE_PROVIDER_SITE_OTHER)

## 2024-12-11 DIAGNOSIS — R7989 Other specified abnormal findings of blood chemistry: Secondary | ICD-10-CM

## 2024-12-11 LAB — CBC WITH DIFFERENTIAL/PLATELET
Basophils Absolute: 0 K/uL (ref 0.0–0.1)
Basophils Relative: 0.4 % (ref 0.0–3.0)
Eosinophils Absolute: 0.2 K/uL (ref 0.0–0.7)
Eosinophils Relative: 3.3 % (ref 0.0–5.0)
HCT: 38.8 % — ABNORMAL LOW (ref 39.0–52.0)
Hemoglobin: 12.6 g/dL — ABNORMAL LOW (ref 13.0–17.0)
Lymphocytes Relative: 11.1 % — ABNORMAL LOW (ref 12.0–46.0)
Lymphs Abs: 0.7 K/uL (ref 0.7–4.0)
MCHC: 32.4 g/dL (ref 30.0–36.0)
MCV: 86.9 fl (ref 78.0–100.0)
Monocytes Absolute: 0.6 K/uL (ref 0.1–1.0)
Monocytes Relative: 9.6 % (ref 3.0–12.0)
Neutro Abs: 4.9 K/uL (ref 1.4–7.7)
Neutrophils Relative %: 75.6 % (ref 43.0–77.0)
Platelets: 242 K/uL (ref 150.0–400.0)
RBC: 4.47 Mil/uL (ref 4.22–5.81)
RDW: 18 % — ABNORMAL HIGH (ref 11.5–15.5)
WBC: 6.4 K/uL (ref 4.0–10.5)

## 2024-12-11 LAB — FOLATE: Folate: >23.7 ng/mL

## 2024-12-11 LAB — VITAMIN B12: Vitamin B-12: 965 pg/mL — ABNORMAL HIGH (ref 211–911)

## 2024-12-12 ENCOUNTER — Encounter: Payer: Self-pay | Admitting: Family Medicine

## 2024-12-12 LAB — IRON,TIBC AND FERRITIN PANEL
%SAT: 19 % — ABNORMAL LOW (ref 20–48)
Ferritin: 12 ng/mL — ABNORMAL LOW (ref 24–380)
Iron: 76 ug/dL (ref 50–180)
TIBC: 399 ug/dL (ref 250–425)

## 2024-12-14 ENCOUNTER — Ambulatory Visit: Payer: Self-pay | Admitting: Family Medicine

## 2024-12-14 NOTE — Progress Notes (Signed)
 His iron counts are very low. It would be a good idea for him to start an iron supplement ferrous sulfate 65 mg every other day on an empty stomach with vitamin C, BUT we need to figure out why his iron is low. Next step would be to have him see gastroenterology to evaluate for any sources of internal bleeding. Please place referral.  We can recheck his iron levels in 3 months.   All of his other labs are at goal.

## 2024-12-14 NOTE — Telephone Encounter (Signed)
 See note

## 2024-12-14 NOTE — Telephone Encounter (Signed)
 Please see result note

## 2024-12-17 NOTE — Telephone Encounter (Signed)
 I think that would be a good idea. He can call their office to schedule an appointment.

## 2024-12-17 NOTE — Progress Notes (Signed)
 Noted patient response as Thank you.

## 2024-12-19 ENCOUNTER — Encounter: Payer: Self-pay | Admitting: Family Medicine

## 2024-12-20 ENCOUNTER — Telehealth: Admitting: Physician Assistant

## 2024-12-20 DIAGNOSIS — A084 Viral intestinal infection, unspecified: Secondary | ICD-10-CM

## 2024-12-20 MED ORDER — ONDANSETRON 4 MG PO TBDP: 4.0000 mg | ORAL_TABLET | Freq: Three times a day (TID) | ORAL | 0 refills | Status: DC | PRN

## 2024-12-20 MED ORDER — LOPERAMIDE HCL 2 MG PO CAPS: 2.0000 mg | ORAL_CAPSULE | ORAL | 0 refills | Status: DC | PRN

## 2024-12-20 NOTE — Progress Notes (Signed)
 " Virtual Visit Consent   Jim Mathis., you are scheduled for a virtual visit with a [facility] provider today. Just as with appointments in the office, your consent must be obtained to participate. Your consent will be active for this visit and any virtual visit you may have with one of our providers in the next 365 days. If you have a MyChart account, a copy of this consent can be sent to you electronically.  As this is a virtual visit, video technology does not allow for your provider to perform a traditional examination. This may limit your provider's ability to fully assess your condition. If your provider identifies any concerns that need to be evaluated in person or the need to arrange testing (such as labs, EKG, etc.), we will make arrangements to do so. Although advances in technology are sophisticated, we cannot ensure that it will always work on either your end or our end. If the connection with a video visit is poor, the visit may have to be switched to a telephone visit. With either a video or telephone visit, we are not always able to ensure that we have a secure connection.  By engaging in this virtual visit, you consent to the provision of healthcare and authorize for your insurance to be billed (if applicable) for the services provided during this visit. Depending on your insurance coverage, you may receive a charge related to this service.  I need to obtain your verbal consent now. Are you willing to proceed with your visit today? Jim Mathis. has provided verbal consent on 12/20/2024 for a virtual visit (video or telephone). Jim Mathis, [place]  Date: 12/20/2024 6:30 PM   Virtual Visit via Video Note   I, Jim Mathis, connected with  Jim Mathis.  ([id number][birth year]) on 12/20/2024 at  6:15 PM EST by a video-enabled telemedicine application and verified that I am speaking with the correct person using two identifiers.  Location: Patient: Virtual Visit  Location Patient: Home Provider: Virtual Visit Location Provider: Home Office   I discussed the limitations of evaluation and management by telemedicine and the availability of in person appointments. The patient expressed understanding and agreed to proceed.    History of Present Illness: Jim No. is a 74 y.o. who identifies as a male who was assigned male at birth, and is being seen today for GI symptoms starting Monday evening with frequent, loose stool overnight (non-bloody) with some development of nausea and non-bloody emesis the next morning. Notes since then continued nausea without vomiting. Diarrhea seems to wax and wane with symptoms present mostly in evening and overnight hours, and dissipating during the day. Notes low-grade fever initially that has resolved. Some lower abdominal cramping without focal abdominal pain. Denies change to urinary habits Denies recent travel or known sick contact. Has history of some intermittent GI issues for which he has an upcoming evaluation with Gastroenterology.  HPI: HPI  Problems:  Patient Active Problem List   Diagnosis Date Noted   Prediabetes 09/27/2023   Neuropathic pain 03/23/2023   Polymyalgia rheumatica 09/07/2022   Paresthesias 07/19/2022   SBO (small bowel obstruction) (HCC) 05/08/2021   PAD (peripheral artery disease)    Actinic keratosis 05/06/2020   Nonintractable headache 08/22/2019   BPH with ED and OAB 05/24/2019   Essential hypertension 05/24/2019   Dyslipidemia 05/24/2019   Anxiety 05/24/2019   Seborrheic dermatitis 05/24/2019   Osteoarthritis 07/13/2012    Allergies: Allergies[1] Medications: Current Medications[2]  Observations/Objective:  Patient is well-developed, well-nourished in no acute distress.  Resting comfortably  at home.  Head is normocephalic, atraumatic.  No labored breathing.  Speech is clear and coherent with logical content.  Patient is alert and oriented at baseline.   Assessment and  Plan: 1. Viral gastroenteritis (Primary) - ondansetron  (ZOFRAN -ODT) 4 MG disintegrating tablet; Take 1 tablet (4 mg total) by mouth every 8 (eight) hours as needed for nausea or vomiting.  Dispense: 20 tablet; Refill: 0 - loperamide (IMODIUM) 2 MG capsule; Take 1 capsule (2 mg total) by mouth as needed for diarrhea or loose stools.  Dispense: 30 capsule; Refill: 0  Seems viral in nature as is improving on its own with resolution of fever, and waxing/waning symptoms with diurnal pattern. Supportive measures and OTC medications reviewed. Zofran  ODT and immodium as directed. He is to follow-up with PCP regarding GI referral/appt. In-person evaluation precautions reviewed.   Follow Up Instructions: I discussed the assessment and treatment plan with the patient. The patient was provided an opportunity to ask questions and all were answered. The patient agreed with the plan and demonstrated an understanding of the instructions.  A copy of instructions were sent to the patient via MyChart unless otherwise noted below.   The patient was advised to call back or seek an in-person evaluation if the symptoms worsen or if the condition fails to improve as anticipated.    Jim Velma Lunger, PA-C    [1]  Allergies Allergen Reactions   Pollen Extract Cough, Itching and Other (See Comments)  [2]  Current Outpatient Medications:    atorvastatin  (LIPITOR) 80 MG tablet, Take 1 tablet (80 mg total) by mouth daily., Disp: 90 tablet, Rfl: 3   loperamide (IMODIUM) 2 MG capsule, Take 1 capsule (2 mg total) by mouth as needed for diarrhea or loose stools., Disp: 30 capsule, Rfl: 0   ondansetron  (ZOFRAN -ODT) 4 MG disintegrating tablet, Take 1 tablet (4 mg total) by mouth every 8 (eight) hours as needed for nausea or vomiting., Disp: 20 tablet, Rfl: 0   amLODipine  (NORVASC ) 10 MG tablet, Take 1 tablet (10 mg total) by mouth daily., Disp: 90 tablet, Rfl: 1   aspirin  EC 81 MG tablet, Take 81 mg by mouth daily.  Swallow whole., Disp: , Rfl:    Calcium  Carbonate-Vit D-Min (CALCIUM  1200 PO), Take 1,200 mg by mouth daily., Disp: , Rfl:    clopidogrel  (PLAVIX ) 75 MG tablet, Take 1 tablet (75 mg total) by mouth daily., Disp: 30 tablet, Rfl: 11   cyanocobalamin  (VITAMIN B12) 1000 MCG tablet, Take 1,000 mcg by mouth daily., Disp: , Rfl:    doxepin  (SINEQUAN ) 25 MG capsule, Take 1 capsule (25 mg total) by mouth daily., Disp: 90 capsule, Rfl: 1   finasteride  (PROSCAR ) 5 MG tablet, Take 1 tablet (5 mg total) by mouth at bedtime., Disp: 90 tablet, Rfl: 3   fluticasone (FLONASE) 50 MCG/ACT nasal spray, Place 1 spray into both nostrils daily as needed for allergies or rhinitis., Disp: , Rfl:    folic acid  (FOLVITE ) 1 MG tablet, Take 1 mg by mouth daily., Disp: , Rfl:    loratadine (CLARITIN) 10 MG tablet, Take 10 mg by mouth daily., Disp: , Rfl:    methotrexate (RHEUMATREX) 2.5 MG tablet, Take 15 mg by mouth once a week. Tuesday, Disp: , Rfl:    naphazoline-pheniramine (ALLERGY EYE) 0.025-0.3 % ophthalmic solution, Place 1 drop into both eyes daily as needed for allergies., Disp: , Rfl:    tadalafil  (CIALIS ) 20 MG  tablet, Take 1 tablet (20 mg total) by mouth daily., Disp: 90 tablet, Rfl: 3   triamcinolone  ointment (KENALOG ) 0.5 %, APPLY TO AFFECTED AREA(S) THREE TIMES A DAY, Disp: 30 g, Rfl: 3   vitamin E 180 MG (400 UNITS) capsule, Take 400 Units by mouth daily., Disp: , Rfl:   "

## 2024-12-20 NOTE — Patient Instructions (Signed)
 " Jim Mathis., thank you for joining Elsie Velma Lunger, PA-C for today's virtual visit.  While this provider is not your primary care provider (PCP), if your PCP is located in our provider database this encounter information will be shared with them immediately following your visit.   A [facility] MyChart account gives you access to today's visit and all your visits, tests, and labs performed at [facility]  click here if you don't have a [place] [facility] MyChart account or go to [email or web address]  Consent: (Patient) Jim Mathis. provided verbal consent for this virtual visit at the beginning of the encounter.  Current Medications:  Current Outpatient Medications:    atorvastatin  (LIPITOR) 80 MG tablet, Take 1 tablet (80 mg total) by mouth daily., Disp: 90 tablet, Rfl: 3   loperamide (IMODIUM) 2 MG capsule, Take 1 capsule (2 mg total) by mouth as needed for diarrhea or loose stools., Disp: 30 capsule, Rfl: 0   ondansetron  (ZOFRAN -ODT) 4 MG disintegrating tablet, Take 1 tablet (4 mg total) by mouth every 8 (eight) hours as needed for nausea or vomiting., Disp: 20 tablet, Rfl: 0   amLODipine  (NORVASC ) 10 MG tablet, Take 1 tablet (10 mg total) by mouth daily., Disp: 90 tablet, Rfl: 1   aspirin  EC 81 MG tablet, Take 81 mg by mouth daily. Swallow whole., Disp: , Rfl:    Calcium  Carbonate-Vit D-Min (CALCIUM  1200 PO), Take 1,200 mg by mouth daily., Disp: , Rfl:    clopidogrel  (PLAVIX ) 75 MG tablet, Take 1 tablet (75 mg total) by mouth daily., Disp: 30 tablet, Rfl: 11   cyanocobalamin  (VITAMIN B12) 1000 MCG tablet, Take 1,000 mcg by mouth daily., Disp: , Rfl:    doxepin  (SINEQUAN ) 25 MG capsule, Take 1 capsule (25 mg total) by mouth daily., Disp: 90 capsule, Rfl: 1   finasteride  (PROSCAR ) 5 MG tablet, Take 1 tablet (5 mg total) by mouth at bedtime., Disp: 90 tablet, Rfl: 3   fluticasone (FLONASE) 50 MCG/ACT nasal spray, Place 1 spray into both nostrils daily as needed for  allergies or rhinitis., Disp: , Rfl:    folic acid  (FOLVITE ) 1 MG tablet, Take 1 mg by mouth daily., Disp: , Rfl:    loratadine (CLARITIN) 10 MG tablet, Take 10 mg by mouth daily., Disp: , Rfl:    methotrexate (RHEUMATREX) 2.5 MG tablet, Take 15 mg by mouth once a week. Tuesday, Disp: , Rfl:    naphazoline-pheniramine (ALLERGY EYE) 0.025-0.3 % ophthalmic solution, Place 1 drop into both eyes daily as needed for allergies., Disp: , Rfl:    tadalafil  (CIALIS ) 20 MG tablet, Take 1 tablet (20 mg total) by mouth daily., Disp: 90 tablet, Rfl: 3   triamcinolone  ointment (KENALOG ) 0.5 %, APPLY TO AFFECTED AREA(S) THREE TIMES A DAY, Disp: 30 g, Rfl: 3   vitamin E 180 MG (400 UNITS) capsule, Take 400 Units by mouth daily., Disp: , Rfl:    Medications ordered in this encounter:  Meds ordered this encounter  Medications   ondansetron  (ZOFRAN -ODT) 4 MG disintegrating tablet    Sig: Take 1 tablet (4 mg total) by mouth every 8 (eight) hours as needed for nausea or vomiting.    Dispense:  20 tablet    Refill:  0    Supervising Provider:   LAMPTEY, PHILIP O [[id number]]   loperamide (IMODIUM) 2 MG capsule    Sig: Take 1 capsule (2 mg total) by mouth as needed for diarrhea or loose stools.    Dispense:  30 capsule  Refill:  0    Supervising Provider:   BLAISE ALEENE KIDD [[id number]]     *If you need refills on other medications prior to your next appointment, please contact your pharmacy*  Follow-Up: Call back or seek an in-person evaluation if the symptoms worsen or if the condition fails to improve as anticipated.  [facility] [phone]  Other Instructions Viral Gastroenteritis, Adult  Viral gastroenteritis is also known as the stomach flu. This condition may affect your stomach, your small intestine, and your large intestine. It can cause sudden watery poop (diarrhea), fever, and vomiting. This condition is caused by certain germs (viruses). These germs can be passed from person to  person very easily (are contagious). Having watery poop and vomiting can make you feel weak and cause you to not have enough water in your body (get dehydrated). This can make you tired and thirsty, make you have a dry mouth, and make it so you pee (urinate) less often. It is important to replace the fluids that you lose from having watery poop and vomiting. What are the causes? You can get sick by catching germs from other people. You can also get sick by: Eating food, drinking water, or touching a surface that has the germs on it (is contaminated). Sharing utensils or other personal items with a person who is sick. What increases the risk? Having a weak body defense system (immune system). Living with one or more children who are younger than 2 years. Living in a nursing home. Going on cruise ships. What are the signs or symptoms? Symptoms of this condition start suddenly. Symptoms may last for a few days or for as long as a week. Common symptoms include: Watery poop. Vomiting. Other symptoms include: Fever. Headache. Feeling tired (fatigue). Pain in the belly (abdomen). Chills. Feeling weak. Feeling like you may vomit (nauseous). Muscle aches. Not feeling hungry. How is this treated? This condition typically goes away on its own. The focus of treatment is to replace the fluids that you lose. This condition may be treated with: An ORS (oral rehydration solution). This is a drink that helps you replace fluids and minerals your body lost. It is sold at pharmacies and stores. Medicines to help with your symptoms. Probiotic supplements to reduce symptoms of watery poop. Fluids given through an IV tube, if needed. Older adults and people with other diseases or a weak body defense system are at higher risk for not having enough water in the body. Follow these instructions at home: Eating and drinking  Take an ORS as told by your doctor. Drink clear fluids in small amounts as you are  able. Clear fluids include: Water. Ice chips. Fruit juice that has water added to it (is diluted). Low-calorie sports drinks. Drink enough fluid to keep your pee (urine) pale yellow. Eat small amounts of healthy foods every 3-4 hours as you are able. This may include whole grains, fruits, vegetables, lean meats, and yogurt. Avoid fluids that have a lot of sugar or caffeine in them. This includes energy drinks, sports drinks, and soda. Avoid spicy or fatty foods. Avoid alcohol. General instructions  Wash your hands often. This is very important after you have watery poop or you vomit. If you cannot use soap and water, use hand sanitizer. Make sure that all people in your home wash their hands well and often. Take over-the-counter and prescription medicines only as told by your doctor. Rest at home while you get better. Watch your condition  for any changes. Take a warm bath to help with any burning or pain from having watery poop. Keep all follow-up visits. Contact a doctor if: You cannot keep fluids down. Your symptoms get worse. You have new symptoms. You feel light-headed or dizzy. You have muscle cramps. Get help right away if: You have chest pain. You have trouble breathing, or you are breathing very fast. You have a fast heartbeat. You feel very weak or you faint. You have a very bad headache, a stiff neck, or both. You have a rash. You have very bad pain, cramping, or bloating in your belly. Your skin feels cold and clammy. You feel mixed up (confused). You have pain when you pee. You have signs of not having enough water in the body, such as: Dark pee, hardly any pee, or no pee. Cracked lips. Dry mouth. Sunken eyes. Feeling very sleepy. Feeling weak. You have signs of bleeding, such as: You see blood in your vomit. Your vomit looks like coffee grounds. You have bloody or black poop or poop that looks like tar. These symptoms may be an emergency. Get help right  away. Call 911. Do not wait to see if the symptoms will go away. Do not drive yourself to the hospital. Summary Viral gastroenteritis is also known as the stomach flu. This condition can cause sudden watery poop (diarrhea), fever, and vomiting. These germs can be passed from person to person very easily. Take an ORS (oral rehydration solution) as told by your doctor. This is a drink that is sold at pharmacies and stores. Wash your hands often, especially after having watery poop or vomiting. If you cannot use soap and water, use hand sanitizer. This information is not intended to replace advice given to you by your health care provider. Make sure you discuss any questions you have with your health care provider. Document Revised: 10/05/2021 Document Reviewed: 10/05/2021 Elsevier Patient Education  2024 Elsevier Inc.   If you have been instructed to have an in-person evaluation today at a local Urgent Care facility, please use the link below. It will take you to a list of all of our available [facility], including address, phone number and hours of operation. Please do not delay care.  [facility]  If you or a family member do not have a primary care provider, use the link below to schedule a visit and establish care. When you choose a [facility] primary care physician or advanced practice provider, you gain a long-term partner in health. Find a Primary Care Provider  Learn more about [facility]'s in-office and virtual care options: [facility] - Get Care Now  "

## 2024-12-21 NOTE — Telephone Encounter (Signed)
 I appreciate the update. His symptoms sound like a viral GI bug but this typically would not be recurrent or cyclical. I think it is a good idea for him to discuss with GI as well - can we make sure the referral is being processed per my last result note?  He can discuss the low iron with rheumatology but I don't think it has anything to do with the methotrexate.   Worth HERO. Kennyth, MD 12/21/2024 3:24 PM

## 2024-12-25 ENCOUNTER — Ambulatory Visit

## 2024-12-25 ENCOUNTER — Other Ambulatory Visit: Payer: Self-pay | Admitting: *Deleted

## 2024-12-25 ENCOUNTER — Ambulatory Visit: Admitting: Family Medicine

## 2024-12-25 ENCOUNTER — Encounter: Payer: Self-pay | Admitting: Family Medicine

## 2024-12-25 ENCOUNTER — Other Ambulatory Visit: Payer: Self-pay

## 2024-12-25 VITALS — BP 120/64 | HR 82 | Ht 70.0 in | Wt 192.0 lb

## 2024-12-25 DIAGNOSIS — E611 Iron deficiency: Secondary | ICD-10-CM

## 2024-12-25 DIAGNOSIS — M25512 Pain in left shoulder: Secondary | ICD-10-CM | POA: Diagnosis not present

## 2024-12-25 DIAGNOSIS — G8929 Other chronic pain: Secondary | ICD-10-CM | POA: Diagnosis not present

## 2024-12-25 NOTE — Patient Instructions (Addendum)
 Thank you for coming in today.   You received an injection today. Seek immediate medical attention if the joint becomes red, extremely painful, or is oozing fluid.   Please get an Xray today before you leave   Let me know if you would like a referral to physical therapy

## 2024-12-25 NOTE — Progress Notes (Signed)
 "        I, Leotis Batter, CMA acting as a scribe for Artist Lloyd, MD.  Jim Mathis. is a 74 y.o. male who presents to [facility] today for L shoulder pain. Pt was previously seen by Dr. Lloyd on 01/31/23 for R wrist pain and cervical radiculitis.  Today, pt c/o L shoulder pain x 1-1.5 months. Pt locates pain to anterior aspect of the shoulder. Limited ROM. Sx causing night disturbance. Radiating pain into the arm. N/T in the hand and fingers. Also notes weakness in the left arm and decreased grip strength. Has tried Aleve and Tylenol  with minimal relief. Has also tried Lyrica  with short-term relief. Has been seeing specialist for polymyalgia rheumatica. Has carpal tunnel surgery for left.   Radiates: L UE Aggravates: ROM, overhead reaching, lifting Treatments tried: tylenol , IBU, Lyrica   Pertinent review of systems: No fevers or chills  Relevant historical information: Hypertension and peripheral arterial disease.  Polymyalgia rheumatica currently in remission.   Exam:  BP 120/64   Pulse 82   Ht 5' 10 (1.778 m)   Wt 192 lb (87.1 kg)   SpO2 98%   BMI 27.55 kg/m  General: Well Developed, well nourished, and in no acute distress.   MSK: Left shoulder normal-appearing normal motion pain with abduction. Strength is reduced abduction 4/5.  Intact external and internal rotation. Positive Hawkins and Neer's test.  Positive empty can test. Negative Yergason's and speeds test.    Lab and Radiology Results  Procedure: Real-time Ultrasound Guided Injection of left shoulder subacromial bursa Device: Philips Affiniti 50G/GE Logiq Images permanently stored and available for review in PACS Ultrasound evaluation prior to injection reveals intact appearing rotator cuff tendons without significant retracted tear.  Moderate subacromial bursitis is present. Verbal informed consent obtained.  Discussed risks and benefits of procedure. Warned about infection,  bleeding, hyperglycemia damage to structures among others. Patient expresses understanding and agreement Time-out conducted.   Noted no overlying erythema, induration, or other signs of local infection.   Skin prepped in a sterile fashion.   Local anesthesia: Topical Ethyl chloride.   With sterile technique and under real time ultrasound guidance: 40 mg of Kenalog  and 2 mL of Marcaine injected into subacromial bursa. Fluid seen entering the bursa.   Completed without difficulty   Pain immediately resolved suggesting accurate placement of the medication.   Advised to call if fevers/chills, erythema, induration, drainage, or persistent bleeding.   Images permanently stored and available for review in the ultrasound unit.  Impression: Technically successful ultrasound guided injection.    X-ray images left shoulder obtained today personally and independently interpreted No significant glenohumeral DJD.  No acute fractures are visible. Await formal radiology review    Assessment and Plan: 74 y.o. male with left shoulder pain ongoing for about 2 months now.  Pain due to subacromial impingement and bursitis.  Plan for injection and home exercise program.  If not improving consider formal physical therapy.   PDMP not reviewed this encounter. Orders Placed This Encounter  Procedures   US  LIMITED JOINT SPACE STRUCTURES UP LEFT(NO LINKED CHARGES)    Reason for Exam (SYMPTOM  OR DIAGNOSIS REQUIRED):   left shoulder pain    Preferred imaging location?:   [facility] Shoulder Left    Standing Status:   Future    Number of Occurrences:   1    Expiration Date:   01/25/2025    Reason for Exam (  SYMPTOM  OR DIAGNOSIS REQUIRED):   left shoulder pain    Preferred imaging location?:   [facility]   No orders of the defined types were placed in this encounter.    Discussed warning signs or symptoms. Please see discharge instructions. Patient expresses  understanding.   The above documentation has been reviewed and is accurate and complete Artist Lloyd, M.D.   "

## 2024-12-26 ENCOUNTER — Other Ambulatory Visit: Payer: Self-pay | Admitting: *Deleted

## 2024-12-26 DIAGNOSIS — E611 Iron deficiency: Secondary | ICD-10-CM

## 2024-12-26 NOTE — Telephone Encounter (Signed)
 Future lab order  Request mail to patient

## 2024-12-31 ENCOUNTER — Ambulatory Visit (HOSPITAL_COMMUNITY)
Admission: RE | Admit: 2024-12-31 | Discharge: 2024-12-31 | Disposition: A | Discharge status: Home / Self Care | Source: Ambulatory Visit | Attending: Internal Medicine | Admitting: Internal Medicine

## 2024-12-31 DIAGNOSIS — R0600 Dyspnea, unspecified: Secondary | ICD-10-CM | POA: Diagnosis present

## 2024-12-31 LAB — ECHOCARDIOGRAM COMPLETE
AR max vel: 1.01 cm2
AV Area VTI: 1.12 cm2
AV Area mean vel: 0.93 cm2
AV Mean grad: 29 mmHg
AV Peak grad: 44.6 mmHg
Ao pk vel: 3.34 m/s
Area-P 1/2: 3.77 cm2
S' Lateral: 2.9 cm

## 2024-12-31 NOTE — Telephone Encounter (Signed)
 See note

## 2024-12-31 NOTE — Progress Notes (Signed)
 His echocardiogram shows that he has mild aortic stenosis.  This may be contributing some to the shortness of breath he has been experiencing.  It would probably be a good idea for him to see the cardiologist to further evaluate.  Please place referral if he is agreeable.

## 2025-01-02 ENCOUNTER — Ambulatory Visit: Payer: Self-pay | Admitting: Family Medicine

## 2025-01-02 NOTE — Progress Notes (Signed)
 Left shoulder x-ray shows a little bit of arthritis at the top of the shoulder.  The main shoulder joint does not have arthritis.

## 2025-01-07 ENCOUNTER — Encounter: Payer: Self-pay | Admitting: Cardiology

## 2025-01-07 ENCOUNTER — Ambulatory Visit: Attending: Cardiology | Admitting: Cardiology

## 2025-01-07 VITALS — BP 130/64 | HR 82 | Ht 70.0 in | Wt 189.0 lb

## 2025-01-07 DIAGNOSIS — I739 Peripheral vascular disease, unspecified: Secondary | ICD-10-CM

## 2025-01-07 DIAGNOSIS — R0989 Other specified symptoms and signs involving the circulatory and respiratory systems: Secondary | ICD-10-CM | POA: Diagnosis not present

## 2025-01-07 DIAGNOSIS — R0609 Other forms of dyspnea: Secondary | ICD-10-CM

## 2025-01-07 DIAGNOSIS — E7800 Pure hypercholesterolemia, unspecified: Secondary | ICD-10-CM

## 2025-01-07 DIAGNOSIS — I10 Essential (primary) hypertension: Secondary | ICD-10-CM

## 2025-01-07 DIAGNOSIS — I350 Nonrheumatic aortic (valve) stenosis: Secondary | ICD-10-CM

## 2025-01-07 MED ORDER — EZETIMIBE 10 MG PO TABS: 10.0000 mg | ORAL_TABLET | Freq: Every day | ORAL | 3 refills | Status: AC

## 2025-01-07 NOTE — Patient Instructions (Signed)
 Medication Instructions:  START  ezetimibe  (ZETIA ) 10 MG tablet         Take 1 tablet (10 mg total) by mouth daily   *If you need a refill on your cardiac medications before your next appointment, please call your pharmacy*  Lab Work: (4 weeks between 2/16 - 2/20) Lab Orders         Lipid Panel With LDL/HDL Ratio         Lipoprotein A (LPA)         Apo A1 + B + Ratio     If you have labs (blood work) drawn today and your tests are completely normal, you will receive your results only by: MyChart Message (if you have MyChart) OR A paper copy in the mail If you have any lab test that is abnormal or we need to change your treatment, we will call you to review the results.  Testing/Procedures: CARDIAC STRESS TEST  Your physician has requested that you have an Exercise Nuclear Stress Test. An exercise tolerance test is a test to check how your heart works during exercise. You will need to walk on a treadmill for this test. An electrocardiogram (ECG) will record your heartbeat when you are at rest and when you are exercising.    Please arrive 15 minutes prior to your appointment time for registration and insurance purposes.   The test will take approximately 45 minutes to complete.   How to prepare for your Exercise Stress Test: Do bring a list of your current medications with you.  If not listed below, you may take your medications as normal. Do wear comfortable clothes (no dresses or overalls) and walking shoes, tennis shoes preferred (no heels or open toed shoes are allowed) Do Not wear cologne, perfume, aftershave or lotions (deodorant is allowed). Do not drink or eat foods with caffeine for 24 hours before the test. (Chocolate, coffee, tea, or energy drinks) If you use an inhaler, bring it with you to the test. Do not smoke for 4 hours before the test.   CAROTID DUPLEX  Your physician has requested that you have a carotid duplex. This test is an ultrasound of the carotid  arteries in your neck. It looks at blood flow through these arteries that supply the brain with blood. Allow one hour for this exam. There are no restrictions or special instructions.    If these instructions are not followed, your test will have to be rescheduled.   If you cannot keep your appointment, please provide 24 hours notification to our office, to avoid a possible $50 charge to your account.   Follow-Up: At [facility], you and your health needs are our priority.  As part of our continuing mission to provide you with exceptional heart care, our providers are all part of one team.  This team includes your primary Cardiologist (physician) and Advanced Practice Providers or APPs (Physician Assistants and Nurse Practitioners) who all work together to provide you with the care you need, when you need it.  Your next appointment:   2 month(s)  Provider:   [name], MD          We recommend signing up for the patient portal called MyChart.  Patients are able to view lab/test results, encounter notes, upcoming appointments, etc.  Non-urgent messages can be sent to your provider as well, go to [email or web address].

## 2025-01-07 NOTE — Progress Notes (Signed)
 " Cardiology Office Note:  .   Date:  01/07/2025  ID:  Jim Mathis., DOB October 12, 1951, MRN [id number] PCP: Kennyth Worth HERO, MD  [facility] Providers Cardiologist:  None   History of Present Illness: .   Jim Mathis. is a 74 y.o. male patient with dyslipidemia, hypertension, hyperglycemia, PAD with remote bilateral CIA stents (2007) and right SFA balloon angioplasty on 05/09/2024, polymyalgia rheumatica referred to me for evaluation of dyspnea on exertion and mild aortic stenosis noted by echocardiogram on 12/31/2024.  He has noticed gradually worsening dyspnea over the past 2 years. He still has mild symptoms of claudication and follows vascular surgery.    Discussed the use of AI scribe software for clinical note transcription with the patient, who gave verbal consent to proceed.  History of Present Illness Jim Mathis. Jim Mathis is a 74 year old male with aortic stenosis and peripheral arterial disease who presents with shortness of breath. He was referred by his primary care doctor for evaluation of aortic stenosis and shortness of breath.  He has had progressive exertional shortness of breath for the past 1 to 2 years. As an avid hiker, he notes reduced exercise tolerance, with the need for more frequent rest stops due to shortness of breath and leg pain. He denies chest pain with exertion.  He has peripheral arterial disease with iliac angioplasty and stents placed in 2007. He feels one stent may be failing, with persistent exertional leg pain limiting walking. He last saw his vascular specialist about 2 months ago and has a follow-up planned.  He takes atorvastatin , increased from 40 mg to 80 mg on 12/06/2024, clopidogrel  75 mg daily, and amlodipine  10 mg daily. He has iron deficiency anemia under monitoring, with follow-up planned in 3 to 6 months.  He is a former smoker who quit about 30 years ago. Family history is notable for severe circulatory disease in his  grandfather, who lost his legs and was a heavy smoker.  Cardiac Studies relevent.    Cardiac Studies & Procedures   ______________________________________________________________________________________________ ECHOCARDIOGRAM COMPLETE 12/31/2024 1. Left ventricular ejection fraction, by estimation, is 60 to 65%. Left ventricular ejection fraction by 3D volume is 64 %. The left ventricle has normal function. The left ventricle has no regional wall motion abnormalities. Left ventricular diastolic parameters are consistent with Grade I diastolic dysfunction (impaired relaxation). The average left ventricular global longitudinal strain is -19.6 %. The global longitudinal strain is normal. 2. Right ventricular systolic function is normal. The right ventricular size is normal. There is normal pulmonary artery systolic pressure. The estimated right ventricular systolic pressure is 16.0 mmHg. 3. The mitral valve is grossly normal. Trivial mitral valve regurgitation. 4. The aortic valve is calcified. Aortic valve regurgitation is not visualized. Moderate aortic valve stenosis. Aortic valve area, by VTI measures 1.12 cm. Aortic valve mean gradient measures 29.0 mmHg. Aortic valve Vmax measures 3.34 m/s. Peak gradient 44.6 mmHg, DI 0.32. 5. The inferior vena cava is normal in size with greater than 50% respiratory variability, suggesting right atrial pressure of 3 mmHg. ______________________________________________________________________________________________    EKG:   EKG Interpretation Date/Time:  Monday January 07 2025 11:47:43 EST Ventricular Rate:  74 PR Interval:  140 QRS Duration:  92 QT Interval:  362 QTC Calculation: 401 R Axis:   -48  Text Interpretation: EKG 01/07/2025: Normal sinus rhythm at rate of 74 bpm, left intrafascicular block.  No evidence of ischemia.  No change from 05/09/2024. Confirmed by Janiel Derhammer, Jagadeesh ([id number])  on 01/07/2025 11:50:30 AM  Labs   Lab Results  Component  Value Date   CHOL 185 12/06/2024   HDL 60.10 12/06/2024   LDLCALC 103 (H) 12/06/2024   TRIG 109.0 12/06/2024   CHOLHDL 3 12/06/2024   No results found for: LIPOA  Recent Labs    05/09/24 1018 05/11/24 0855 12/06/24 1025  NA 139 138 138  K 4.4 4.0 4.6  CL 105 104 103  CO2  --  26 26  GLUCOSE 98 87 83  BUN 15 12 16   CREATININE 1.00 0.93 0.86  CALCIUM   --  9.4 9.4    Lab Results  Component Value Date   ALT 44 12/06/2024   AST 39 (H) 12/06/2024   ALKPHOS 80 12/06/2024   BILITOT 0.4 12/06/2024      Latest Ref Rng & Units 12/11/2024   10:16 AM 12/06/2024   10:25 AM 05/11/2024    8:55 AM  CBC  WBC 4.0 - 10.5 K/uL 6.4  5.7  7.0   Hemoglobin 13.0 - 17.0 g/dL 87.3  87.6  86.4   Hematocrit 39.0 - 52.0 % 38.8  38.7  40.9   Platelets 150.0 - 400.0 K/uL 242.0  248.0  242.0    Lab Results  Component Value Date   HGBA1C 5.6 12/06/2024    Lab Results  Component Value Date   TSH 1.99 12/06/2024    ROS  Review of Systems  Cardiovascular:  Positive for claudication (right calf) and dyspnea on exertion. Negative for chest pain and leg swelling.   Physical Exam:   VS:  BP 130/64 (BP Location: Left Arm, Patient Position: Sitting, Cuff Size: Normal)   Pulse 82   Ht 5' 10 (1.778 m)   Wt 189 lb (85.7 kg)   SpO2 97%   BMI 27.12 kg/m    Wt Readings from Last 3 Encounters:  01/07/25 189 lb (85.7 kg)  12/25/24 192 lb (87.1 kg)  12/06/24 192 lb (87.1 kg)    BP Readings from Last 3 Encounters:  01/07/25 130/64  12/25/24 120/64  12/06/24 132/78   Physical Exam Neck:     Vascular: No JVD.  Cardiovascular:     Rate and Rhythm: Normal rate and regular rhythm.     Pulses:          Carotid pulses are  on the right side with bruit and  on the left side with bruit.      Popliteal pulses are 1+ on the right side and 2+ on the left side.       Dorsalis pedis pulses are 0 on the right side and 1+ on the left side.       Posterior tibial pulses are 0 on the right side and 1+ on  the left side.     Heart sounds: S1 normal and S2 normal. Murmur heard.     Midsystolic murmur is present with a grade of 3/6 at the upper right sternal border radiating to the neck.     No gallop.     Comments: CRT < 2 Sec Pulmonary:     Effort: Pulmonary effort is normal.     Breath sounds: Normal breath sounds.  Abdominal:     General: Bowel sounds are normal.     Palpations: Abdomen is soft.  Musculoskeletal:     Right lower leg: No edema.     Left lower leg: No edema.    ASSESSMENT AND PLAN: .      ICD-10-CM  1. Dyspnea on exertion  R06.09 EKG 12-Lead    Cardiac Stress Test: Informed Consent Details: Physician/Practitioner Attestation; Transcribe to consent form and obtain patient signature    MYOCARDIAL PERFUSION IMAGING    CANCELED: NM PET CT CARDIAC PERFUSION MULTI W/ABSOLUTE BLOODFLOW    2. Nonrheumatic aortic valve stenosis  I35.0 EKG 12-Lead    3. Primary hypertension  I10 EKG 12-Lead    4. Pure hypercholesterolemia  E78.00 EKG 12-Lead    ezetimibe  (ZETIA ) 10 MG tablet    Lipid Panel With LDL/HDL Ratio    Lipoprotein A (LPA)    Apo A1 + B + Ratio    5. Bilateral carotid bruits  R09.89 VAS US  CAROTID    6. Claudication in peripheral vascular disease  I73.9 MYOCARDIAL PERFUSION IMAGING     Assessment & Plan  Dyspnea on exertion Gradual onset over the past 1-2 years. Likely multifactorial, related to peripheral arterial disease and potential cardiac issues. No recent cardiac stress test in the last five years. Plan to evaluate for cardiac causes with a nuclear stress test. - Ordered exercise nuclear stress test to evaluate for cardiac causes of dyspnea - LV function is normal by recent echocardiogram, aortic stenosis is only mild hence need to exclude significant coronary artery disease in high risk individual.  Nonrheumatic aortic valve stenosis Mild aortic stenosis identified on echocardiogram. Not considered the primary cause of dyspnea on exertion. -  Continue to monitor aortic stenosis  Peripheral arterial disease with claudication Chronic peripheral arterial disease with claudication, exacerbated by exertion. Previous angioplasty and stenting in 2007. Current symptoms include leg pain during walking, limiting physical activity. - Continue clopidogrel  75 mg once daily - Discontinued aspirin  due to anemia and risk of gastrointestinal bleeding  Primary hypertension Hypertension well controlled on amlodipine  10 mg once daily. Consideration for future addition of an ACE inhibitor or ARB for cardiac protection. - Continue amlodipine  10 mg once daily - Will consider adding Exforge (valsartan/amlodipine ) at next visit for additional cardiac protection  Pure hypercholesterolemia Hypercholesterolemia with LDL at 103 mg/dL. Current treatment includes atorvastatin  80 mg, increased from 40 mg in December. Goal is to reduce LDL to 55-60 mg/dL. Addition of ezetimibe  (Zetia ) to enhance cholesterol reduction. Discussed potential side effects of ezetimibe , including gastrointestinal gurgling, which typically resolves after a few days. - Added ezetimibe  (Zetia ) to current atorvastatin  regimen - Will recheck cholesterol levels in 4-6 weeks  Bilateral carotid bruits Presence of bilateral carotid bruits, indicating possible carotid artery disease. - Ordered carotid artery duplex ultrasound   Follow up: 2 months for follow-up of stress test and labs specifically lipids.  Signed,  [name], MD, [facility] 01/07/2025, 12:06 PM [facility] [address] [place], [place] [address] Phone: [phone]. Fax:  [phone]  "

## 2025-01-10 ENCOUNTER — Encounter (HOSPITAL_COMMUNITY): Payer: Self-pay

## 2025-01-10 ENCOUNTER — Telehealth (HOSPITAL_COMMUNITY): Payer: Self-pay | Admitting: *Deleted

## 2025-01-10 NOTE — Telephone Encounter (Signed)
 Spoke to patient about the rescheduling of his Stress TEST which is now scheduled for 01/11/25 at 12:45. Patient is aware and will be here on tomorrow.

## 2025-01-11 ENCOUNTER — Other Ambulatory Visit (HOSPITAL_COMMUNITY): Payer: Self-pay | Admitting: Cardiology

## 2025-01-11 ENCOUNTER — Telehealth: Payer: Self-pay

## 2025-01-11 ENCOUNTER — Encounter (HOSPITAL_COMMUNITY): Payer: Self-pay

## 2025-01-11 ENCOUNTER — Other Ambulatory Visit: Payer: Self-pay | Admitting: Cardiology

## 2025-01-11 ENCOUNTER — Encounter: Payer: Self-pay | Admitting: Gastroenterology

## 2025-01-11 ENCOUNTER — Ambulatory Visit: Admitting: Gastroenterology

## 2025-01-11 ENCOUNTER — Ambulatory Visit (HOSPITAL_COMMUNITY)
Admission: RE | Admit: 2025-01-11 | Discharge: 2025-01-11 | Disposition: A | Source: Ambulatory Visit | Attending: Cardiovascular Disease | Admitting: Cardiovascular Disease

## 2025-01-11 ENCOUNTER — Ambulatory Visit (HOSPITAL_COMMUNITY)
Admission: RE | Admit: 2025-01-11 | Discharge: 2025-01-11 | Disposition: A | Source: Ambulatory Visit | Attending: Cardiology | Admitting: Cardiology

## 2025-01-11 VITALS — BP 126/70 | HR 77 | Ht 70.0 in | Wt 187.0 lb

## 2025-01-11 DIAGNOSIS — R079 Chest pain, unspecified: Secondary | ICD-10-CM

## 2025-01-11 DIAGNOSIS — R0989 Other specified symptoms and signs involving the circulatory and respiratory systems: Secondary | ICD-10-CM

## 2025-01-11 DIAGNOSIS — T39395A Adverse effect of other nonsteroidal anti-inflammatory drugs [NSAID], initial encounter: Secondary | ICD-10-CM

## 2025-01-11 DIAGNOSIS — I739 Peripheral vascular disease, unspecified: Secondary | ICD-10-CM

## 2025-01-11 DIAGNOSIS — Z860100 Personal history of colon polyps, unspecified: Secondary | ICD-10-CM

## 2025-01-11 DIAGNOSIS — K2960 Other gastritis without bleeding: Secondary | ICD-10-CM | POA: Diagnosis not present

## 2025-01-11 DIAGNOSIS — R0609 Other forms of dyspnea: Secondary | ICD-10-CM

## 2025-01-11 DIAGNOSIS — D509 Iron deficiency anemia, unspecified: Secondary | ICD-10-CM | POA: Diagnosis not present

## 2025-01-11 LAB — MYOCARDIAL PERFUSION IMAGING
Angina Index: 0
Duke Treadmill Score: 7
Exercise duration (min): 6 min
Exercise duration (sec): 30 s
LV dias vol: 93 mL (ref 62–150)
LV sys vol: 37 mL
MPHR: 146 {beats}/min
Nuc Stress EF: 60 %
Peak HR: 142 {beats}/min
Percent HR: 97 %
Rest HR: 68 {beats}/min
Rest Nuclear Isotope Dose: 10.2 mCi
SDS: 2
SRS: 0
SSS: 2
ST Depression (mm): 0 mm
Stress Nuclear Isotope Dose: 31.7 mCi
TID: 1.11

## 2025-01-11 MED ORDER — TECHNETIUM TC 99M TETROFOSMIN IV KIT: 10.2000 | PACK | Freq: Once | INTRAVENOUS | Status: DC | PRN

## 2025-01-11 MED ORDER — PANTOPRAZOLE SODIUM 40 MG PO TBEC: 40.0000 mg | DELAYED_RELEASE_TABLET | Freq: Every day | ORAL | 3 refills | Status: DC

## 2025-01-11 MED ORDER — TECHNETIUM TC 99M TETROFOSMIN IV KIT
32.3000 | PACK | Freq: Once | INTRAVENOUS | Status: AC | PRN
  Administered 2025-01-11: 32.3 via INTRAVENOUS

## 2025-01-11 MED ORDER — PANTOPRAZOLE SODIUM 40 MG PO TBEC: 40.0000 mg | DELAYED_RELEASE_TABLET | Freq: Every day | ORAL | 1 refills | Status: AC

## 2025-01-11 MED ORDER — TECHNETIUM TC 99M TETROFOSMIN IV KIT
10.9000 | PACK | Freq: Once | INTRAVENOUS | Status: AC | PRN
  Administered 2025-01-11: 10.9 via INTRAVENOUS

## 2025-01-11 NOTE — Progress Notes (Signed)
 "  Chief Complaint:iron deficiency anemia Primary GI Doctor:Dr. San  HPI:  Patient is a  74  year old male patient with past medical history of hypertension, history of diverticulosis of colon without hemorrhage, s/p colectomy 2018, hyperlipidemia, PAD, and BPH, who was referred to me by Kennyth Worth HERO, MD on 06/05/24 for a evaluation of nausea, vomiting, and abd pain .   Patient was last seen in GI office on 05/13/2023 by Alan, [place] for bloating and frequent stools.   01/07/25 cardiology follow-up, gradual dyspnea on exertion. Ordered stress test and carotid US . Reviewed both today. Doing both test today.   05/11/24 pt presented to PCP with abd pain and diarrhea with concerns for diverticulitis. CTAP ordered and patient given Augmentin . Cdiff stool negative.  Interval History Patient last seen in GI office on 08/08/24 by myself.  Patient presents for evaluation of iron deficiency anemia. No known history of iron deficiency. Reports he went to see his PCP for fatigue and lab work was ordered which revealed iron deficiency.  Patient has been on oral iron supplement 1 tab po daily for about a month.   No blood in stool. No hematemesis.  Follow-up labwork with his PCP to recheck iron in 3 mths  Patient was recently seen by cardiologist and he discontinued aspirin  due to anemia and risk of gastrointestinal bleeding. Patient still on Plavix  for PVD.  Denies any current GI issues. Reports he had a stomach bug few weeks ago, but has fully recovered.   Wt Readings from Last 3 Encounters:  01/11/25 189 lb (85.7 kg)  01/11/25 187 lb (84.8 kg)  01/07/25 189 lb (85.7 kg)    Past Medical History:  Diagnosis Date   Allergy 2022   getting more severe each year   Anxiety 30 years   anxiety while sleeping   Arthritis approx. 2016   lower back and neck, leads to headaches.   BPH with obstruction/lower urinary tract symptoms    Carotid stenosis, right    per pt was told he has some  stenosis but not enough for surgery;   in epic under media ,  carotid ultrasound result right ICA 50-69% and incidental finding thyroid  nodule   Cervicalgia    ED (erectile dysfunction)    History of colon resection    per pt 12 hours after partial knee arthroplasty 2017,  s/p partial colectomy was possible from divertiulitis   Hyperlipidemia    Hypertension    Migraines    PAD (peripheral artery disease) 08-14-2019  per pt when he walks (walks 2 miles a day) right foot gets numb but recovers quickly and when he rides his bike left hand gets numb by recovers quickly ;   denies claudication or swelling   per pt s/p  stenting to bilateral lower legs in [place]  one in 2003 and the other 2007,  no follow up since moved to [place]   Wears glasses     Past Surgical History:  Procedure Laterality Date   ABDOMINAL AORTOGRAM N/A 05/09/2024   Procedure: ABDOMINAL AORTOGRAM;  Surgeon: Lanis Fonda BRAVO, MD;  Location: [facility] INVASIVE CV LAB;  Service: Cardiovascular;  Laterality: N/A;   COLON SURGERY  2017   sigmoid colon perforation   COLOSTOMY REVERSAL  02-21-2017     [place], [place] (OR record scanned in epic)   w/ sigmoid and descending colectomy with appendectomy   CYSTOSCOPY WITH INJECTION N/A 12/19/2019   Procedure: CYSTOSCOPY WITH INJECTION;  Surgeon: Alvaro Hummer, MD;  Location: [facility]  [place];  Service: Urology;  Laterality: N/A;   CYSTOSCOPY WITH URETHRAL DILATATION N/A 12/19/2019   Procedure: CYSTOSCOPY WITH URETHRAL DILATATION;  Surgeon: Alvaro Hummer, MD;  Location: [facility];  Service: Urology;  Laterality: N/A;  45 MINS   ENDOVASCULAR STENT INSERTION  2003 and 2007  --- both done in [place]    bilateral legs   LOWER EXTREMITY ANGIOGRAPHY Right 05/09/2024   Procedure: Lower Extremity Angiography;  Surgeon: Lanis Fonda BRAVO, MD;  Location: [facility] INVASIVE CV LAB;  Service: Cardiovascular;  Laterality: Right;   LOWER EXTREMITY INTERVENTION Right 05/09/2024   Procedure: LOWER  EXTREMITY INTERVENTION;  Surgeon: Lanis Fonda BRAVO, MD;  Location: [facility] INVASIVE CV LAB;  Service: Cardiovascular;  Laterality: Right;   PARTIAL COLECTOMY  09-04-2016  in [place], [place]   w/  colostomy for perferated divierticulitis   PARTIAL KNEE ARTHROPLASTY Left 2017   PERIPHERAL INTRAVASCULAR LITHOTRIPSY Right 05/09/2024   Procedure: PERIPHERAL INTRAVASCULAR LITHOTRIPSY;  Surgeon: Lanis Fonda BRAVO, MD;  Location: [facility] INVASIVE CV LAB;  Service: Cardiovascular;  Laterality: Right;   PILONIDAL CYST EXCISION     TOTAL KNEE ARTHROPLASTY Right 2012   TRANSURETHRAL RESECTION OF PROSTATE N/A 08/15/2019   Procedure: TRANSURETHRAL RESECTION OF THE PROSTATE (TURP);  Surgeon: Alvaro Hummer, MD;  Location: [facility];  Service: Urology;  Laterality: N/A;    Current Outpatient Medications  Medication Sig Dispense Refill   alendronate (FOSAMAX) 70 MG tablet TAKE 1 TABLET 30 MINS BEFORE THE FIRST MEAL, BEVERAGE OR MEDICINE OF THE DAY WITH WATER ONCE WEEKLY; Duration: 90     amLODipine  (NORVASC ) 10 MG tablet Take 1 tablet (10 mg total) by mouth daily. 90 tablet 1   atorvastatin  (LIPITOR) 80 MG tablet Take 1 tablet (80 mg total) by mouth daily. 90 tablet 3   Calcium  Carbonate-Vit D-Min (CALCIUM  1200 PO) Take 1,200 mg by mouth daily.     clopidogrel  (PLAVIX ) 75 MG tablet Take 1 tablet (75 mg total) by mouth daily. 30 tablet 11   cyanocobalamin  (VITAMIN B12) 1000 MCG tablet Take 1,000 mcg by mouth daily.     doxepin  (SINEQUAN ) 25 MG capsule Take 1 capsule (25 mg total) by mouth daily. 90 capsule 1   ezetimibe  (ZETIA ) 10 MG tablet Take 1 tablet (10 mg total) by mouth daily. 90 tablet 3   finasteride  (PROSCAR ) 5 MG tablet Take 1 tablet (5 mg total) by mouth at bedtime. 90 tablet 3   fluticasone (FLONASE) 50 MCG/ACT nasal spray Place 1 spray into both nostrils daily as needed for allergies or rhinitis.     folic acid  (FOLVITE ) 1 MG tablet Take 1 mg by mouth daily.     loratadine (CLARITIN) 10 MG  tablet Take 10 mg by mouth daily.     methotrexate (RHEUMATREX) 2.5 MG tablet Take 15 mg by mouth once a week. Tuesday     naphazoline-pheniramine (ALLERGY EYE) 0.025-0.3 % ophthalmic solution Place 1 drop into both eyes daily as needed for allergies.     tadalafil  (CIALIS ) 20 MG tablet Take 1 tablet (20 mg total) by mouth daily. 90 tablet 3   triamcinolone  ointment (KENALOG ) 0.5 % APPLY TO AFFECTED AREA(S) THREE TIMES A DAY 30 g 3   vitamin E 180 MG (400 UNITS) capsule Take 400 Units by mouth daily.     pantoprazole (PROTONIX) 40 MG tablet Take 1 tablet (40 mg total) by mouth daily. 30 tablet 1   No current facility-administered medications for this visit.    Allergies as  of 01/11/2025 - Review Complete 01/11/2025  Allergen Reaction Noted   Pollen extract Cough, Itching, and Other (See Comments) 01/20/2022    Family History  Problem Relation Age of Onset   Alzheimer's disease Mother    Arthritis Mother    Hyperlipidemia Mother    Hypertension Mother    Lung cancer Father        non smoker   Cancer Father    COPD Sister    Heart failure Sister    ALS Brother    Depression Brother    Early death Brother    Heart disease Sister    Colon cancer Neg Hx    Esophageal cancer Neg Hx    Pancreatic cancer Neg Hx    Stomach cancer Neg Hx    Liver disease Neg Hx    Rectal cancer Neg Hx     Review of Systems:    Constitutional: No weight loss, fever, chills, weakness or fatigue HEENT: Eyes: No change in vision               Ears, Nose, Throat:  No change in hearing or congestion Skin: No rash or itching Cardiovascular: No chest pain, chest pressure or palpitations   Respiratory: No SOB or cough Gastrointestinal: See HPI and otherwise negative Genitourinary: No dysuria or change in urinary frequency Neurological: No headache, dizziness or syncope Musculoskeletal: No new muscle or joint pain Hematologic: No bleeding or bruising Psychiatric: No history of depression or  anxiety    Physical Exam:  Vital signs: BP 126/70   Pulse 77   Ht 5' 10 (1.778 m)   Wt 187 lb (84.8 kg)   BMI 26.83 kg/m   Constitutional:   Pleasant male appears to be in NAD, Well developed, Well nourished, alert and cooperative Throat: Oral cavity and pharynx without inflammation, swelling or lesion.  Respiratory: Respirations even and unlabored. Lungs clear to auscultation bilaterally.   No wheezes, crackles, or rhonchi.  Cardiovascular: Normal S1, S2. Regular rate and rhythm. No peripheral edema, cyanosis or pallor.  Gastrointestinal:  Soft, nondistended, nontender. No rebound or guarding. Normal bowel sounds. No appreciable masses or hepatomegaly. Rectal:  Not performed.  Msk:  Symmetrical without gross deformities. Without edema, no deformity or joint abnormality.  Neurologic:  Alert and  oriented x4;  grossly normal neurologically.  Skin:   Dry and intact without significant lesions or rashes.  RELEVANT LABS AND IMAGING: CBC    Latest Ref Rng & Units 12/11/2024   10:16 AM 12/06/2024   10:25 AM 05/11/2024    8:55 AM  CBC  WBC 4.0 - 10.5 K/uL 6.4  5.7  7.0   Hemoglobin 13.0 - 17.0 g/dL 87.3  87.6  86.4   Hematocrit 39.0 - 52.0 % 38.8  38.7  40.9   Platelets 150.0 - 400.0 K/uL 242.0  248.0  242.0      CMP     Latest Ref Rng & Units 12/06/2024   10:25 AM 05/11/2024    8:55 AM 05/09/2024   10:18 AM  CMP  Glucose 70 - 99 mg/dL 83  87  98   BUN 6 - 23 mg/dL 16  12  15    Creatinine 0.40 - 1.50 mg/dL 9.13  9.06  8.99   Sodium 135 - 145 mEq/L 138  138  139   Potassium 3.5 - 5.1 mEq/L 4.6  4.0  4.4   Chloride 96 - 112 mEq/L 103  104  105   CO2 19 - 32 mEq/L  26  26    Calcium  8.4 - 10.5 mg/dL 9.4  9.4    Total Protein 6.0 - 8.3 g/dL 6.7  6.9    Total Bilirubin 0.2 - 1.2 mg/dL 0.4  0.5    Alkaline Phos 39 - 117 U/L 80  78    AST 5 - 37 U/L 39  13    ALT 3 - 53 U/L 44  13       Lab Results  Component Value Date   TSH 1.99 12/06/2024   Lab Results  Component  Value Date   IRON 76 12/11/2024   TIBC 399 12/11/2024   FERRITIN 12 (L) 12/11/2024  11/2024 labs show: b 12 965, folate >23.7  Echo 12/25- Left ventricular ejection fraction, by estimation, is 60 to 65%.    07/11/23 colonoscopy , recall 5 years - Three 3 to 5 mm polyps in the transverse colon and in the ascending colon, removed with a cold snare. Resected and retrieved. - Patent end- to- side colo- colonic anastomosis, characterized by healthy appearing mucosa. - One 4 mm polyp at the recto- sigmoid colon, removed with a cold snare. Resected and retrieved. - Diverticulosis in the entire examined colon. - Normal mucosa in the entire examined colon. Biopsied. - Non- bleeding internal hemorrhoids. Path: Surgical [P], colon, transverse and ascending, polyp (3) TUBULAR ADENOMA, 4 FRAGMENTS NEGATIVE FOR HIGH-GRADE DYSPLASIA AND CARCINOMA 2. Surgical [P], right colon bx BENIGN COLONIC MUCOSA WITH NO DIAGNOSTIC ABNORMALITY 3. Surgical [P], left colon bx BENIGN COLONIC MUCOSA WITH NO DIAGNOSTIC ABNORMALITIES 4. Surgical [P], colon, rectum, polyp (1) TUBULAR ADENOMA NEGATIVE FOR HIGH-GRADE DYSPLASIA AND CARCINOMA  01/2017 colonoscopy in mid [facility] [place]  showed moderate diverticulosis, fair/good prep recall 5 years. Prior to reversal.   2022 CT abdomen pelvis without contrast for nausea and abdominal pain changes consistent with partial small bowel obstruction in the mid to distal jejunum felt to be related to adhesions, more distal small bowel is within normal limits no other acute abnormality, aortic atherosclerosis   Imaging: 05/11/24 CTAP -IMPRESSION: 1. Mild wall thickening throughout the distal small bowel and cecum, which Jim Mathis be due to underdistension. Alternatively, this could represent changes of an infectious or inflammatory enterocolitis. 2. Asymmetric enhancement along the lateral aspect of the left prostate gland. Correlation with serum PSA and other labs recommended to  exclude underlying prostatitis. 3. Total colonic diverticulosis. No changes of acute diverticulitis. 4. Enteric contrast in the distal esophagus, possibly due to incomplete emptying or gastroesophageal reflux disease.   Assessment: Encounter Diagnoses  Name Primary?   Iron deficiency anemia, unspecified iron deficiency anemia type    NSAID induced gastritis Yes   74 year old male patient who presents with new onset iron deficiency anemia.  Patient denies any overt bleeding.  Patient was on Plavix  and baby aspirin  for PVD.  His cardiologist discontinued the baby aspirin  a week ago due to concerns for possible cause of anemia.  Will go ahead and start patient on pantoprazole 40 mg p.o. daily as well as recommend upper GI endoscopy to evaluate and rule out peptic ulcer disease and/or gastropathy.  Recommend avoiding NSAIDs.  Will need vascular clearance for Plavix .  Will also need cardiac clearance pending evaluation he is completing today with carotid ultrasound and stress test. Hgb stable at 12.6.  Last colonoscopy 06/2023. Recall 5 years.  Plan: - start pantoprazole 40 mg po daily -No NSAIDs -Schedule EGD in [facility] with Dr. San. The risks and benefits of EGD with possible biopsies  and esophageal dilation were discussed with the patient who agrees to proceed. -need cardiac clearance pending stress test and carotid - need vascular clearance for Plavix  -Hold Plavix   5 days before procedure - will instruct when and how to resume after procedure. Risks and benefits of procedure including bleeding, perforation, infection, missed lesions, medication reactions and possible hospitalization or surgery if complications occur explained. Additional rare but real risk of cardiovascular event such as heart attack or ischemia/infarct of other organs off Plavix  explained and need to seek urgent help if this occurs. Will communicate by phone or EMR with patient's prescribing provider that to confirm holding  Plavix  is reasonable in this case.     Thank you for the courtesy of this consult. Please call me with any questions or concerns.   Adelyne Marchese, FNP-C [facility] 01/11/2025, 12:12 PM  Cc: Kennyth Worth HERO, MD  "

## 2025-01-11 NOTE — Patient Instructions (Addendum)
 Iron deficiency anemia Start Pantoprazole 40 mg po daily , take 1 tab 30-45 mins before before breakfast Recommend GERD diet No NSAIDs (motrin, ibuprofen, aleve) Advised to go to the ER if there is any severe weakness, severe abdominal pain, vomit blood, dark red blood in your bowel movement, shortness of breath or chest pain.    You have been scheduled for an endoscopy. Please follow written instructions given to you at your visit today.  If you use inhalers (even only as needed), please bring them with you on the day of your procedure.  If you take any of the following medications, they will need to be adjusted prior to your procedure:   DO NOT TAKE 7 DAYS PRIOR TO TEST- Trulicity (dulaglutide) Ozempic, Wegovy (semaglutide) Mounjaro, Zepbound (tirzepatide) Bydureon Bcise (exanatide extended release)  DO NOT TAKE 1 DAY PRIOR TO YOUR TEST Rybelsus (semaglutide) Adlyxin (lixisenatide) Victoza (liraglutide) Byetta (exanatide) ___________________________________________________________________________  Due to recent changes in healthcare laws, you may see the results of your imaging and laboratory studies on MyChart before your provider has had a chance to review them.  We understand that in some cases there may be results that are confusing or concerning to you. Not all laboratory results come back in the same time frame and the provider may be waiting for multiple results in order to interpret others.  Please give us  48 hours in order for your provider to thoroughly review all the results before contacting the office for clarification of your results.   _______________________________________________________  If your blood pressure at your visit was 140/90 or greater, please contact your primary care physician to follow up on this.  _______________________________________________________  If you are age 75 or older, your body mass index should be between 23-30. Your Body mass index  is 26.83 kg/m. If this is out of the aforementioned range listed, please consider follow up with your Primary Care Provider.  If you are age 75 or younger, your body mass index should be between 19-25. Your Body mass index is 26.83 kg/m. If this is out of the aformentioned range listed, please consider follow up with your Primary Care Provider.   ________________________________________________________  The [facility] providers would like to encourage you to use MYCHART to communicate with providers for non-urgent requests or questions.  Due to long hold times on the telephone, sending your provider a message by [facility] may be a faster and more efficient way to get a response.  Please allow 48 business hours for a response.  Please remember that this is for non-urgent requests.  _______________________________________________________  [facility] is using a team-based approach to care.  Your team is made up of your doctor and two to three APPS. Our APPS (Nurse Practitioners and Physician Assistants) work with your physician to ensure care continuity for you. They are fully qualified to address your health concerns and develop a treatment plan. They communicate directly with your gastroenterologist to care for you. Seeing the Advanced Practice Practitioners on your physician's team can help you by facilitating care more promptly, often allowing for earlier appointments, access to diagnostic testing, procedures, and other specialty referrals.   Thank you for trusting me with your gastrointestinal care. Deanna May, FNP-C

## 2025-01-11 NOTE — Telephone Encounter (Signed)
" °  Jim Mathis. May 30, 1951 [id number]  01/11/25    Dear [name], MD:  We have scheduled the above named patient for a(n) endoscopic procedure. Our records show that (s)he is on anticoagulation therapy.  Please advise as to whether the patient may come off their therapy of Plavix  5 days prior to their procedure which is scheduled for 02/06/25.  Please route your response to Karna Louder, RMA or fax response to [phone].  Sincerely,    [facility]   "

## 2025-01-11 NOTE — Addendum Note (Signed)
 Encounter addended by: Hornowski Kubak, Jaramie Bastos on: 01/11/2025 1:14 PM  Actions taken: Imaging Exam begun

## 2025-01-11 NOTE — Telephone Encounter (Signed)
" ° °  Patient Name: Jim Mathis.  DOB: 04/19/1951 MRN: [id number]  Primary Cardiologist: [name], MD  Chart reviewed as part of pre-operative protocol coverage. Pre-op clearance already addressed by colleagues in earlier phone notes. To summarize recommendations:  - Plavix  prescribed by a Vascular Surgery provider therefore recommendations for holding deferred to prescribing provider.   Will route this bundled recommendation to requesting provider via Epic fax function and remove from pre-op pool. Please call with questions.  Mardy KATHEE Pizza, FNP 01/11/2025, 10:08 AM  "

## 2025-01-11 NOTE — Telephone Encounter (Signed)
 [facility] Pre-operative Risk Assessment     Request for surgical clearance:     Endoscopy Procedure  What type of surgery is being performed?     Endoscopic  When is this surgery scheduled?     02/06/25  What type of clearance is required ?   Pharmacy  Are there any medications that need to be held prior to surgery and how long? Plavix  5 days  Practice name and name of physician performing surgery?      [facility]  What is your office phone and fax number?      Phone- [phone]  Fax- [phone]  Anesthesia type (None, local, MAC, general) ?       MAC   Please route your response to [place], [place]

## 2025-01-11 NOTE — Telephone Encounter (Signed)
" °  Wisdom Rickey. Apr 28, 1951 [id number]  01/11/25    Dear Fonda CHARLENA Rim, MD:  We have scheduled the above named patient for a(n) endoscopic procedure. Our records show that (s)he is on anticoagulation therapy.  Please advise as to whether the patient may come off their therapy of Plavis 5 days prior to their procedure which is scheduled for 02/06/25.  Please route your response to Karna Vicci KRAFT or fax response to [phone].  Sincerely,     Gastroenterology   "

## 2025-01-12 ENCOUNTER — Ambulatory Visit: Payer: Self-pay | Admitting: Cardiology

## 2025-01-12 NOTE — Progress Notes (Signed)
"  Normal stress test.   "

## 2025-01-12 NOTE — Progress Notes (Signed)
 Carotid artery duplex 01/11/2025: Right ICA 40 to 59% stenosis. Left ICA 1 to 39% stenosis. Bilateral antegrade vertebral artery flow.  Normal flow hemodynamics in bilateral subclavian arteries.

## 2025-01-14 ENCOUNTER — Ambulatory Visit (HOSPITAL_COMMUNITY)

## 2025-01-14 ENCOUNTER — Encounter (HOSPITAL_COMMUNITY)

## 2025-01-15 ENCOUNTER — Telehealth: Payer: Self-pay

## 2025-01-15 NOTE — Telephone Encounter (Signed)
 [facility] Pre-operative Risk Assessment     Request for surgical clearance:     Endoscopy Procedure  What type of surgery is being performed?     endoscopic  When is this surgery scheduled?     02/06/25  What type of clearance is required ? Cardiac  Are there any medications that need to be held prior to surgery and how long? No, just cardiac clearance, PLEASE ADVISE  Practice name and name of physician performing surgery?      [facility]  What is your office phone and fax number?      Phone- [phone]  Fax- [phone]  Anesthesia type (None, local, MAC, general) ?       MAC   Please route your response to [place], [place]

## 2025-01-16 NOTE — Telephone Encounter (Addendum)
"  ° °  Patient Name: Jim Mathis.  DOB: 1951-03-01 MRN: [id number]  Primary Cardiologist: [name], MD  Chart reviewed as part of pre-operative protocol coverage. Patient was recently seen by Dr. Bergamo on 01/07/2025 for further evaluation of dyspnea on exertion. Recent Echo prior to this visit on  12/31/2024 showed LVEF of 60-65% and moderate aortic stenosis. However, per Dr. Bergamo, aortic stenosis mild not moderate. Myoview  was ordered to rule out obstructive CAD as cause of dyspnea. Myoview  on 01/11/2025 was low risk with no evidence of ischemia. Therefore, patient okay to proceed with endoscopy which is a low risk procedure.   I will route this recommendation to the requesting party and remove from pre-op pool.  Please call with questions.  Rucha Wissinger E Jayley Hustead, PA-C 01/16/2025, 8:34 AM  "

## 2025-02-06 ENCOUNTER — Encounter: Admitting: Gastroenterology

## 2025-03-07 ENCOUNTER — Encounter (HOSPITAL_COMMUNITY)

## 2025-03-07 ENCOUNTER — Ambulatory Visit: Admitting: Vascular Surgery

## 2025-03-12 ENCOUNTER — Ambulatory Visit: Admitting: Cardiology

## 2025-05-23 ENCOUNTER — Ambulatory Visit

## 2025-12-09 ENCOUNTER — Encounter: Admitting: Family Medicine
